# Patient Record
Sex: Female | Born: 1935 | Race: White | Hispanic: No | Smoking: Never smoker
Health system: Southern US, Community
[De-identification: ages and names within clinical notes are randomized; demographics above are authoritative.]

## PROBLEM LIST (undated history)

## (undated) DIAGNOSIS — K2100 Gastro-esophageal reflux disease with esophagitis, without bleeding: Secondary | ICD-10-CM

## (undated) DIAGNOSIS — N183 Chronic kidney disease, stage 3 unspecified: Secondary | ICD-10-CM

## (undated) DIAGNOSIS — G2581 Restless legs syndrome: Secondary | ICD-10-CM

## (undated) DIAGNOSIS — D126 Benign neoplasm of colon, unspecified: Secondary | ICD-10-CM

## (undated) DIAGNOSIS — E785 Hyperlipidemia, unspecified: Secondary | ICD-10-CM

## (undated) DIAGNOSIS — N390 Urinary tract infection, site not specified: Secondary | ICD-10-CM

## (undated) DIAGNOSIS — L659 Nonscarring hair loss, unspecified: Secondary | ICD-10-CM

## (undated) DIAGNOSIS — I10 Essential (primary) hypertension: Secondary | ICD-10-CM

## (undated) DIAGNOSIS — I428 Other cardiomyopathies: Secondary | ICD-10-CM

## (undated) DIAGNOSIS — D509 Iron deficiency anemia, unspecified: Secondary | ICD-10-CM

## (undated) DIAGNOSIS — K317 Polyp of stomach and duodenum: Secondary | ICD-10-CM

## (undated) DIAGNOSIS — K222 Esophageal obstruction: Secondary | ICD-10-CM

## (undated) DIAGNOSIS — I4891 Unspecified atrial fibrillation: Secondary | ICD-10-CM

## (undated) DIAGNOSIS — I509 Heart failure, unspecified: Secondary | ICD-10-CM

## (undated) DIAGNOSIS — K5730 Diverticulosis of large intestine without perforation or abscess without bleeding: Secondary | ICD-10-CM

## (undated) DIAGNOSIS — J45909 Unspecified asthma, uncomplicated: Secondary | ICD-10-CM

## (undated) DIAGNOSIS — R0602 Shortness of breath: Secondary | ICD-10-CM

## (undated) DIAGNOSIS — K219 Gastro-esophageal reflux disease without esophagitis: Secondary | ICD-10-CM

## (undated) DIAGNOSIS — M1990 Unspecified osteoarthritis, unspecified site: Secondary | ICD-10-CM

## (undated) DIAGNOSIS — Z950 Presence of cardiac pacemaker: Secondary | ICD-10-CM

## (undated) DIAGNOSIS — Z7901 Long term (current) use of anticoagulants: Secondary | ICD-10-CM

## (undated) HISTORY — DX: Diverticulosis of large intestine without perforation or abscess without bleeding: K57.30

## (undated) HISTORY — PX: CARDIAC DEFIBRILLATOR PLACEMENT: SHX171

## (undated) HISTORY — DX: Gastro-esophageal reflux disease with esophagitis: K21.0

## (undated) HISTORY — DX: Iron deficiency anemia, unspecified: D50.9

## (undated) HISTORY — PX: INSERT / REPLACE / REMOVE PACEMAKER: SUR710

## (undated) HISTORY — DX: Nonscarring hair loss, unspecified: L65.9

## (undated) HISTORY — DX: Polyp of stomach and duodenum: K31.7

## (undated) HISTORY — DX: Other cardiomyopathies: I42.8

## (undated) HISTORY — DX: Benign neoplasm of colon, unspecified: D12.6

## (undated) HISTORY — DX: Gastro-esophageal reflux disease with esophagitis, without bleeding: K21.00

## (undated) HISTORY — DX: Unspecified asthma, uncomplicated: J45.909

## (undated) HISTORY — DX: Unspecified osteoarthritis, unspecified site: M19.90

## (undated) HISTORY — DX: Heart failure, unspecified: I50.9

## (undated) HISTORY — DX: Restless legs syndrome: G25.81

## (undated) HISTORY — DX: Esophageal obstruction: K22.2

## (undated) HISTORY — DX: Unspecified atrial fibrillation: I48.91

## (undated) HISTORY — DX: Gastro-esophageal reflux disease without esophagitis: K21.9

## (undated) HISTORY — PX: JOINT REPLACEMENT: SHX530

---

## 1998-07-15 ENCOUNTER — Ambulatory Visit (HOSPITAL_COMMUNITY): Admission: RE | Admit: 1998-07-15 | Discharge: 1998-07-15 | Payer: Self-pay | Admitting: Cardiology

## 1998-12-29 ENCOUNTER — Other Ambulatory Visit: Admission: RE | Admit: 1998-12-29 | Discharge: 1998-12-29 | Payer: Self-pay | Admitting: Obstetrics and Gynecology

## 1999-01-19 ENCOUNTER — Ambulatory Visit (HOSPITAL_COMMUNITY): Admission: RE | Admit: 1999-01-19 | Discharge: 1999-01-19 | Payer: Self-pay | Admitting: Obstetrics and Gynecology

## 1999-01-19 ENCOUNTER — Encounter: Payer: Self-pay | Admitting: Obstetrics and Gynecology

## 1999-11-12 ENCOUNTER — Ambulatory Visit (HOSPITAL_COMMUNITY): Admission: RE | Admit: 1999-11-12 | Discharge: 1999-11-12 | Payer: Self-pay | Admitting: Obstetrics and Gynecology

## 1999-11-12 ENCOUNTER — Encounter (INDEPENDENT_AMBULATORY_CARE_PROVIDER_SITE_OTHER): Payer: Self-pay | Admitting: Specialist

## 1999-12-21 ENCOUNTER — Ambulatory Visit (HOSPITAL_COMMUNITY): Admission: RE | Admit: 1999-12-21 | Discharge: 1999-12-21 | Payer: Self-pay | Admitting: Obstetrics and Gynecology

## 1999-12-21 ENCOUNTER — Encounter: Payer: Self-pay | Admitting: Obstetrics and Gynecology

## 2000-03-13 HISTORY — PX: EYE SURGERY: SHX253

## 2002-02-21 ENCOUNTER — Encounter: Admission: RE | Admit: 2002-02-21 | Discharge: 2002-05-22 | Payer: Self-pay

## 2002-03-13 HISTORY — PX: TOTAL HIP ARTHROPLASTY: SHX124

## 2002-05-01 ENCOUNTER — Other Ambulatory Visit: Admission: RE | Admit: 2002-05-01 | Discharge: 2002-05-01 | Payer: Self-pay | Admitting: Obstetrics and Gynecology

## 2002-05-07 ENCOUNTER — Encounter: Payer: Self-pay | Admitting: Obstetrics and Gynecology

## 2002-05-07 ENCOUNTER — Ambulatory Visit (HOSPITAL_COMMUNITY): Admission: RE | Admit: 2002-05-07 | Discharge: 2002-05-07 | Payer: Self-pay | Admitting: Obstetrics and Gynecology

## 2002-07-16 ENCOUNTER — Encounter
Admission: RE | Admit: 2002-07-16 | Discharge: 2002-10-14 | Payer: Self-pay | Admitting: Physical Medicine & Rehabilitation

## 2003-01-10 ENCOUNTER — Inpatient Hospital Stay (HOSPITAL_COMMUNITY): Admission: EM | Admit: 2003-01-10 | Discharge: 2003-01-16 | Payer: Self-pay | Admitting: *Deleted

## 2003-01-13 ENCOUNTER — Encounter: Payer: Self-pay | Admitting: Orthopedic Surgery

## 2004-04-18 ENCOUNTER — Ambulatory Visit: Payer: Self-pay | Admitting: Internal Medicine

## 2004-04-29 ENCOUNTER — Ambulatory Visit (HOSPITAL_COMMUNITY): Admission: RE | Admit: 2004-04-29 | Discharge: 2004-04-29 | Payer: Self-pay | Admitting: Internal Medicine

## 2004-04-29 ENCOUNTER — Encounter (INDEPENDENT_AMBULATORY_CARE_PROVIDER_SITE_OTHER): Payer: Self-pay | Admitting: Specialist

## 2004-04-29 ENCOUNTER — Ambulatory Visit: Payer: Self-pay | Admitting: Internal Medicine

## 2004-04-29 DIAGNOSIS — K210 Gastro-esophageal reflux disease with esophagitis: Secondary | ICD-10-CM

## 2004-04-29 DIAGNOSIS — K5730 Diverticulosis of large intestine without perforation or abscess without bleeding: Secondary | ICD-10-CM | POA: Insufficient documentation

## 2004-04-29 DIAGNOSIS — D126 Benign neoplasm of colon, unspecified: Secondary | ICD-10-CM

## 2005-11-27 ENCOUNTER — Encounter: Admission: RE | Admit: 2005-11-27 | Discharge: 2005-11-27 | Payer: Self-pay | Admitting: Orthopedic Surgery

## 2005-11-28 ENCOUNTER — Encounter: Admission: RE | Admit: 2005-11-28 | Discharge: 2005-11-28 | Payer: Self-pay | Admitting: Orthopedic Surgery

## 2006-09-07 ENCOUNTER — Ambulatory Visit: Payer: Self-pay | Admitting: Internal Medicine

## 2006-12-11 ENCOUNTER — Ambulatory Visit: Payer: Self-pay | Admitting: Internal Medicine

## 2007-01-24 ENCOUNTER — Ambulatory Visit: Payer: Self-pay | Admitting: Internal Medicine

## 2007-02-12 ENCOUNTER — Ambulatory Visit (HOSPITAL_COMMUNITY): Admission: RE | Admit: 2007-02-12 | Discharge: 2007-02-12 | Payer: Self-pay | Admitting: Internal Medicine

## 2007-02-12 ENCOUNTER — Encounter: Payer: Self-pay | Admitting: Internal Medicine

## 2007-02-12 DIAGNOSIS — Z8719 Personal history of other diseases of the digestive system: Secondary | ICD-10-CM

## 2007-02-15 ENCOUNTER — Ambulatory Visit: Payer: Self-pay | Admitting: Internal Medicine

## 2007-04-30 DIAGNOSIS — J45909 Unspecified asthma, uncomplicated: Secondary | ICD-10-CM | POA: Insufficient documentation

## 2007-04-30 DIAGNOSIS — I5022 Chronic systolic (congestive) heart failure: Secondary | ICD-10-CM

## 2007-04-30 DIAGNOSIS — K219 Gastro-esophageal reflux disease without esophagitis: Secondary | ICD-10-CM | POA: Insufficient documentation

## 2007-04-30 DIAGNOSIS — G2581 Restless legs syndrome: Secondary | ICD-10-CM | POA: Insufficient documentation

## 2007-04-30 DIAGNOSIS — E785 Hyperlipidemia, unspecified: Secondary | ICD-10-CM

## 2007-04-30 DIAGNOSIS — I10 Essential (primary) hypertension: Secondary | ICD-10-CM | POA: Insufficient documentation

## 2007-04-30 DIAGNOSIS — K222 Esophageal obstruction: Secondary | ICD-10-CM

## 2007-04-30 DIAGNOSIS — E7800 Pure hypercholesterolemia, unspecified: Secondary | ICD-10-CM

## 2007-04-30 DIAGNOSIS — D509 Iron deficiency anemia, unspecified: Secondary | ICD-10-CM

## 2008-01-20 ENCOUNTER — Ambulatory Visit: Payer: Self-pay | Admitting: Internal Medicine

## 2008-03-20 ENCOUNTER — Emergency Department (HOSPITAL_COMMUNITY): Admission: EM | Admit: 2008-03-20 | Discharge: 2008-03-21 | Payer: Self-pay | Admitting: Emergency Medicine

## 2008-04-03 ENCOUNTER — Encounter: Admission: RE | Admit: 2008-04-03 | Discharge: 2008-07-02 | Payer: Self-pay | Admitting: Internal Medicine

## 2008-04-14 ENCOUNTER — Encounter: Payer: Self-pay | Admitting: Internal Medicine

## 2008-04-30 ENCOUNTER — Encounter: Admission: RE | Admit: 2008-04-30 | Discharge: 2008-04-30 | Payer: Self-pay | Admitting: Internal Medicine

## 2008-05-26 ENCOUNTER — Ambulatory Visit: Payer: Self-pay | Admitting: Internal Medicine

## 2008-05-26 ENCOUNTER — Encounter: Payer: Self-pay | Admitting: Internal Medicine

## 2008-05-26 DIAGNOSIS — I428 Other cardiomyopathies: Secondary | ICD-10-CM

## 2008-05-26 DIAGNOSIS — I472 Ventricular tachycardia: Secondary | ICD-10-CM

## 2008-05-26 DIAGNOSIS — I4892 Unspecified atrial flutter: Secondary | ICD-10-CM

## 2008-08-27 ENCOUNTER — Ambulatory Visit: Payer: Self-pay | Admitting: Internal Medicine

## 2008-09-14 ENCOUNTER — Encounter: Payer: Self-pay | Admitting: Internal Medicine

## 2008-11-27 ENCOUNTER — Ambulatory Visit: Payer: Self-pay | Admitting: Internal Medicine

## 2008-11-27 DIAGNOSIS — Z950 Presence of cardiac pacemaker: Secondary | ICD-10-CM

## 2009-04-02 ENCOUNTER — Ambulatory Visit: Payer: Self-pay | Admitting: Internal Medicine

## 2009-07-01 ENCOUNTER — Ambulatory Visit: Payer: Self-pay | Admitting: Internal Medicine

## 2009-08-05 ENCOUNTER — Ambulatory Visit: Payer: Self-pay | Admitting: Internal Medicine

## 2009-09-08 ENCOUNTER — Telehealth (INDEPENDENT_AMBULATORY_CARE_PROVIDER_SITE_OTHER): Payer: Self-pay | Admitting: *Deleted

## 2009-11-04 ENCOUNTER — Ambulatory Visit: Payer: Self-pay | Admitting: Internal Medicine

## 2009-11-05 ENCOUNTER — Ambulatory Visit: Payer: Self-pay | Admitting: Cardiology

## 2009-11-08 ENCOUNTER — Ambulatory Visit: Payer: Self-pay | Admitting: Cardiology

## 2009-11-08 ENCOUNTER — Ambulatory Visit (HOSPITAL_COMMUNITY): Admission: RE | Admit: 2009-11-08 | Discharge: 2009-11-08 | Payer: Self-pay | Admitting: Cardiology

## 2009-11-19 ENCOUNTER — Encounter: Payer: Self-pay | Admitting: Internal Medicine

## 2010-02-14 ENCOUNTER — Ambulatory Visit: Payer: Self-pay | Admitting: Cardiology

## 2010-04-12 NOTE — Assessment & Plan Note (Signed)
Summary: pc2/DMP  Medications Added WARFARIN SODIUM 4 MG TABS (WARFARIN SODIUM) Use as directed by Anticoagulation Clinic WARFARIN SODIUM 4 MG TABS (WARFARIN SODIUM) Use as directed by Anticoagulation Clinic        Visit Type:  Follow-up Primary Provider:  Dr.Patterson   History of Present Illness: [name] returns today for followup.  She has a longstanding DCM with class 2-3 CHF.  She also has a h/o VT and has been well controlled on Sotalol.  The patient has not had any ICD therapies since we last saw her but does note chronic dyspnea.  No C/P or Peripheral edema. She denies any recent hospitalizations.  Current Medications (verified): 1)  Toprol Xl 25 Mg Xr24h-Tab (Metoprolol Succinate) .... Take 1/3 Tablet Once Daily 2)  Furosemide 20 Mg Tabs (Furosemide) .... Take Two Tablets By Mouth in The Am and One in The Pm 3)  Sotalol Hcl 80 Mg Tabs (Sotalol Hcl) .... Take One Tablet By Mouth Twice A Day 4)  Clarinex 5 Mg Tabs (Desloratadine) .... Take 2 Tablet Once Daily 5)  Prilosec 20 Mg Cpdr (Omeprazole) .Marland Kitchen.. 1 Capsule Once Daily 6)  Warfarin Sodium 4 Mg Tabs (Warfarin Sodium) .... Use As Directed By Anticoagulation Clinic 7)  Levothroid 75 Mcg Tabs (Levothyroxine Sodium) .Marland Kitchen.. 1 Tablet Once Daily 8)  Aspirin 81 Mg Tbec (Aspirin) .... Take One Tablet By Mouth Daily 9)  Ferrous Sulfate 325 (65 Fe) Mg Tbec (Ferrous Sulfate) .Marland Kitchen.. 1 Tablet By Mouth Once Daily 10)  Fish Oil  Oil (Fish Oil) .... Once Daily 11)  Multivitamins  Tabs (Multiple Vitamin) .... By Mouth Once Daily 12)  Avapro 75 Mg Tabs (Irbesartan) .... Take One Tablet By Mouth Daily 13)  Warfarin Sodium 4 Mg Tabs (Warfarin Sodium) .... Use As Directed By Anticoagulation Clinic  Allergies: 1)  ! * Ivp Dye 2)  ! * No Shellfish Allergy 3)  ! * No Latex Allergy  Past History:  Past Medical History: Last updated: 05/26/2008 POLYP, COLON (ICD-211.3) DIVERTICULOSIS, COLON (ICD-562.10) ESOPHAGITIS, REFLUX (ICD-530.11) GASTRIC  POLYP, HX OF (ICD-V12.79) GERD (ICD-530.81) ESOPHAGEAL STRICTURE (ICD-530.3) REACTIVE AIRWAY DISEASE (ICD-493.90) DYSLIPIDEMIA (ICD-272.4) ANEMIA, IRON DEFICIENCY (ICD-280.9) CHF (ICD-428.0) RESTLESS LEG SYNDROME (ICD-333.94) HYPERCHOLESTEROLEMIA (ICD-272.0) HYPERTENSION (ICD-401.9) Nonischemic cardiomyopathy, ejection fraction 20%.  History of sudden cardiac death status post implantable       cardioverter-defibrillator.  Atrial Fibrillation Diabetes Osteoarthritis.  Past Surgical History: Last updated: 04/30/2007 Defibrillator Placement (1999, 2001) Cataract Surgery (2002) Right Hip Replacement (2004)  Review of Systems       The patient complains of dyspnea on exertion.  The patient denies chest pain, syncope, and peripheral edema.    Vital Signs:  Patient profile:   77 year old female Height:      64 inches Pulse rate:   90 / minute BP sitting:   110 / 70  (left arm)  Vitals Entered By: [name] CMA (April 02, 2009 4:40 PM)  Physical Exam  General:  Well developed, well nourished, in no acute distress. Head:  normocephalic and atraumatic Eyes:  PERRLA/EOM intact; conjunctiva and lids normal. Mouth:  Teeth, gums and palate normal. Oral mucosa normal. Neck:  Neck supple, no JVD. No masses, thyromegaly or abnormal cervical nodes. Chest Wall:  Well healed ICD incision. Lungs:  Clear bilaterally to auscultation except for basilar rales.  No wheezes or rhonchi or increased work of breathing. Heart:  RRR with normal S1 and S2.  PMI is enlarged and laterally displaced.  No murmurs. Abdomen:  Obese, NT, ND.  No HSM, rebound or guarding. Msk:  Back normal, normal gait. Muscle strength and tone normal. Pulses:  pulses normal in all 4 extremities Extremities:  No clubbing or cyanosis. Neurologic:  Alert and oriented x 3.    ICD Specifications Following MD:  Lewayne Bunting, MD     ICD Vendor:  [facility] [employer]     ICD Model Number:  H215     ICD Serial Number:   [id number] ICD DOI:  10/17/2004     ICD Implanting MD:  NOT IMPLANTED HERE  Lead 1:    Location: RA     DOI: 10/17/2004     Model #: [id number]     Serial #: [id number]     Status: active Lead 2:    Location: RV     DOI: 10/17/2004     Model #: [id number]     Serial #: [id number]     Status: active Lead 3:    Location: LV     DOI: 10/17/2004     Model #: [id number]     Serial #: [id number]     Status: active  Indications::  CHF, LV DYSFUNCTION   ICD Follow Up Remote Check?  No Battery Voltage:  2.57 V     Charge Time:  7.7 seconds     ICD Dependent:  Yes       ICD Device Measurements Atrium:  Amplitude: 3.3 mV, Impedance: 401 ohms, Threshold: 0.6 V at 0.5 msec Right Ventricle:  Amplitude: 24.1 mV, Impedance: 536 ohms, Threshold: 0.5 V at 0.4 msec Left Ventricle:  Amplitude: 13.6 mV, Impedance: 659 ohms, Threshold: 1.0 V at 0.5 msec Shock Impedance: 54 ohms   Episodes MS Episodes:  2.0K     Percent Mode Switch:  9%     Coumadin:  Yes Shock:  0     ATP:  0     Nonsustained:  0     Atrial Pacing:  63%     Ventricular Pacing:  78%  Brady Parameters Mode DDDR     Lower Rate Limit:  70     Upper Rate Limit 110 PAV 140     Sensed AV Delay:  100  Tachy Zones VF:  200     VT:  170     Next Remote Date:  07/01/2009     Next Cardiology Appt Due:  03/13/2010 Tech Comments:  No parameter changes.  Device function normal.  Latitude every 3 months ROV 1 year Dr. Ladona Ridgel. Altha Harm, LPN  April 02, 2009 4:53 PM  MD Comments:  Agree with above. Normal device function.  Impression & Recommendations:  Problem # 1:  AUTOMATIC IMPLANTABLE CARDIAC DEFIBRILLATOR SITU (ICD-V45.02) Her device is working normally.  Will recheck in several months.  Problem # 2:  PAROXYSMAL VENTRICULAR TACHYCARDIA (ICD-427.1) No additional VT since last followup.  Will continue her current meds. Her updated medication list for this problem includes:    Toprol Xl 25 Mg Xr24h-tab (Metoprolol succinate) .Marland Kitchen... Take 1/3 tablet once daily    Sotalol Hcl  80 Mg Tabs (Sotalol hcl) .Marland Kitchen... Take one tablet by mouth twice a day    Warfarin Sodium 4 Mg Tabs (Warfarin sodium) ..... Use as directed by anticoagulation clinic    Aspirin 81 Mg Tbec (Aspirin) .Marland Kitchen... Take one tablet by mouth daily  Problem # 3:  ATRIAL FIBRILLATION (ICD-427.31) She has been out of rhythm approximately 9% of the time without symptoms. Her updated medication list for this problem  includes:    Toprol Xl 25 Mg Xr24h-tab (Metoprolol succinate) .Marland Kitchen... Take 1/3 tablet once daily    Sotalol Hcl 80 Mg Tabs (Sotalol hcl) .Marland Kitchen... Take one tablet by mouth twice a day    Warfarin Sodium 4 Mg Tabs (Warfarin sodium) ..... Use as directed by anticoagulation clinic    Aspirin 81 Mg Tbec (Aspirin) .Marland Kitchen... Take one tablet by mouth daily  Problem # 4:  CHF (ICD-428.0) Her chronic systolic heart failure is well controlled.  A low sodium diet is recommended. Her updated medication list for this problem includes:    Toprol Xl 25 Mg Xr24h-tab (Metoprolol succinate) .Marland Kitchen... Take 1/3 tablet once daily    Furosemide 20 Mg Tabs (Furosemide) .Marland Kitchen... Take two tablets by mouth in the am and one in the pm    Sotalol Hcl 80 Mg Tabs (Sotalol hcl) .Marland Kitchen... Take one tablet by mouth twice a day    Warfarin Sodium 4 Mg Tabs (Warfarin sodium) ..... Use as directed by anticoagulation clinic    Aspirin 81 Mg Tbec (Aspirin) .Marland Kitchen... Take one tablet by mouth daily    Avapro 75 Mg Tabs (Irbesartan) .Marland Kitchen... Take one tablet by mouth daily  Patient Instructions: 1)  Your physician recommends that you schedule a follow-up appointment in: 12 months with Dr Ladona Ridgel

## 2010-04-12 NOTE — Letter (Signed)
Summary: Remote Device Check  [facility], Main Office  [address]   [place], [place] [address]   Phone: [phone]  Fax: [phone]     November 19, 2009 MRN: [id number]   [name] [address] [place], [place]  [address]   Dear Ms. MACARI,   Your remote transmission was recieved and reviewed by your physician.  All diagnostics were within normal limits for you.   __X____Your next office visit is scheduled for:  January 2012 with Dr Ladona Ridgel. Please call our office to schedule an appointment.    Sincerely,  Vella Kohler

## 2010-04-12 NOTE — Cardiovascular Report (Signed)
Summary: Office Visit   Office Visit   Imported By: Roderic Ovens 04/08/2009 14:02:34  _____________________________________________________________________  External Attachment:    Type:   Image     Comment:   External Document

## 2010-04-12 NOTE — Cardiovascular Report (Signed)
Summary: Office Visit Remote   Office Visit Remote   Imported By: Roderic Ovens 08/26/2009 16:12:24  _____________________________________________________________________  External Attachment:    Type:   Image     Comment:   External Document

## 2010-04-12 NOTE — Cardiovascular Report (Signed)
Summary: Office Visit Remote   Office Visit Remote   Imported By: Roderic Ovens 07/26/2009 15:32:19  _____________________________________________________________________  External Attachment:    Type:   Image     Comment:   External Document

## 2010-04-12 NOTE — Cardiovascular Report (Signed)
Summary: Office Visit Remote   Office Visit Remote   Imported By: Roderic Ovens 11/23/2009 10:01:13  _____________________________________________________________________  External Attachment:    Type:   Image     Comment:   External Document

## 2010-04-12 NOTE — Progress Notes (Signed)
   Release of Information Sent to PT via Mail [facility]  September 08, 2009 11:30 AM

## 2010-04-19 ENCOUNTER — Encounter (INDEPENDENT_AMBULATORY_CARE_PROVIDER_SITE_OTHER): Payer: Self-pay | Admitting: *Deleted

## 2010-04-28 NOTE — Letter (Signed)
Summary: Appointment - Reminder 2  [facility], Main Office  [address]   [place], [place] [address]   Phone: [phone]  Fax: [phone]     April 19, 2010 MRN: [id number]   [name] [address] [place], [place]  [address]   Dear Ms. SPILLMAN,  Our records indicate that it is past time to schedule a follow-up appointment. Dr.Bubeck recommended that you follow up with [place] in January. It is very important that we reach you to schedule this appointment. We look forward to participating in your health care needs. Please contact us at the number listed above at your earliest convenience to schedule your appointment.  If you are unable to make an appointment at this time, give [place] a call so we can update our records.     Sincerely,   Glass blower/designer

## 2010-05-05 ENCOUNTER — Encounter (INDEPENDENT_AMBULATORY_CARE_PROVIDER_SITE_OTHER): Payer: Medicare Other

## 2010-05-05 DIAGNOSIS — I428 Other cardiomyopathies: Secondary | ICD-10-CM

## 2010-05-12 ENCOUNTER — Other Ambulatory Visit: Payer: Self-pay | Admitting: Internal Medicine

## 2010-05-26 ENCOUNTER — Encounter (INDEPENDENT_AMBULATORY_CARE_PROVIDER_SITE_OTHER): Payer: Medicare Other | Admitting: Internal Medicine

## 2010-05-26 ENCOUNTER — Encounter: Payer: Self-pay | Admitting: Internal Medicine

## 2010-05-26 DIAGNOSIS — I5022 Chronic systolic (congestive) heart failure: Secondary | ICD-10-CM

## 2010-05-26 DIAGNOSIS — I472 Ventricular tachycardia: Secondary | ICD-10-CM

## 2010-05-27 ENCOUNTER — Telehealth: Payer: Self-pay | Admitting: Internal Medicine

## 2010-05-31 ENCOUNTER — Telehealth: Payer: Self-pay | Admitting: Internal Medicine

## 2010-05-31 NOTE — Progress Notes (Signed)
Summary: question re pt dr visit yesterday  Phone Note Call from Patient Call back at [phone]    Caller: Daughter/jackie [phone] Reason for Call: Talk to Nurse Summary of Call: pt daughter would like to talk to a nurse re pt office visit yesterday re device. Initial call taken by: Roe Coombs,  May 27, 2010 10:57 AM  Follow-up for Phone Call        Spoke with daughter who is asking about planned procedure and reason for change.  I explained to daughter that device had reached ERI and that pt would like to proceed with pacemaker replacement but not ICD replacement.  Also, went over instructions with daughter.  She will call back if additional questions. Follow-up by: Dossie Arbour, RN, BSN,  May 27, 2010 11:36 AM

## 2010-05-31 NOTE — Letter (Signed)
Summary: Implantable Device Instructions  [facility], Main Office  [address]   [place], [place] [address]   Phone: [phone]  Fax: [phone]      Implantable Device Instructions  You are scheduled for:  ___X__ Permanent Transvenous Pacemaker _____ Implantable Cardioverter Defibrillator _____ Implantable Loop Recorder _____ Generator Change  on __3/30/12___ with Dr. __TAYLOR___.  1.  Please arrive at the Short Stay Center at [facility] at __9:30 AM___ on the day of your procedure.  2.  Do not eat or drink the night before your procedure.  3.  Complete lab work on __3/26/12 2:30 PM___.  The lab at [facility] is open from 8:30 AM to 1:30 PM and from 2:30 PM to 5:00 PM.  The lab at [facility] is open from 7:30 AM to 5:30 PM.  You do not have to be fasting.  4.  Do NOT take these medications for  HOLD FUROSEMIDE 20 MG AM OF PROCEDURE  5.  Plan for an overnight stay.  Bring your insurance cards and a list of your medications.  6.  Wash your chest and neck with antibacterial soap (any brand) the evening before and the morning of your procedure.  Rinse well.    *If you have ANY questions after you get home, please call the office [phone].  *Every attempt is made to prevent procedures from being rescheduled.  Due to the nauture of Electrophysiology, rescheduling can happen.  The physician is always aware and directs the staff when this occurs.

## 2010-05-31 NOTE — Assessment & Plan Note (Signed)
Summary: phys pacer ck/battery reached eri/mt  Medications Added FUROSEMIDE 20 MG TABS (FUROSEMIDE) Take 1 tablet by mouth once a day . 2nd. as needed CLARINEX 5 MG TABS (DESLORATADINE) Take 1 tablet by mouth once a day PRILOSEC 20 MG CPDR (OMEPRAZOLE) as needed TYLENOL EXTRA STRENGTH 500 MG TABS (ACETAMINOPHEN) as needed CREON 12000 UNIT CPEP (PANCRELIPASE (LIP-PROT-AMYL)) Take 1 capsule by mouth three times a day        Visit Type:  Follow-up Primary Provider:  Dr.Patterson  CC:  Device check.  History of Present Illness: [name] returns today for followup.  She has a longstanding DCM with class 2-3 CHF.  She also has a h/o VT and has been well controlled on Sotalol.  The patient has not had any ICD therapies since we last saw her but does note chronic dyspnea.  No C/P or Peripheral edema. She denies any recent hospitalizations. She is not certain she would like to have an additional ICD at her advanced age.  Current Medications (verified): 1)  Toprol Xl 25 Mg Xr24h-Tab (Metoprolol Succinate) .... Take 1/3 Tablet Once Daily 2)  Furosemide 20 Mg Tabs (Furosemide) .... Take 1 Tablet By Mouth Once A Day . 2nd. As Needed 3)  Sotalol Hcl 80 Mg Tabs (Sotalol Hcl) .... Take One Tablet By Mouth Twice A Day 4)  Clarinex 5 Mg Tabs (Desloratadine) .... Take 1 Tablet By Mouth Once A Day 5)  Prilosec 20 Mg Cpdr (Omeprazole) .... As Needed 6)  Warfarin Sodium 4 Mg Tabs (Warfarin Sodium) .... Use As Directed By Anticoagulation Clinic 7)  Levothroid 75 Mcg Tabs (Levothyroxine Sodium) .Marland Kitchen.. 1 Tablet Once Daily 8)  Aspirin 81 Mg Tbec (Aspirin) .... Take One Tablet By Mouth Daily 9)  Ferrous Sulfate 325 (65 Fe) Mg Tbec (Ferrous Sulfate) .Marland Kitchen.. 1 Tablet By Mouth Once Daily 10)  Fish Oil  Oil (Fish Oil) .... Once Daily 11)  Multivitamins  Tabs (Multiple Vitamin) .... By Mouth Once Daily 12)  Avapro 75 Mg Tabs (Irbesartan) .... Take One Tablet By Mouth Daily 13)  Tylenol Extra Strength 500 Mg Tabs  (Acetaminophen) .... As Needed 14)  Creon 12000 Unit Cpep (Pancrelipase (Lip-Prot-Amyl)) .... Take 1 Capsule By Mouth Three Times A Day  Past History:  Past Medical History: Last updated: 05/26/2008 POLYP, COLON (ICD-211.3) DIVERTICULOSIS, COLON (ICD-562.10) ESOPHAGITIS, REFLUX (ICD-530.11) GASTRIC POLYP, HX OF (ICD-V12.79) GERD (ICD-530.81) ESOPHAGEAL STRICTURE (ICD-530.3) REACTIVE AIRWAY DISEASE (ICD-493.90) DYSLIPIDEMIA (ICD-272.4) ANEMIA, IRON DEFICIENCY (ICD-280.9) CHF (ICD-428.0) RESTLESS LEG SYNDROME (ICD-333.94) HYPERCHOLESTEROLEMIA (ICD-272.0) HYPERTENSION (ICD-401.9) Nonischemic cardiomyopathy, ejection fraction 20%.  History of sudden cardiac death status post implantable       cardioverter-defibrillator.  Atrial Fibrillation Diabetes Osteoarthritis.  Past Surgical History: Last updated: 04/30/2007 Defibrillator Placement (1999, 2001) Cataract Surgery (2002) Right Hip Replacement (2004)  Review of Systems       The patient complains of dyspnea on exertion.  The patient denies chest pain, syncope, and peripheral edema.    Vital Signs:  Patient profile:   77 year old female Height:      64 inches Weight:      176.50 pounds BMI:     30.41 Pulse rate:   64 / minute Pulse rhythm:   irregular Resp:     18 per minute BP sitting:   124 / 72  (right arm) Cuff size:   large  Vitals Entered By: Vikki Ports (May 26, 2010 12:18 PM)  Physical Exam  General:  Well developed, well nourished, in no acute distress. Head:  normocephalic and atraumatic Eyes:  PERRLA/EOM intact; conjunctiva and lids normal. Mouth:  Teeth, gums and palate normal. Oral mucosa normal. Neck:  Neck supple, no JVD. No masses, thyromegaly or abnormal cervical nodes. Chest Wall:  Well healed ICD incision. Lungs:  Clear bilaterally to auscultation except for basilar rales.  No wheezes or rhonchi or increased work of breathing. Heart:  RRR with normal S1 and S2.  PMI is enlarged and  laterally displaced.  No murmurs. Abdomen:  Obese, NT, ND.  No HSM, rebound or guarding. Msk:  Back normal, normal gait. Muscle strength and tone normal. Pulses:  pulses normal in all 4 extremities Extremities:  No clubbing or cyanosis. Neurologic:  Alert and oriented x 3.    ICD Specifications Following MD:  Lewayne Bunting, MD     ICD Vendor:  [facility] [employer]     ICD Model Number:  H215     ICD Serial Number:  [id number] ICD DOI:  10/17/2004     ICD Implanting MD:  NOT IMPLANTED HERE  Lead 1:    Location: RA     DOI: 10/17/2004     Model #: [id number]     Serial #: [id number]     Status: active Lead 2:    Location: RV     DOI: 10/17/2004     Model #: [id number]     Serial #: [id number]     Status: active Lead 3:    Location: LV     DOI: 10/17/2004     Model #: 4512     Serial #: [id number]     Status: active  Indications::  CHF, LV DYSFUNCTION   ICD Follow Up ICD Dependent:  Yes      Episodes Coumadin:  Yes  Brady Parameters Mode DDDR     Lower Rate Limit:  70     Upper Rate Limit 110 PAV 140     Sensed AV Delay:  100  Tachy Zones VF:  200     VT:  170     MD Comments:  Her device is at ERI.  Impression & Recommendations:  Problem # 1:  AUTOMATIC IMPLANTABLE CARDIAC DEFIBRILLATOR SITU (ICD-V45.02) Her device has reached ERI. I have discussed the treatment options with her. She does not want to have another ICD. I have recommended we replace with a BiV PPM. She is considering her options.  Problem # 2:  PAROXYSMAL VENTRICULAR TACHYCARDIA (ICD-427.1) Her symptoms have been well controlled with her meds as below. The following medications were removed from the medication list:    Warfarin Sodium 4 Mg Tabs (Warfarin sodium) ..... Use as directed by anticoagulation clinic Her updated medication list for this problem includes:    Toprol Xl 25 Mg Xr24h-tab (Metoprolol succinate) .Marland Kitchen... Take 1/3 tablet once daily    Sotalol Hcl 80 Mg Tabs (Sotalol hcl) .Marland Kitchen... Take one tablet by mouth twice a day    Warfarin  Sodium 4 Mg Tabs (Warfarin sodium) ..... Use as directed by anticoagulation clinic    Aspirin 81 Mg Tbec (Aspirin) .Marland Kitchen... Take one tablet by mouth daily  Problem # 3:  CHF (ICD-428.0) Her chronic systolic heart failure is currently well compensated The following medications were removed from the medication list:    Warfarin Sodium 4 Mg Tabs (Warfarin sodium) ..... Use as directed by anticoagulation clinic Her updated medication list for this problem includes:    Toprol Xl 25 Mg Xr24h-tab (Metoprolol succinate) .Marland Kitchen... Take 1/3 tablet once daily    Furosemide  20 Mg Tabs (Furosemide) .Marland Kitchen... Take 1 tablet by mouth once a day . 2nd. as needed    Sotalol Hcl 80 Mg Tabs (Sotalol hcl) .Marland Kitchen... Take one tablet by mouth twice a day    Warfarin Sodium 4 Mg Tabs (Warfarin sodium) ..... Use as directed by anticoagulation clinic    Aspirin 81 Mg Tbec (Aspirin) .Marland Kitchen... Take one tablet by mouth daily    Avapro 75 Mg Tabs (Irbesartan) .Marland Kitchen... Take one tablet by mouth daily  Patient Instructions: 1)  Your physician recommends that you schedule a follow-up appointment in: AFTER  PROCEDURE 2)  Your physician recommends that you continue on your current medications as directed. Please refer to the Current Medication list given to you today.

## 2010-05-31 NOTE — Telephone Encounter (Signed)
Returned call-Patient states she remembered she had a reaction to dye 20 years ago. This nurse listed that allergy in chart.  Also patient had question about holding Warfarin for procedure she is going to have next week.  Per Tresa Endo, Dr. Lubertha Basque nurse, she does not have to hold this medication.  Patient informed.

## 2010-05-31 NOTE — Telephone Encounter (Signed)
Pt states she is allergic to dye.  Pt wanted to be place in her chart. Pt also has question re coumadin.

## 2010-06-06 ENCOUNTER — Other Ambulatory Visit (INDEPENDENT_AMBULATORY_CARE_PROVIDER_SITE_OTHER): Payer: Medicare Other | Admitting: *Deleted

## 2010-06-06 DIAGNOSIS — I4891 Unspecified atrial fibrillation: Secondary | ICD-10-CM

## 2010-06-06 DIAGNOSIS — Z01810 Encounter for preprocedural cardiovascular examination: Secondary | ICD-10-CM

## 2010-06-06 DIAGNOSIS — I428 Other cardiomyopathies: Secondary | ICD-10-CM

## 2010-06-06 LAB — BASIC METABOLIC PANEL
CO2: 27 mEq/L (ref 19–32)
Chloride: 100 mEq/L (ref 96–112)
Sodium: 136 mEq/L (ref 135–145)

## 2010-06-06 LAB — CBC WITH DIFFERENTIAL/PLATELET
Basophils Relative: 0.3 % (ref 0.0–3.0)
Eosinophils Absolute: 0.1 10*3/uL (ref 0.0–0.7)
Hemoglobin: 12.9 g/dL (ref 12.0–15.0)
Lymphs Abs: 1.7 10*3/uL (ref 0.7–4.0)
MCHC: 34.2 g/dL (ref 30.0–36.0)
MCV: 102.8 fl — ABNORMAL HIGH (ref 78.0–100.0)
Monocytes Absolute: 0.8 10*3/uL (ref 0.1–1.0)
Neutro Abs: 4.8 10*3/uL (ref 1.4–7.7)
RBC: 3.68 Mil/uL — ABNORMAL LOW (ref 3.87–5.11)

## 2010-06-06 LAB — PROTIME-INR: INR: 2.6 ratio — ABNORMAL HIGH (ref 0.8–1.0)

## 2010-06-10 ENCOUNTER — Ambulatory Visit (HOSPITAL_COMMUNITY)
Admission: RE | Admit: 2010-06-10 | Discharge: 2010-06-10 | Disposition: A | Discharge status: Home / Self Care | Payer: Medicare Other | Source: Ambulatory Visit | Attending: Internal Medicine | Admitting: Internal Medicine

## 2010-06-10 ENCOUNTER — Ambulatory Visit (HOSPITAL_COMMUNITY): Payer: Medicare Other

## 2010-06-10 DIAGNOSIS — I428 Other cardiomyopathies: Secondary | ICD-10-CM

## 2010-06-10 DIAGNOSIS — Z4502 Encounter for adjustment and management of automatic implantable cardiac defibrillator: Secondary | ICD-10-CM | POA: Insufficient documentation

## 2010-06-10 DIAGNOSIS — I509 Heart failure, unspecified: Secondary | ICD-10-CM

## 2010-06-10 LAB — SURGICAL PCR SCREEN

## 2010-06-13 ENCOUNTER — Ambulatory Visit (INDEPENDENT_AMBULATORY_CARE_PROVIDER_SITE_OTHER): Payer: Medicare Other | Admitting: *Deleted

## 2010-06-13 ENCOUNTER — Telehealth: Payer: Self-pay | Admitting: Internal Medicine

## 2010-06-13 DIAGNOSIS — I4891 Unspecified atrial fibrillation: Secondary | ICD-10-CM

## 2010-06-13 NOTE — Telephone Encounter (Signed)
lmom to call me back and will try another number Called the [phone] number and left message at that number also

## 2010-06-13 NOTE — Progress Notes (Signed)
Pt seen in office for wound check.  Pt had gen change on 06-10-10 by Dr Ladona Ridgel.  Site is swollen and bruised.  Pt is on Coumadin.  Pt to have INR checked today per Dr Johney Frame.  Dr Johney Frame assessed the site. Pt to call if site gets worse.  Plan per Dr Johney Frame after INR is drawn.

## 2010-06-13 NOTE — Telephone Encounter (Addendum)
dtr Arna Medici states pt complaining of  "needle like sharpness" in the incision, as well as swelling and redness, is this normal? dtr Arna Medici called back can be reached now at [phone]

## 2010-06-14 NOTE — Telephone Encounter (Signed)
lmom again today

## 2010-06-14 NOTE — Assessment & Plan Note (Signed)
Summary: allergies

## 2010-06-16 NOTE — Telephone Encounter (Signed)
lmom again today for pt and let her know if she has any further problems

## 2010-06-17 NOTE — Op Note (Signed)
  NAME:  [name], [name]                 ACCOUNT NO.:  [id number]  MEDICAL RECORD NO.:  [id number]           PATIENT TYPE:  O  LOCATION:  [facility]                         FACILITY:  [facility]  PHYSICIAN:  Doylene Canning. Ladona Ridgel, MD    DATE OF BIRTH:  03/12/1927  DATE OF PROCEDURE:  06/10/2010 DATE OF DISCHARGE:  06/10/2010                              OPERATIVE REPORT   PROCEDURE PERFORMED:  Removal of previously implanted BiV ICD followed by insertion of BiV pacemaker with pacemaker pocket revision.  INTRODUCTION:  The patient is an 77 year old woman with a longstanding cardiomyopathy, congestive heart failure.  She has a remote history of VT.  She is status post BiV ICD insertion, which has reached elective replacement indication.  She is now referred for ICD generator change out with removal of her device to a new BiV pacemaker.  PROCEDURE IN DETAIL:  After informed consent was obtained, the patient was taken to the Diagnostic EP Lab in fasting state.  After usual preparation and draping, intravenous fentanyl and midazolam was given for sedation.  30 mL of lidocaine was infiltrated into the left infraclavicular region.  A 5-cm incision was carried out over this region.  Electrocautery was utilized to dissect down to the fascial plane.  The ICD pocket was entered with electrocautery and the generator was removed with gentle traction without difficulty.  The leads were freed up from their fibrous adhesions.  At this point, the old [employer] BiV ICD was removed and a new [employer]product [employer] TR BiV pacemaker serial number [id number] was connected to the old atrial RV and LV leads.  The leads were working satisfactorily. Following this, the pocket was revised as the can itself had shifted down inferiorly and caudally and laterally, and it was moved more medially and superiorly.  It secured with a silk suture.  The pocket was irrigated.  The incision was closed with 2-0 and 3-0 Vicryl.   Benzoin and Steri-Strips were painted on the skin.  A pressure dressing was applied, and the patient was returned to her room in satisfactory condition.  COMPLICATION:  There were no immediate procedure complications.  RESULTS:  This demonstrate successful removal of a previously implanted BiV ICD, insertion of new BiV pacemaker without immediate procedural complications.     Doylene Canning. Ladona Ridgel, MD     GWT/MEDQ  D:  06/10/2010  T:  06/11/2010  Job:  [id number]  Electronically Signed by Lewayne Bunting MD on 06/17/2010 06:48:18 AM

## 2010-06-20 ENCOUNTER — Ambulatory Visit (INDEPENDENT_AMBULATORY_CARE_PROVIDER_SITE_OTHER): Payer: Medicare Other | Admitting: *Deleted

## 2010-06-20 DIAGNOSIS — T148XXA Other injury of unspecified body region, initial encounter: Secondary | ICD-10-CM

## 2010-06-20 DIAGNOSIS — I4891 Unspecified atrial fibrillation: Secondary | ICD-10-CM

## 2010-06-20 DIAGNOSIS — I509 Heart failure, unspecified: Secondary | ICD-10-CM

## 2010-06-20 LAB — CBC WITH DIFFERENTIAL/PLATELET
Eosinophils Relative: 1.3 % (ref 0.0–5.0)
HCT: 36 % (ref 36.0–46.0)
Hemoglobin: 12.1 g/dL (ref 12.0–15.0)
Lymphs Abs: 1.8 10*3/uL (ref 0.7–4.0)
Monocytes Relative: 6.3 % (ref 3.0–12.0)
Platelets: 251 10*3/uL (ref 150.0–400.0)
WBC: 9.9 10*3/uL (ref 4.5–10.5)

## 2010-06-20 NOTE — Progress Notes (Signed)
Addended by: Gypsy Balsam on: 06/20/2010 03:06 PM   Modules accepted: Orders

## 2010-06-21 LAB — GLUCOSE, CAPILLARY: Glucose-Capillary: 92 mg/dL (ref 70–99)

## 2010-06-23 ENCOUNTER — Encounter: Payer: Self-pay | Admitting: Internal Medicine

## 2010-06-23 ENCOUNTER — Telehealth: Payer: Self-pay | Admitting: Internal Medicine

## 2010-06-23 NOTE — Telephone Encounter (Signed)
Pt daughter states pt has a knot and a lot of sweeling around site. Pt daughter would like to talk to a nurse. Pt has an appt tomorrow.

## 2010-06-23 NOTE — Telephone Encounter (Signed)
I spoke with daughter, Annice Pih, who is concerned about her mother's pacer site swelling.  She states it also looks "inflamed". Pt does not have a fever, no drainage from site. Also reports incisional area swollen "raised 2-3 inches" at site.  Her mother does have discomfort and will take tylenol. There is no swelling in the neck or shoulder area according to daughter. Pt has appt. With Dr. Graciela Husbands tomorrow.  I will forward for Dr. Graciela Husbands to review.

## 2010-06-24 ENCOUNTER — Ambulatory Visit (INDEPENDENT_AMBULATORY_CARE_PROVIDER_SITE_OTHER): Payer: Medicare Other | Admitting: Internal Medicine

## 2010-06-24 ENCOUNTER — Encounter: Payer: Self-pay | Admitting: Internal Medicine

## 2010-06-24 DIAGNOSIS — T148XXA Other injury of unspecified body region, initial encounter: Secondary | ICD-10-CM

## 2010-06-24 DIAGNOSIS — T82111A Breakdown (mechanical) of cardiac pulse generator (battery), initial encounter: Secondary | ICD-10-CM | POA: Insufficient documentation

## 2010-06-24 DIAGNOSIS — T82190A Other mechanical complication of cardiac electrode, initial encounter: Secondary | ICD-10-CM

## 2010-06-24 NOTE — Patient Instructions (Signed)
Your physician recommends that you schedule a follow-up appointment in: MON  WITH DR  Graciela Husbands Your physician recommends that you continue on your current medications as directed. Please refer to the Current Medication list given to you today.

## 2010-06-24 NOTE — Assessment & Plan Note (Signed)
The patient's hemoglobin from Monday as well as the discoloration suggests that the previous bleed has resolved. We have stopped her Coumadin. She has a history of only a remote stroke. We will plan to continue to withhold Coumadin and apply an Elastoplast pressure dressing. We will revisit the pocket on Monday. She and I are both disinclined to proceed with pocket evacuation at this time

## 2010-06-24 NOTE — Progress Notes (Signed)
HPI  [name] is a 77 y.o. female History of CRT-D to CRT P.Generator change on March 30. I saw her Monday because of a pocket hematoma. The pocket was relatively soft there is a broad discoloration that was mostly yellow and greenish suggesting old blood. She continues however to have significant pain. The fullness of the pocket has changed in character over the last 24 hours becoming firm again  We have drawn a CBC demonstrating a hemoglobin of about 12.1 down from 12.8; i.e. This is relatively stable   Past Medical History  Diagnosis Date  . Benign neoplasm of colon   . Diverticulosis of colon (without mention of hemorrhage)   . Reflux esophagitis   . Gastric polyp     history of  . GERD (gastroesophageal reflux disease)   . Stricture and stenosis of esophagus   . Reactive airway disease   . Anemia, iron deficiency   . CHF (congestive heart failure)   . Restless leg syndrome   . Hypercholesterolemia   . Nonischemic cardiomyopathy     EF 20%  . Personal history of sudden cardiac death successfully resuscitated     status post cardioverter- defibrillator  . Atrial fibrillation   . Diabetes mellitus   . Osteoarthritis     Past Surgical History  Procedure Date  . Cardiac defibrillator placement 1999, 2001, 3.30.2012    [facility], Removal of previously implanted BiV ICD followed by insertion of BiV pacemaker with pacemaker pocket revision. without immediate procedural complications.   . Eye surgery 2002    cataract   . Total hip arthroplasty 2004    right    Current Outpatient Prescriptions  Medication Sig Dispense Refill  . acetaminophen (TYLENOL) 325 MG tablet Take 650 mg by mouth every 6 (six) hours as needed.        Marland Kitchen aspirin 81 MG tablet Take 81 mg by mouth daily.        Marland Kitchen azelastine (OPTIVAR) 0.05 % ophthalmic solution 1 drop 2 (two) times daily.        Marland Kitchen desloratadine (CLARINEX) 5 MG tablet Take 5 mg by mouth daily.        . fish oil-omega-3 fatty  acids 1000 MG capsule Take 2 g by mouth daily.        . furosemide (LASIX) 40 MG tablet Take 40 mg by mouth 2 (two) times daily.        . irbesartan (AVAPRO) 75 MG tablet Take 75 mg by mouth at bedtime.        Marland Kitchen levothyroxine (SYNTHROID, LEVOTHROID) 100 MCG tablet Take 100 mcg by mouth daily.        . metoprolol (TOPROL-XL) 50 MG 24 hr tablet Take 50 mg by mouth daily.        . montelukast (SINGULAIR) 10 MG tablet Take 10 mg by mouth at bedtime.        . Multiple Vitamin (MULTIVITAMIN) tablet Take 1 tablet by mouth daily.        . Multiple Vitamins-Minerals (ICAPS PO) Take 1 capsule by mouth.        . Pancrelipase, Lip-Prot-Amyl, (CREON) 12000 UNITS CPEP Take 3 capsules by mouth daily.        Bertram Gala Glycol-Propyl Glycol (SYSTANE ULTRA OP) Apply 3 drops to eye 3 (three) times daily.        . sotalol (BETAPACE) 80 MG tablet Take 80 mg by mouth 2 (two) times daily.        Marland Kitchen  spironolactone (ALDACTONE) 25 MG tablet Take 25 mg by mouth daily.        Marland Kitchen warfarin (COUMADIN) 4 MG tablet Take 4 mg by mouth daily.          Allergies  Allergen Reactions  . Iodinated Diagnostic Agents     Review of Systems negative except from HPI and PMH  Physical Exam Well developed and well nourished in no acute distress HENT normal E scleral and icterus clear Neck Supple Moderate sized hematoma; it is firm to touch. It is tender. It is neither red nor warm. Regular rate and rhythm Soft with active bowel sounds No clubbing cyanosis and edema Alert and oriented, grossly normal motor and sensory function     Assessment and  Plan

## 2010-06-27 ENCOUNTER — Ambulatory Visit (INDEPENDENT_AMBULATORY_CARE_PROVIDER_SITE_OTHER): Payer: Medicare Other | Admitting: Internal Medicine

## 2010-06-27 ENCOUNTER — Encounter: Payer: Self-pay | Admitting: Internal Medicine

## 2010-06-27 VITALS — BP 132/80 | HR 70 | Ht 62.0 in | Wt 175.1 lb

## 2010-06-27 DIAGNOSIS — T82190A Other mechanical complication of cardiac electrode, initial encounter: Secondary | ICD-10-CM

## 2010-06-27 DIAGNOSIS — T82111A Breakdown (mechanical) of cardiac pulse generator (battery), initial encounter: Secondary | ICD-10-CM

## 2010-06-27 NOTE — Progress Notes (Signed)
[name] is seen in followup because of pacemaker hematoma.  This followed a CRT-D-CIDP device revision by Dr. Leonia Reeves.  Last week early in the week her wound looked like it was resolving. However, on Friday, there was significant fullness and taut tightness in the pocket itself. We applied Elastoplast dressing and she comes in today for review. It is much softer. There is no evidence of acute blood discoloration. SHe will see Dr. Ladona Ridgel next week.

## 2010-06-27 NOTE — Patient Instructions (Signed)
Your physician recommends that you schedule a follow-up appointment in: MON AFTERNOON WITH DR [facility]. Your physician recommends that you continue on your current medications as directed. Please refer to the Current Medication list given to you today.

## 2010-06-27 NOTE — Assessment & Plan Note (Signed)
Pocket hematoma is softer today.

## 2010-07-04 ENCOUNTER — Encounter (INDEPENDENT_AMBULATORY_CARE_PROVIDER_SITE_OTHER): Payer: Medicare Other | Admitting: Internal Medicine

## 2010-07-04 DIAGNOSIS — R0989 Other specified symptoms and signs involving the circulatory and respiratory systems: Secondary | ICD-10-CM

## 2010-07-14 ENCOUNTER — Encounter: Payer: Self-pay | Admitting: Internal Medicine

## 2010-07-14 ENCOUNTER — Ambulatory Visit (INDEPENDENT_AMBULATORY_CARE_PROVIDER_SITE_OTHER): Payer: Medicare Other | Admitting: Internal Medicine

## 2010-07-14 VITALS — BP 122/82 | HR 52 | Ht 62.0 in | Wt 176.0 lb

## 2010-07-14 DIAGNOSIS — Z95810 Presence of automatic (implantable) cardiac defibrillator: Secondary | ICD-10-CM

## 2010-07-14 NOTE — Assessment & Plan Note (Signed)
Her hematoma is improved and her incision appears to be healing. I have asked her to hold her coumadin for 3 more weeks as I would like to be sure she is not going to have an enlarging hematoma before starting coumadin back.

## 2010-07-14 NOTE — Patient Instructions (Signed)
Your physician recommends that you schedule a follow-up appointment in: 2-3 weeks in device clinic on a day Dr Ladona Ridgel is in the office  No Coumadin for now

## 2010-07-14 NOTE — Progress Notes (Signed)
HPI  [name] returns today for follow up.She is a pleasant elderly woman with a history of symptomatic bradycardia status post pacemaker insertion. Postop she developed a large hematoma. She returns today for followup. Her hematoma has improved. No c/p or sob. Allergies  Allergen Reactions  . Iodinated Diagnostic Agents      Current Outpatient Prescriptions  Medication Sig Dispense Refill  . acetaminophen (TYLENOL) 325 MG tablet Take 650 mg by mouth every 6 (six) hours as needed.        Marland Kitchen aspirin 81 MG tablet Take 81 mg by mouth daily.        Marland Kitchen azelastine (OPTIVAR) 0.05 % ophthalmic solution 1 drop 2 (two) times daily.        Marland Kitchen desloratadine (CLARINEX) 5 MG tablet Take 5 mg by mouth daily.        . fish oil-omega-3 fatty acids 1000 MG capsule Take 2 g by mouth daily.        . furosemide (LASIX) 40 MG tablet Take 40 mg by mouth 2 (two) times daily.       . irbesartan (AVAPRO) 75 MG tablet Take 75 mg by mouth at bedtime.        Marland Kitchen levothyroxine (SYNTHROID, LEVOTHROID) 100 MCG tablet Take 100 mcg by mouth daily.        . metoprolol (TOPROL-XL) 50 MG 24 hr tablet Take 50 mg by mouth daily.        . montelukast (SINGULAIR) 10 MG tablet Take 10 mg by mouth at bedtime.        . Multiple Vitamin (MULTIVITAMIN) tablet Take 1 tablet by mouth daily.        . Multiple Vitamins-Minerals (ICAPS PO) Take 1 capsule by mouth.        . Pancrelipase, Lip-Prot-Amyl, (CREON) 12000 UNITS CPEP Take 3 capsules by mouth daily.        Bertram Gala Glycol-Propyl Glycol (SYSTANE ULTRA OP) Apply 3 drops to eye 3 (three) times daily.        . sotalol (BETAPACE) 80 MG tablet Take 80 mg by mouth 2 (two) times daily.        Marland Kitchen spironolactone (ALDACTONE) 25 MG tablet Take 25 mg by mouth daily.        Marland Kitchen warfarin (COUMADIN) 4 MG tablet Take 4 mg by mouth daily.           Past Medical History  Diagnosis Date  . Benign neoplasm of colon   . Diverticulosis of colon (without mention of hemorrhage)   . Reflux esophagitis    . Gastric polyp     history of  . GERD (gastroesophageal reflux disease)   . Stricture and stenosis of esophagus   . Reactive airway disease   . Anemia, iron deficiency   . CHF (congestive heart failure)   . Restless leg syndrome   . Hypercholesterolemia   . Nonischemic cardiomyopathy     EF 20%  . Personal history of sudden cardiac death successfully resuscitated     status post cardioverter- defibrillator  . Atrial fibrillation   . Diabetes mellitus   . Osteoarthritis     ROS:   All systems reviewed and negative except as noted in the HPI.   Past Surgical History  Procedure Date  . Cardiac defibrillator placement 1999, 2001, 3.30.2012    [facility], Removal of previously implanted BiV ICD followed by insertion of BiV pacemaker with pacemaker pocket revision. without immediate procedural complications.   . Eye surgery 2002  cataract   . Total hip arthroplasty 2004    right     No family history on file.   History   Social History  . Marital Status: Widowed    Spouse Name: N/A    Number of Children: N/A  . Years of Education: N/A   Occupational History  . Not on file.   Social History Main Topics  . Smoking status: Never Smoker   . Smokeless tobacco: Not on file  . Alcohol Use: No  . Drug Use: No  . Sexually Active:    Other Topics Concern  . Not on file   Social History Narrative  . No narrative on file     BP 122/82  Pulse 52  Ht 5\' 2"  (1.575 m)  Wt 176 lb (79.833 kg)  BMI 32.19 kg/m2  Physical Exam:  Well appearing NAD HEENT: Unremarkable Neck:  No JVD, no thyromegally Lymphatics:  No adenopathy Back:  No CVA tenderness Lungs:  Clear. Hematoma is present but improved. HEART:  Regular rate rhythm, no murmurs, no rubs, no clicks Abd:  Flat, positive bowel sounds, no organomegally, no rebound, no guarding Ext:  2 plus pulses, no edema, no cyanosis, no clubbing Skin:  No rashes no nodules Neuro:  CN II through XII intact,  motor grossly intact  Assess/Plan:

## 2010-07-18 ENCOUNTER — Encounter: Payer: Medicare Other | Admitting: *Deleted

## 2010-07-26 NOTE — Assessment & Plan Note (Signed)
[facility]                         GASTROENTEROLOGY OFFICE NOTE   AMENAH, TUCCI                        MRN:          [id number]  DATE:01/24/2007                            DOB:          April 20, 1926    REASON FOR CONSULTATION:  Dysphagia.   HISTORY:  This is a 77 year old white female with multiple medical  problems, including hypertension, dyslipidemia, reactive airway disease,  history of malignant cardiac arrhythmia status post automatic  implantable cardio defibrillator placement with subsequent replacement,  chronic Coumadin therapy, and a history of congestive heart failure.  She was evaluated in the office September 07, 2006 for iron deficiency  anemia.  This was felt secondary to known erosive gastric polyps and/or  nonspecific mucosal oozing.  Prior GI workup occurred in February 2006.  At that time, she was noted to have distal esophageal stricture.  Because of dysphagia, Elease Hashimoto dilation was attempted.  However, passage  of the dilator was difficult due to pharyngeal resistance.  She did  derive benefit from that procedure.  She was off Coumadin for her  examinations.  She was last seen in this office December 11, 2006.  She  has had improving hemoglobins on iron replacement therapy.  Her last  hemoglobin January 21, 2007 was normal at 12.9.  She presents today  because of recurrent dysphagia to solid foods.  She has had several  significant episodes over the past few months.  No other issues or  complaints.   CURRENT MEDICATIONS:  Avapro, spironolactone, Lasix, Singulair,  Clarinex, Prilosec, mexiletine, Coumadin, Tylenol, Toprol XL, Synthroid,  sotalol, new iron, omega 3 fish oil, aspirin.   PHYSICAL EXAMINATION:  Well-appearing female in no acute distress.  Her blood pressure is 140/62.  Heart rate is 80.  Weight is 187 pounds.  HEENT:  Sclerae anicteric.  Conjunctivae are pink.  Oral mucosa is  intact.  No adenopathy.  LUNGS:   Clear.  HEART:  Regular.  ABDOMEN:  Soft without tenderness, mass, or hernia.  Good bowel sounds  heard.   IMPRESSION:  1. Recurrent dysphagia due to known peptic stricture.  2. Gastroesophageal reflux disease.  3. Iron deficiency anemia presumably due to chronic gastrointestinal      blood loss from erosive gastric polyps and exacerbated by Coumadin      and aspirin.  4. Anemia resolved on iron replacement therapy.  5. Multiple significant medical problems.   RECOMMENDATIONS:  1. Continue proton pump inhibitor therapy.  2. Continue iron replacement indefinitely.  3. Continue periodic monitoring of hemoglobin per Dr. Eloise Harman.  4. Schedule upper endoscopy with esophageal dilation.  The nature of      the procedure, as well as the risks, benefits, and alternatives      have been reviewed.  She understood, and agreed to proceed.  We      would like to hold her Coumadin 3 days prior to the examination to      minimize the risk of procedure-related bleeding.  I have discussed      this with the patient and her husband.  We will confirm with her  cardiologist, from [place], if this is acceptable.     Wilhemina Bonito. Marina Goodell, MD  Electronically Signed    JNP/MedQ  DD: 01/24/2007  DT: 01/25/2007  Job #: [id number]   cc:   Barry Dienes. Eloise Harman, M.D.

## 2010-07-26 NOTE — Letter (Signed)
January 20, 2008    Barry Dienes. Eloise Harman, M.D.  [address]  [place], [place] [address]   RE:  [name], [name]  MRN:  [id number]  /  DOB:  [birth year]   Dear Jesusita Oka,   Thank you for referring [name] over for ongoing Cardiology and  EP followup.  As you know, she is a very pleasant elderly woman with a  host of cardiac problems including nonischemic cardiomyopathy,  paroxysmal AFib and history and VT.  She is status post BiV ICD  insertion.  The patient had her cardiology care at the [facility] under the direction of Dr. Ambrose Mantle, but because  of convenience sake has requested to be transferred to have her cardiac  here in [place].  She is here today for additional followup.  Today,  we reviewed the patient's medical problems in detail and also I was  sadden here about her husband who had recently passed away after falling  while on Coumadin.  All-in-all, the patient is stable and her  defibrillator is working normally.  We will send to your office from our  visit today.  Thanks again for referring [name] for EP and  cardiology evaluation and followup.    Sincerely,      Doylene Canning. Ladona Ridgel, MD  Electronically Signed    GWT/MedQ  DD: 01/20/2008  DT: 01/21/2008  Job #: [id number]

## 2010-07-26 NOTE — Assessment & Plan Note (Signed)
[facility]                         GASTROENTEROLOGY OFFICE NOTE   MAGDELENA, KINSELLA                        MRN:          [id number]  DATE:12/11/2006                            DOB:          [birth year]    HISTORY:  [name] presents today for followup.  She is a 77 year old  with multiple medical problems who was evaluated September 07, 2006 regarding  iron-deficiency anemia.  See that dictation for details.  At that time,  she was felt to have iron-deficiency anemia due to chronic  gastrointestinal blood loss from either erosive gastric polyps, as  previously demonstrated on endoscopy, exacerbated by Coumadin and  aspirin and/or nonspecific chronic mucosal oozing from a combination of  aspirin and Coumadin.  The recommendation was to increase Nu-Iron 150 to  3 times daily and monitor hemoglobins every 4 weeks.  It seems that she  has been taking Nu-Iron 150 once daily.  In any event, follow-up  hemoglobin in August was 11.2.  This was improved from 9.6.  Hemoglobin  2 weeks ago was 12.3.  Repeat iron studies were now normal.  She  continues to complain of nonspecific fatigue which she has had for  years.  No other specific complaints.  Her GI review of systems is  negative.  There has been no evidence of clinical bleeding.   MEDICATIONS:  Avapro, Spironolactone, Lasix, Singulair, Clarinex,  Prilosec, mexiletine, Coumadin, Tylenol, Toprol-XL, Synthroid, sotalol,  Nu-Iron, omega-3 fish oil, aspirin, and Asmanex.   PHYSICAL EXAMINATION FINDINGS:  GENERAL:  Pleasant elderly female in no  acute distress.  She is alert and oriented.  VITAL SIGNS:  Blood pressure is 112/62, heart rate is 80 and regular,  weight is 186 pounds (increased 3.4 pounds).  HEENT:  Sclerae are anicteric.  Conjunctivae are pink.  Oral mucosa  intact.  LUNGS:  Clear.  HEART:  Regular.  ABDOMEN:  Soft, without tenderness, mass, or hernia.   IMPRESSION:  Recent problems with  iron-deficiency anemia, now resolved  on iron replacement therapy.  Probable etiologies as discussed.   RECOMMENDATIONS:  1. Continue iron supplementation once daily.  2. Continue to monitor hemoglobin levels at regular intervals.  If her      hemoglobin were to drop on once daily iron, then increase iron to 3      times daily, and monitor hemoglobin thereafter.  3. Resume general medical care with Dr. Eloise Harman.  GI followup p.r.n.    Wilhemina Bonito. Marina Goodell, MD  Electronically Signed   JNP/MedQ  DD: 12/11/2006  DT: 12/12/2006  Job #: [id number]   cc:   Barry Dienes. Eloise Harman, M.D.

## 2010-07-26 NOTE — Assessment & Plan Note (Signed)
[facility]                         GASTROENTEROLOGY OFFICE NOTE   [name], [name]                        MRN:          [id number]  DATE:09/07/2006                            DOB:          1927/01/19    REASON FOR CONSULTATION:  Iron-deficiency anemia.   HISTORY:  This is a pleasant 77 year old white female with multiple  medical problems, including hypertension, dyslipidemia, reactive airway  disease, history of malignant cardiac arrhythmia, status post automatic  implantable cardioverter defibrillator placement/replacement, chronic  Coumadin therapy, and a history of congestive heart failure.  Patient is  referred today regarding iron-deficiency anemia.  She was seen in this  office in February, 2006 for Hemoccult positive stool.  At that time,  her hemoglobin was normal at 14.  She did, however, undergo both  colonoscopy and upper endoscopy.  Colonoscopy revealed the presence of  multiple colon polyps, measuring seven in number with size varying  between 3 and 15 mm.  The polyps were found to be adenomatous.  Upper  endoscopy revealed peptic stricture of the esophagus as well as reflux  esophagitis.  In addition, multiple benign gastric polyps measuring  between 3 and 1 cm.  Many of the polyps were inflamed with surface  erosions.  Biopsies were taken and revealed chronic active gastritis  with Helicobacter pylori.  She was treated with a Prev pack x10 days.  Since that time, she has been clinically stable.  Her only complaint is  that of progressive fatigue.   Laboratories with Dr. Eloise Harman on June 12, 2006, revealed a hemoglobin  of 9.6.  Her MCV was low at 80.  Iron studies confirmed iron deficiency  with a ferritin level of 2.4 and an iron saturation of 12%.  For this,  she was started on NuIron 150 mg once daily.  This was begun  approximately one week ago.  She has tolerated this well.   Patient's current GI review of systems is  unremarkable.  In particular,  she denies heartburn, dysphagia, abdominal pain, change in bowel habits,  melena, hematochezia, or weight loss.  Actually, she has had some weight  gain.  Patient has had a persistent craving for ice.   PAST MEDICAL HISTORY:  As above.   PAST SURGICAL HISTORY:  1. Right hip replacement.  2. Cataract surgery.  3. Defibrillator placement x2.   ALLERGIES:  No known drug allergies.   CURRENT MEDICATIONS:  1. Avapro 75 mg daily.  2. Spironolactone 25 mg daily.  3. Lasix 20 mg daily.  4. Singulair 10 mg daily.  5. Clarinex 10 mg daily.  6. Prilosec 20 mg b.i.d.  7. Mexiletine 150 mg b.i.d.  8. Coumadin 3 mg daily.  9. Tylenol 500 mg q.i.d.  10.Toprol XL 50 mg daily.  11.Synthroid 50 mcg b.i.d.  12.Sotolol 80 mg daily.  13.NuIron 150 mg daily.  14.Omega 3 fish oil.  15.Baby aspirin 81 mg daily.   FAMILY HISTORY:  Unknown.  Patient is adopted.   SOCIAL HISTORY:  Patient is married with three children.  She is  accompanied by her husband.  She is  a retired Runner, broadcasting/film/video and Pharmacist, hospital.  She does not smoke.  She occasionally uses alcohol.   REVIEW OF SYSTEMS:  Unremarkable.   PHYSICAL EXAMINATION:  GENERAL:  An elderly female in no acute distress.  VITAL SIGNS:  Blood pressure is 108/62, heart rate 78.  Weight is 182.6  pounds (increased 11.6 pounds).  HEENT:  Sclerae are anicteric.  Conjunctivae are pale.  Oral mucosa  intact.  NECK:  There is no adenopathy.  LUNGS:  Clear.  HEART:  Regular.  ABDOMEN:  Soft without tenderness, mass, or hernia.  Good bowel sounds  heard.  EXTREMITIES:  Without edema.   IMPRESSION:  This is a 77 year old female with a prior history of  hemoccult positive stool and colonoscopy and upper endoscopy results as  discussed above.  She now presents with iron-deficiency anemia while on  aspirin and Coumadin.  The patient's iron-deficiency anemia is almost  certainly due to chronic gastrointestinal blood loss from  either erosive  gastric polyps, as noted on her previous endoscopy, in the face of  Coumadin and aspirin therapy and/or nonspecific chronic mucosal oozing  from a combination of aspirin and Coumadin therapy.  She is on a b.i.d.  proton pump inhibitor.  I discussed with her multiple options, including  repeat endoscopic evaluations or conservative medical therapy.  At this  point, feeling fairly confident as to the cause of her anemia, we have  elected to proceed with conservative medical therapy at this time.   PLAN:  1. Increase NuIron to 150 mg p.o. t.i.d.  2. Monitor hemoglobin every four weeks with planned office followup in      three months.  If the patient's hemoglobin does not respond or      anemia progresses, then repeat endoscopic evaluations to confirm no      other cause for blood loss should be made.  If such is the case,      then either she should be considered for discontinuation of      anticoagulation therapy (decision to be left to her prescribing      physician or physicians) or receive blood transfusions as needed to      ameliorate clinical symptoms.  She prefers to have her blood counts      drawn at Dr. Silvano Rusk office at the time of her prothrombin time      checks, and she will have those forwarded accordingly for my      review.     Wilhemina Bonito. Marina Goodell, MD  Electronically Signed    JNP/MedQ  DD: 09/07/2006  DT: 09/08/2006  Job #: [id number]   cc:   Barry Dienes. Eloise Harman, M.D.

## 2010-07-26 NOTE — Assessment & Plan Note (Signed)
[facility]                         ELECTROPHYSIOLOGY OFFICE NOTE   [name], [name]                        MRN:          [id number]  DATE:01/20/2008                            DOB:          Jan 16, 1927    HISTORY OF PRESENT ILLNESS:  [name] is referred today by Dr.  Ambrose Mantle from [facility].  The patient resides in [place]  and has gotten essentially all of her cardiac care at [facility],  but because of inconvenience for her family who has to transport her  over and back, she is asked to have her additional cardiac evaluation  and treatment here with our practice.  The patient very remotely was  seen by another cardiologist in town, but then transferred her care to  the [facility] and has been followed by Dr.  Ambrose Mantle for several years.  She is followed by Dr. Dossie Arbour  in our primary care division here with [facility].  The  patient has a history of congestive heart failure.  She has a history of  ventricular tachycardia.  She has a history of recurrent atrial  arrhythmias as well.  She has been stable over the last year.  She has  not been hospitalized.  She underwent BiV ICD insertion back in 2006.  Question whether this is related to the MADIT-CRT trial or not.  She is  here today for additional followup.  She has had no intercurrent ICD  therapies.  She denies any recent fevers or chills and has been  otherwise stable.   MEDICATIONS:  Her medicines include  1. Toprol-XL 12.5 a day.  2. Lasix 20 b.i.d.  3. Sotalol 80 twice a day.  4. Clarinex 10 a day.  5. Prilosec 20 a day.  6. Coumadin as directed.  7. Mexiletine 150 twice a day.  8. Levothyroxine 75 mcg daily.  9. Aspirin 81 a day.  10.Iron sulfate daily.  11.Fish oil.  12.Multivitamin.  13.She is also on Avapro.   PAST MEDICAL HISTORY:  Her additional past medical history is as noted  in the HPI.  She also  has osteoarthritis and type 2 diabetes.   FAMILY HISTORY:  Noncontributory at her advanced age.   SOCIAL HISTORY:  The patient is widowed.  She denies tobacco or ethanol  abuse.  She lives in [place].  She has a daughter who lives here as  well.   REVIEW OF SYSTEMS:  Her review of systems is notable for some arthritic  complaints, rare palpitations, some difficulty swallowing status post  recent esophageal dilatation.  Her current heart failure symptoms are  fairly well controlled and is essentially quiescent.  Otherwise, rest of  her review of systems was negative.   PHYSICAL EXAMINATION:  GENERAL:  She is a pleasant, well appearing  elderly woman, in no distress.  VITAL SIGNS:  Blood pressure was 102/66, the pulse 56 and regular,  respirations were 18, and the weight was 172 pounds.  HEENT:  Normocephalic and atraumatic.  Pupils are equal and round.  Oropharynx is moist.  Sclerae anicteric.  NECK:  Revealed no jugular venous distention.  There are no thyromegaly.  Trachea is midline.  The carotids are 2+ and symmetric.  LUNGS:  Clear  bilaterally to auscultation.  No wheezes, rales, or rhonchi are present.  There are no increased work of breathing.  CARDIOVASCULAR:  Revealed a regular rate and rhythm.  Normal S1 and S2.  There is a soft systolic murmur at the left lower sternal border great  1/6.  PMI was not enlarged, but was laterally displaced.  ABDOMEN:  Soft and nontender.  There is no organomegaly.  EXTREMITIES:  Demonstrated no cyanosis, clubbing, or edema.  Pulses are  2+ and symmetric.  NEUROLOGIC:  Alert and oriented x3.  Cranial nerves intact.  Strength is  5/5 and symmetric.   Interrogation of her defibrillator demonstrates a Guidant H-215, the P-  waves are 2, the R-waves 25.  The impedance 405 in the A, 591 in the RV,  659 in the LV, threshold 0.6 at 0.4 in the atrium, 0.6 at 0.4 in the  right ventricle and 1.4 at 0.5 in the left ventricle.  Battery voltage   was 2.65 volts.  She was 96% A paced, 94% V paced.   IMPRESSION:  1. Nonischemic cardiomyopathy, ejection fraction 20%.  2. Congestive heart failure.  3. History of sudden cardiac death status post implantable      cardioverter-defibrillator.  4. Paroxysmal atrial fibrillation.  5. Status post biventricular implantable cardioverter-defibrillator.  6. Diabetes.  7. Osteoarthritis.   DISCUSSION:  [name] is stable today.  Her heart failure is well  controlled as are her atrial arrhythmias.  She is managing with a loss  of her husband, 1 month ago.  She will see [place] back for a followup in  approximately 4 months.  I have encouraged her to stay active and walk.  I have encouraged here to maintain a low-salt diet and take her  medicines as directed.     Doylene Canning. Ladona Ridgel, MD  Electronically Signed    GWT/MedQ  DD: 01/20/2008  DT: 01/21/2008  Job #: [id number]   cc:   Barry Dienes. Eloise Harman, M.D.  Wells L. Vinnie Langton, MD

## 2010-07-28 ENCOUNTER — Other Ambulatory Visit: Payer: Self-pay | Admitting: Internal Medicine

## 2010-07-28 ENCOUNTER — Encounter: Payer: Medicare Other | Admitting: *Deleted

## 2010-07-29 NOTE — H&P (Signed)
[facility]  Patient:    [name], [name]                          MRN: [id number] Adm. Date:  11/12/99 Attending:  Janine Limbo, M.D. CC:         Dr. Ivery Quale                         History and Physical  HISTORY OF PRESENT ILLNESS:   The patient is a 77 year old female, para 3-0-1-3, who is admitted for hysteroscopy and D&C because of an endometrial polyp and because of fluid within the uterus.  The patient had an ultrasound performed on November 02, 1999, that confirmed the above findings.  PAST MEDICAL HISTORY:         The patient had a cardiac arrest in January of 1999.  She is known to have an arrhythmia and she currently wears a defibrillator.  The patient has a history of hypertension and there is a questionable history of a cerebrovascular accident in 50.  CURRENT MEDICATIONS:          Include Coumadin which she last took on Wednesday night.  She also takes digoxin, Avapro, Lasix, Claritin, Klor-Con, and Prilosec.  DRUG ALLERGIES:               None known.  SOCIAL HISTORY:               The patient is married and she is a retired Runner, broadcasting/film/video.  She denies cigarette use and recreational drug use.  She does drink alcohol socially.  REVIEW OF SYSTEMS:            The patient does have headache.  She complains of fatigue.  She wears glasses.  FAMILY HISTORY:               Noncontributory.  PHYSICAL EXAMINATION:  VITAL SIGNS:                  Weight is 181 pounds.  Height is 5 feet and 3 inches.  HEENT:                        Within normal limits.  NECK:                         Thyroid is not enlarged.  CHEST:                        Clear.  HEART:                        Regular rate and rhythm.  BREASTS:                      No masses.  BACK:                         Normal.  ABDOMEN:                      Nontender and no masses are appreciated.  There is no adenopathy present.  EXTREMITIES:                  Within normal  limits.  NEUROLOGIC EXAMINATION:  Grossly normal.  PELVIC EXAMINATION:           External genitalia is normal.  The vagina is atrophic.  The cervix is nontender and no lesions are present.  The uterus is normal size, shape, and consistency.  Adnexa with no masses and rectovaginal exam confirms.  ASSESSMENT:                   Endometrial polyp and fluid within the uterus.  PLAN:                         The patient will undergo a hysteroscopy with dilatation and curettage.  She had been cleared for surgery by her cardiologist at [facility].  The patient understands the indications for her procedure and she accepts the risks of, but not limited to, anesthetic complications, bleeding, infection, and possible damage to the surrounding organs.  She will restart on her Coumadin after her surgery. DD:  11/11/99 TD:  11/11/99 Job: [id number]

## 2010-07-29 NOTE — Op Note (Signed)
[facility]  Patient:    Kristen, Hoffman                        MRN: [id number] Proc. Date: 11/12/99 Adm. Date:  [id number] Disc. Date: [id number] Attending:  Leonard Schwartz                           Operative Report  PREOPERATIVE DIAGNOSES:       1. Hydrometra.                               2. Uterine polyps.  POSTOPERATIVE DIAGNOSES:      1. Hydrometra.                               2. Uterine polyps.                               3. Uterine fibroids.  PROCEDURE:                    1. Diagnostic hysteroscopy.                               2. Hysteroscopic uterine biopsies.                               3. Dilatation and curettage.  SURGEON:                      Janine Limbo, M.D.  ANESTHETIC:                   IV sedation and paracervical block.  DISPOSITION:                  The patient is a 77 year old who presents with the above-mentioned diagnoses.  She understands the indications for her procedure, and she accepts the risks of, but not limited to, anesthetic complications, bleeding, infection, and possible damage to the surrounding organs.  FINDINGS:                     The uterine sounded to 7 cm.  There was a 1 cm polyp present in the right fundus of the uterus.  There was a 0.5 cm polyp present on the left mid uterus.  There was a submucosal fibroid present on the posterior mid uterus, that measured less than 1 cm.  The endometrial lining appeared normal, and there were no signs of malignancies.  DESCRIPTION OF PROCEDURE:     The patient was taken to the operating room, where she was given medication through her IV line.  The perineum and vagina were prepped with multiple layers of Betadine, and then sterilely draped.  A Foley catheter was placed in the bladder.  Examination under anesthesia was performed.  The cervix was then injected with 10 cc of Xylocaine within the cervical stroma.  A paracervical block was placed  using 10 cc of Xylocaine 1%. An endocervical curettage was performed.  The uterus was sounded to 7 cm.  The cervix was gradually dilated.  The hysteroscope was inserted and the  cavity was evaluated.  Findings are mentioned above.  Biopsies were obtained from the polypoid-appearing lesions.  The cavity was then curetted.  Hemostasis was adequate.  All instruments were removed.  The Sorbitol difference was 50 cc or less because a significant amount of Sorbitol was present on the operating room floor.  The patient was awakened from her anesthetic and taken to the recovery room in stable condition.  ESTIMATED BLOOD LOSS:         10 cc.  Sponge, needle and instrument counts were correct.  The patient tolerated the procedure well.  FOLLOW-UP INSTRUCTIONS:  The patient will return to see Dr. Stefano Gaul in two to four weeks.  She was given Vicodin one tablet q.4h. p.r.n. pain.  We will observe the patient in the recovery room for longer than normal because of her medical problems.  We will check her electrolytes in the recovery room. DD:  11/12/99 TD:  11/14/99 Job: [id number]

## 2010-07-29 NOTE — Discharge Summary (Signed)
NAME:  [name], [name]                           ACCOUNT NO.:  [id number]   MEDICAL RECORD NO.:  [id number]                   PATIENT TYPE:  INP   LOCATION:  3728                                 FACILITY:  [facility]   PHYSICIAN:  Darlin Priestly, M.D.             DATE OF BIRTH:  1926-11-23   DATE OF ADMISSION:  01/10/2003  DATE OF DISCHARGE:  01/16/2003                                 DISCHARGE SUMMARY   DISCHARGE DIAGNOSES:  1. Severe osteoarthritis status post right total hip arthroplasty on     January 13, 2003, by Dr. Renae Fickle.  2. Severe cardiomyopathy with ejection fraction 20%, status post     biventricular pacer implantation at [facility].  3. History of cardiac arrest and status post automatic implantable     cardioverter/defibrillator (AICD) implantation.  4. Anticoagulation therapy with Coumadin chronically.  5. Paroxysmal atrial fibrillation.   [name] is a 77 year old Caucasian lady who was admitted to the hospital  prior to her right total hip arthroplasty for Coumadin-heparin crossover.  Patient was seen prior to her surgery in our office on January 02, 2003, and  at that time was switched from Toprol to Coreg, and the plan was to  gradually increase the dose from the initial 3.125 mg b.i.d. to the maximum  dose of 25 mg a day due over the period of the next six months to a year.   She was given physical clearance for her upcoming surgery and because of her  Coumadin anticoagulation therapy we admitted her to the hospital a couple of  days prior to the surgery for Coumadin-heparin crossover.   Surgery was performed by Dr. Renae Fickle on January 13, 2003.  Prior to that  procedure, the patient's pacer was interrogated and ICD was turned off, and  post-surgery ICD was turned on.  Sensing amplitude of the atrium was 3.24  and biventricular sensing amplitude was 9.68, impedance atrial 365 and  impedance biventricular 316.  Threshold atrial 0.6  ohms and 0.5 msec, left  ventricular threshold was 0.5 msec and right ventricular threshold also 0.5  msec.   Patient tolerated the procedure well and was started her load with Coumadin  gradually postop but she had problems with dizziness and extreme sleepiness,  and problems with tolerating her pain medications.   Her INR on the day of discharge was 1.3, hemoglobin 9.1, hematocrit 26.3.  Sodium 132, potassium 4.2, BUN 21, creatinine 0.9, glucose 117.   Vital signs were stable and she was __________ urinary output.   Orthopedic team saw patient on the day of her discharge and she was found to  be stable for transfer to the skilled nursing facility for rehabilitation  post right total hip arthroplasty.  Her PCA was discontinued and she was  started on Percocet p.o. as needed every four to six hours for severe pain.  She was allowed to have Dilaudid IV and also for cramping and  musculoskeletal discomfort Skelaxin.   Patient is going to be transferred to [facility]  for rehabilitation postop.   DISCHARGE MEDICATIONS:  1. Avapro 75 mg every day.  2. Aldactone 25 mg every day.  3. Lasix 20 mg every day.  4. Singulair 10 mg every day.  5. Claritin 10 mg every day.  6. Protonix 40 mg every day.  7. Lanoxin 0.125 mg every day.  8. Coreg 6.25 mg every day.  9. Coumadin 3 mg every day.  10.      Ambien 5 mg q.h.s. p.r.n.  11.      Percocet 325/5 mg one to two pills q.4-6 hours p.r.n. pain.  12.      Skelaxin 800 mg p.o. q.6-8 hours for musculoskeletal pain and     cramping.  13.      Dilaudid 1 to 3 mg IV q.3-4 hours for severe pain.   DISCHARGE ACTIVITIES:  Gradual progression of the physical activity as  recommended by Physical Therapy team at nursing home.   DISCHARGE DIET:  Low-salt, low-fat diet.   WOUND CARE:  Per Ortho.   SPECIAL INSTRUCTIONS:  Patient need her weekly ProTime and INR results faxed  to our office, [facility],  attention Dr. Jenne Campus, and  the fax number is [phone].   DISCHARGE FOLLOW UP:  Dr. Renae Fickle will see patient on January 28, 2003.      Raymon Mutton, P.A.                    Darlin Priestly, M.D.    MK/MEDQ  D:  01/16/2003  T:  01/16/2003  Job:  [id number]

## 2010-07-29 NOTE — Op Note (Signed)
NAME:  [name], [name]                           ACCOUNT NO.:  [id number]   MEDICAL RECORD NO.:  [id number]                   PATIENT TYPE:  INP   LOCATION:  3728                                 FACILITY:  [facility]   PHYSICIAN:  Deidre Ala, M.D.                 DATE OF BIRTH:  14-Nov-1926   DATE OF PROCEDURE:  01/13/2003  DATE OF DISCHARGE:                                 OPERATIVE REPORT   PREOPERATIVE DIAGNOSIS:  End-stage degenerative joint disease right hip.   POSTOPERATIVE DIAGNOSIS:  End-stage degenerative joint disease right hip.   PROCEDURE:  Right total hip arthroplasty using [employer] components  on both sides.   SURGEON:  Bradley Ferris, M.D.   ASSISTANT:  Madilyn Fireman, P.A.-C.   ANESTHESIA:  General endotracheal.   CULTURES:  None.   DRAINS:  Two medium Hemovacs to Autovac.   ESTIMATED BLOOD LOSS:  150 mL.  Replaced; without.   INDICATIONS FOR PROCEDURE:  The patient is a 77 year old female who has a  cardiac defibrillator placed at [facility] who presented with  right hip pain on the referral of [facility], chiropractor.  Dr. Ambrose Mantle is her cardiologist at [facility] and Barry Dienes. Eloise Harman, M.D., her  internist at [facility].  She also was admitted to  cardiology service here for perioperative treatment.  She was clear for  surgery.   PATHOLOGY:  At surgery today, we found classic hip osteoarthritis.  We were  able to fit her with a 32 mm +5 C-tapered metal head and a #8/12 distal  diameter secure fit plus HA stem and a 32 10 degree Trident size Z poly  liner dialed at about 9 o'clock and a 52 mm Trident PCL cluster cup. No  screws were used.  We had excellent stability with no push pull instability.  We could fully extend with full knee flexion.  We had stability with  extension and external rotation and then flexion with internal rotation to  about 80 degrees.  We had excellent canal fill, which she has very thick  cortices for a 77 year old, so we elected to go uncemented especially with  her cardiac history. We got a very tight acetabular fit.   DESCRIPTION OF PROCEDURE:  With adequate anesthesia obtained using  endotracheal technique, 1 gram of Ancef given IV prophylaxis and another one  on implant, the patient was placed in the left lateral decubitus position  with the right side up with the hip positioner.  After standard prepping and  draping, we made an Austin-Moore type small hip incision, slightly  curvilinear.  Incision was deepened sharply with a knife and hemostasis  obtained using Bovie electrocoagulator.  Dissection was carried down to the  iliotibial band and gluteus fascia which was incised and Charnley retractor  placed.  We then placed additional retractors around the hip, tagged and cut  the  piriformis as well as the capsule with a flat base medial.  We then  dislocated the hip.  Made our neck cut at the appropriate angle with the  template.  Then exposed the acetabulum.  Labrectomy was carried out.  Irrigation was carried out.  We then sequentially reamed up to a 52.  We  then trialed the 52, it bottomed out, then the 54 hung up on the rim, so we  felt secure with a 52 mm Trident PCL cup.  We used the screw holes just in  case we needed fixation.  We impacted it and it was extremely tight.  A  classic anteversion and abduction was done with appropriate closedown.  We  then placed a trial liner, dialed with the build-up at 9 o'clock.  We then  worked on the proximal femur and successfully reamed up with reamers to a #8  with the cylindrical reamers and broached to an 8 with the gold broach.  After doing the distal diameter reaming to a 12.5.  It was a good tight  canal fit as well as a proximal fit.  Appropriate anteversion was carried  out.  We then trialed with the gold broach with the +5 and it was secure.  We then removed the femoral component trial, we then removed the  acetabular  liner trial and placed the actual 32 10 degree Trident size Z poly liner and  impacted it.  We then impacted down the 8 to 12 distal diameter secure fit  plus HA stem and it was slightly where we had originally fit, but we went  ahead with a +5 because I thought that would bring even more stability.  We  then put the +5 C-taper 32 mm head on, reduced the hip and put it through a  range of motion as above.  The leg lengths were equal to preoperatively on  the table.  Thorough irrigation was then carried out. The capsule was then  closed back to the posterior trochanter through drill holes with a #1  Ethibond double suture, horizontal mattress and we also secured the  piriformis back. Further irrigation was carried out and the drains were  placed deep to the fascia layer and brought out the distal portal. The  fascia was closed with #1 PDS running locking and some interrupted #1  Vicryl. We then closed the subcutaneous fat with 0, 2-0, and 3-0 Vicryl and  skin staples. A bulky, sterile compressive dressing was applied with the  knee immobilizer.  The patient having tolerated the procedure well, was  awakened, taken to the recovery room in satisfactory condition for monitor  bed unit on the 3700 floor.                                               Deidre Ala, M.D.    VEP/MEDQ  D:  01/13/2003  T:  01/13/2003  Job:  [id number]   cc:   Darlin Priestly, M.D.  [address]  [place]  [place] [address]  Fax: [phone]   Richard A. Alanda Amass, M.D.  [phone] [address]  [place]  [place] [address]  Fax: [phone]   Barry Dienes. Eloise Harman, M.D.  [address]  [place]  [place] [address]  Fax: [phone]   Renelda Mom  [address]  [place]  [place] [address]  Fax: [phone]   Ambrose Mantle, M.D.  [facility]  Cardiology Office

## 2010-07-29 NOTE — Consult Note (Signed)
NAME:  [name], [name]                           ACCOUNT NO.:  [id number]   MEDICAL RECORD NO.:  [id number]                   PATIENT TYPE:  REC   LOCATION:  [facility]                                  FACILITY:  [facility]   PHYSICIAN:  Zachary George, DO                      DATE OF BIRTH:  1926/10/29   DATE OF CONSULTATION:  02/24/2002  DATE OF DISCHARGE:                                   CONSULTATION   Dear Doctor Jarold Motto,  Thank you very much for kindly referring this patient to [facility] for evaluation. The patient was seen in our  clinic today.  Please refer to the following for details regarding the  history and physical examination and treatment recommendations.  Once again,  thank you for allowing [place] to participate in the care of this patient.   CHIEF COMPLAINT:  Back pain.   HISTORY OF PRESENT ILLNESS:  The patient is a pleasant 77 year old right  hand dominant female who states that she slid in a shower approximately  three years ago with onset of low back pain pointing to her right buttock  and low back.  She states that she initially was treated by chiropractor for  nine months with relief of her symptoms for a while, then her symptoms  returned.  She was referred to Dr. Shelle Iron at [facility] who  obtained x-rays of her lumbar spine.  She was noted to have an L4-5  spondylolisthesis.  In addition, she was having shoulder pain at that time  and diagnosed with rotator cuff impingement, and she was referred for  rehabilitation which according to the patient primarily focused on her  shoulder pain and minimally on her low back pain.  Despite this, she states  that the therapy made her lower back worse.  She has significant cardiac  history and has had a defibrillator placed and is on chronic anticoagulation  with Coumadin, thus precluding any interventional procedures according to  Dr. Shelle Iron.  She was given prescriptions for Vicodin which  according to her  made her sick.  She has also been given a prescription for Vioxx which her  cardiologist does not want her taking.  She states that last week the pain  in her right buttock improved significantly, and currently she complains of  pain mainly across her shoulders and her left knee as well as pain  occasionally radiating into her bilateral calf muscles and pain still in her  right proximal buttock.  Her pain is 9/10 in a subjective scale. She  describes this as constant, achy, sharp with associated feeling of weakness.  She also complains of fatigue likely due to her cardiac history. Her  symptoms are worse with walking, working in therapy and improve with rest,  heat and ice.  She is not currently performing any home exercise program.  She has been involved in water aerobics previously but has never had any  formal aquatic physical therapy.  Her function and quality of life indices  have declined. She is unable to walk any long distances secondary to fatigue  as well as discomfort.  Her sleep is fair to good.   REVIEW OF SYSTEMS:  I reviewed health and history form of 14 point review of  systems.   PAST MEDICAL HISTORY:  1. Heart disease including cardiomyopathy status post cardiac arrest in 1999     with 12 to 20% ejection fraction.  2. Asthma.   PAST SURGICAL HISTORY:  Defibrillator implantation times two 1999 and 2001,  angiogram 2001.   FAMILY HISTORY:  Unknown, as she was adopted.   SOCIAL HISTORY:  The patient denies smoking and admits to occasional alcohol  use.  She is married and is not currently working.   ALLERGIES:  Mold, dust, pond, and pets.   MEDICATIONS:  1. Prilosec  2. Singulair  3. Mexiletine  4. Furosemide  5. Aldactone  6. Avapro  7. Toprol XL  8. Clarinex  9. Coumadin  10.      Tylenol   PHYSICAL EXAMINATION:  GENERAL:  Reveals a healthy appearing female in no  acute distress.  PELVIS:  Level without gross scoliosis. Palpatory  examination reveals  tenderness to palpation over the right sacroiliac joint with some asymmetry  compared to the left at the sacral sulcus.  There is also mild tenderness to  palpation in the lumbar paraspinous region but to a lesser degree.  Range of  motion of the lumbar spine is full on flexion with decreased extension.  UPPER AND LOWER EXTREMITIES:  Examination of the upper and lower extremities  reveals full range of motion  at the elbow, shoulders, hips and knees  without discomfort.  Manual muscle testing is 5/5 bilateral upper and lower  extremities.  Sensory examination is intact to light touch bilateral upper  and lower extremities.  Muscle stretch reflexes are 2+/4 bilateral patellar,  medial hamstrings, Achilles', biceps, triceps, brachialis, pronator teres.  Dorsalis pulses are present and equal.  No heat, erythema or edema in the  upper and lower extremities.  No abnormal tone in the upper and lower  extremities.   LABORATORY DATA:  X-rays were reviewed revealing a grade 1, L4-5  spondylolisthesis without evidence of instability on flexion and extension  views.   IMPRESSION:  1. Right sacroiliac joint pain  2. Lumbar spondylolisthesis with chronic low back pain  3. Myofascial pain component.  4. Chronic anticoagulation.   PLAN:  1. Discuss treatment options with the patient and her husband.  This was an     extensive consultation of 45 minutes duration.  We discussed medications,     interventional procedures and therapy options.  Initially, I would like     to start her on some aquatic therapy for range of motion strengthening     stretching and low to nonimpact aerobics following cardiac precautions.     This could lead to an independent program.  She was given a hand out of     aquatic therapy programs in the [place] area.   1. In terms of medications, we will continue with Tylenol just as needed at    this time.  I would consider pharmacologic management if  symptoms are not     improving with therapy.   1. Could give consideration to a sacroiliac joint injection on the right  diagnostically and therapeutically although the patient would need to be     cleared to come off her Coumadin for at least five days.  This would be     done through her cardiologist, Dr. Anner Crete office at [facility] in     [place].   1. The patient is to return to the clinic in two months for reevaluation as     needed.   1. The patient is to followup with Dr. Jarold Motto.   1. The patient was educated about findings and recommendations and     understands.  There were no barriers to communication. I have gone ahead     and given her a prescription for aquatic therapy to get her started.   Once again, thank you for allowing [place] to participate in the care of this  patient.                                                Zachary George, DO    JW/MEDQ  D:  02/24/2002  T:  02/25/2002  Job:  [id number]

## 2010-07-29 NOTE — Consult Note (Signed)
NAME:  [name], [name]                           ACCOUNT NO.:  [id number]   MEDICAL RECORD NO.:  [id number]                   PATIENT TYPE:  REC   LOCATION:  [facility]                                  FACILITY:  [facility]   PHYSICIAN:  Zachary George, DO                      DATE OF BIRTH:  08/05/1926   DATE OF CONSULTATION:  04/25/2002  DATE OF DISCHARGE:                                   CONSULTATION   The patient returns to clinic today for reevaluation.  She was initially  seen on 02/24/2002 and has been going to physical therapy for low back pain  and right sacroiliac joint pain.  She states that she has improved  considerably since I initially saw her.  The pain today is a 3/10 on  subjective scale.  She has continued to make progress in physical therapy  and requests continuation of physical therapy.  Her function and quality of  life indices have improved significantly.  Her sleep is great.  She  continues to take Tylenol as needed for pain.  I reviewed the health and  history form and 14-point Review of Systems.  The patient denies any new  neurologic complaints.   PHYSICAL EXAMINATION:  GENERAL:  Healthy-appearing female in no acute  distress.  VITAL SIGNS:  Blood pressure 124/71, pulse 59, respirations 20, O2  saturation 97% on room air.  BACK:  Minimal tenderness to palpation of the lumbar paraspinal muscles.  There is no tenderness over the right sacroiliac joint today.  NEUROLOGIC:  Manual muscle testing is 5/5 bilateral lower extremities.  Sensory examination is intact to light touch bilateral lower extremities.  Muscle stretch reflexes are symmetric bilateral lower extremities.  Straight  leg raise is negative bilaterally.  Fabere is negative bilaterally.  The  patient does have tight hamstrings and tight hip flexors, right greater than  left.   IMPRESSION:  1. Chronic low back pain.  2. Right sacroiliac joint pain significantly improved.  3. Lumbar spondylolisthesis.   PLAN:  1. Continue physical therapy.  New prescription written for range of motion,     stretching, strengthening, lumbar stabilization exercises, and     consideration for aquatic program leading to a home exercise program and     independent aquatic program two to three times a week for four weeks.  2.     Continue Tylenol as needed for pain control.  3. The patient is to return to clinic on an as-needed basis.   The patient was educated about findings and recommendations and understands.  No barriers to communication.                                               Zachary George, DO  JW/MEDQ  D:  04/25/2002  T:  04/25/2002  Job:  [id number]   cc:   Barry Dienes. Eloise Harman, M.D.  [address]  [place]  [place] [address]  Fax: [phone]

## 2010-09-22 ENCOUNTER — Encounter (INDEPENDENT_AMBULATORY_CARE_PROVIDER_SITE_OTHER): Payer: Medicare Other | Admitting: *Deleted

## 2010-09-22 ENCOUNTER — Encounter: Payer: Medicare Other | Admitting: Internal Medicine

## 2010-09-22 ENCOUNTER — Other Ambulatory Visit: Payer: Self-pay | Admitting: Internal Medicine

## 2010-09-22 DIAGNOSIS — I509 Heart failure, unspecified: Secondary | ICD-10-CM

## 2010-09-26 LAB — PACEMAKER DEVICE OBSERVATION
ATRIAL PACING PM: 97
DEVICE MODEL PM: 108561
LV LEAD THRESHOLD: 0.6 V
LV LEAD THRESHOLD: 1 V
LV LEAD THRESHOLD: 1 V
RV LEAD THRESHOLD: 1.8 V

## 2010-10-19 LAB — REMOTE ICD DEVICE
AL IMPEDENCE ICD: 390 Ohm
CHARGE TIME: 12 s
MODE SWITCH EPISODES: 0
PACEART VT: 0
TOT-0006: 20120223000000
TZAT-0002FASTVT: NEGATIVE
TZAT-0013FASTVT: 3
TZAT-0018FASTVT: NEGATIVE
TZAT-0018FASTVT: NEGATIVE
TZST-0001FASTVT: 5
TZST-0001FASTVT: 6
TZST-0003FASTVT: 11 J
VENTRICULAR PACING ICD: 88 pct
VF: 0

## 2011-01-05 ENCOUNTER — Encounter: Payer: Self-pay | Admitting: Internal Medicine

## 2011-01-05 ENCOUNTER — Ambulatory Visit (INDEPENDENT_AMBULATORY_CARE_PROVIDER_SITE_OTHER): Payer: Medicare Other | Admitting: Internal Medicine

## 2011-01-05 DIAGNOSIS — I428 Other cardiomyopathies: Secondary | ICD-10-CM

## 2011-01-05 DIAGNOSIS — I4891 Unspecified atrial fibrillation: Secondary | ICD-10-CM

## 2011-01-05 DIAGNOSIS — Z95810 Presence of automatic (implantable) cardiac defibrillator: Secondary | ICD-10-CM

## 2011-01-05 DIAGNOSIS — R0602 Shortness of breath: Secondary | ICD-10-CM

## 2011-01-05 DIAGNOSIS — I509 Heart failure, unspecified: Secondary | ICD-10-CM

## 2011-01-05 DIAGNOSIS — I5022 Chronic systolic (congestive) heart failure: Secondary | ICD-10-CM

## 2011-01-05 LAB — BASIC METABOLIC PANEL
CO2: 22 mEq/L (ref 19–32)
Calcium: 9 mg/dL (ref 8.4–10.5)
Creatinine, Ser: 1.7 mg/dL — ABNORMAL HIGH (ref 0.4–1.2)
Glucose, Bld: 110 mg/dL — ABNORMAL HIGH (ref 70–99)
Sodium: 137 mEq/L (ref 135–145)

## 2011-01-05 LAB — PACEMAKER DEVICE OBSERVATION
ATRIAL PACING PM: 95
LV LEAD IMPEDENCE PM: 653 Ohm
LV LEAD THRESHOLD: 0.6 V
LV LEAD THRESHOLD: 0.8 V
LV LEAD THRESHOLD: 1.2 V
RV LEAD THRESHOLD: 0.6 V
VENTRICULAR PACING PM: 95

## 2011-01-05 NOTE — Patient Instructions (Signed)
Your physician has recommended you make the following change in your medication:  1) Stop Toprol. 2) Stop Aspirin.  Your physician recommends that you have lab work today: bmp/bnp/tsh (427.31;428.0).  Your physician has requested that you have an AV optimization echocardiogram. Echocardiography is a painless test that uses sound waves to create images of your heart. It provides your doctor with information about the size and shape of your heart and how well your heart's chambers and valves are working. This procedure takes approximately one hour. There are no restrictions for this procedure.  Your physician wants you to follow-up in: 6 months with Kristin/Paula for a device check & 1 year with Dr. Graciela Husbands. You will receive a reminder letter in the mail two months in advance. If you don't receive a letter, please call our office to schedule the follow-up appointment.

## 2011-01-05 NOTE — Assessment & Plan Note (Signed)
She is primarily limited by fatigue. There is not excessive volume. We will look at left ventricular function and attempt AV optimization

## 2011-01-05 NOTE — Assessment & Plan Note (Signed)
The patient's device was interrogated.  The information was reviewed.  She was ventricularly sensed 5% of the time. This is likely an underestimate given the role effusion. We changed her dynamic AV delay from 140-100 ro 140-90

## 2011-01-05 NOTE — Assessment & Plan Note (Signed)
We have no data on left ventricular function done recently. We have a number of 20% from her evaluation in [facility] we will repeat that.A.: We will check a BNP today

## 2011-01-05 NOTE — Progress Notes (Signed)
HPI  [name] is a 77 y.o. female Patient of Dr. Lubertha Basque who underwent a device generator replacement in the spring of this yearwith a CRT D. Device replaced with a CRT P. Device. The procedure was complicated by a hematoma that was successfully managed noninvasively with Elastoplast dressing  She says that she has had progressive loss of energy and dyspnea on exertion. She has not had peripheral edema. Past Medical History  Diagnosis Date  . Benign neoplasm of colon   . Diverticulosis of colon (without mention of hemorrhage)   . Reflux esophagitis   . Gastric polyp     history of  . GERD (gastroesophageal reflux disease)   . Stricture and stenosis of esophagus   . Reactive airway disease   . Anemia, iron deficiency   . CHF (congestive heart failure)   . Restless leg syndrome   . Hypercholesterolemia   . Nonischemic cardiomyopathy     EF 20%  . Personal history of sudden cardiac death successfully resuscitated     status post cardioverter- defibrillator  . Atrial fibrillation   . Diabetes mellitus   . Osteoarthritis     Past Surgical History  Procedure Date  . Cardiac defibrillator placement 1999, 2001, 3.30.2012    [facility], Removal of previously implanted BiV ICD followed by insertion of BiV pacemaker with pacemaker pocket revision. without immediate procedural complications.   . Eye surgery 2002    cataract   . Total hip arthroplasty 2004    right    Current Outpatient Prescriptions  Medication Sig Dispense Refill  . acetaminophen (TYLENOL) 325 MG tablet Take 650 mg by mouth every 6 (six) hours as needed.        Marland Kitchen aspirin 81 MG tablet Take 81 mg by mouth daily.        Marland Kitchen azelastine (OPTIVAR) 0.05 % ophthalmic solution Place 1 drop into both eyes 2 (two) times daily.        Marland Kitchen desloratadine (CLARINEX) 5 MG tablet Take 5 mg by mouth daily.        . fish oil-omega-3 fatty acids 1000 MG capsule Take 2 g by mouth daily.        . furosemide (LASIX) 40 MG  tablet Take 40 mg by mouth 2 (two) times daily.       . irbesartan (AVAPRO) 75 MG tablet Take 75 mg by mouth at bedtime.        Marland Kitchen levothyroxine (SYNTHROID, LEVOTHROID) 100 MCG tablet Take 100 mcg by mouth daily.        . metoprolol (TOPROL-XL) 50 MG 24 hr tablet Take 50 mg by mouth daily.        . montelukast (SINGULAIR) 10 MG tablet Take 10 mg by mouth at bedtime.        . Pancrelipase, Lip-Prot-Amyl, (CREON) 12000 UNITS CPEP Take 3 capsules by mouth daily.        Bertram Gala Glycol-Propyl Glycol (SYSTANE ULTRA OP) Apply 3 drops to eye 3 (three) times daily.        . sotalol (BETAPACE) 80 MG tablet Take 80 mg by mouth 2 (two) times daily.        Marland Kitchen SOTALOL AF 80 MG TABS Take 80 mg by mouth 2 (two) times daily.        Marland Kitchen spironolactone (ALDACTONE) 25 MG tablet Take 25 mg by mouth daily.        Marland Kitchen warfarin (COUMADIN) 4 MG tablet Take 4 mg by mouth daily.  Allergies  Allergen Reactions  . Iodinated Diagnostic Agents     Review of Systems negative except from HPI and PMH  Physical Exam Well developed and well nourished in no acute distress HENT normal E scleral and icterus clear Neck Supple JVP flat; carotids brisk and full Clear to ausculation Regular rate and rhythm,2/ 6 murmur along the right upper sternal birth Soft with active bowel sounds No clubbing cyanosis and edema Alert and oriented, grossly normal motor and sensory function Skin Warm and Dry  ECG sinus rhythm with P. Synchronous biventricular pacing  Assessment and  Plan

## 2011-01-05 NOTE — Assessment & Plan Note (Signed)
Evidence of atrial fibrillation. We will discontinue her aspirin. We will measure her left ventricular function and try to discern whether sotalol is appropriate. We also need to check renal function to assess appropriate clearances.

## 2011-01-20 ENCOUNTER — Other Ambulatory Visit: Payer: Self-pay

## 2011-01-20 DIAGNOSIS — I4891 Unspecified atrial fibrillation: Secondary | ICD-10-CM

## 2011-01-20 DIAGNOSIS — R799 Abnormal finding of blood chemistry, unspecified: Secondary | ICD-10-CM

## 2011-01-30 ENCOUNTER — Other Ambulatory Visit (INDEPENDENT_AMBULATORY_CARE_PROVIDER_SITE_OTHER): Payer: Medicare Other | Admitting: *Deleted

## 2011-01-30 ENCOUNTER — Ambulatory Visit (INDEPENDENT_AMBULATORY_CARE_PROVIDER_SITE_OTHER): Payer: Medicare Other | Admitting: *Deleted

## 2011-01-30 ENCOUNTER — Ambulatory Visit (HOSPITAL_COMMUNITY): Payer: Medicare Other | Attending: Cardiology | Admitting: Radiology

## 2011-01-30 DIAGNOSIS — R7989 Other specified abnormal findings of blood chemistry: Secondary | ICD-10-CM

## 2011-01-30 DIAGNOSIS — I4891 Unspecified atrial fibrillation: Secondary | ICD-10-CM

## 2011-01-30 DIAGNOSIS — I059 Rheumatic mitral valve disease, unspecified: Secondary | ICD-10-CM | POA: Insufficient documentation

## 2011-01-30 DIAGNOSIS — I428 Other cardiomyopathies: Secondary | ICD-10-CM

## 2011-01-30 DIAGNOSIS — I379 Nonrheumatic pulmonary valve disorder, unspecified: Secondary | ICD-10-CM | POA: Insufficient documentation

## 2011-01-30 DIAGNOSIS — E119 Type 2 diabetes mellitus without complications: Secondary | ICD-10-CM | POA: Insufficient documentation

## 2011-01-30 DIAGNOSIS — R799 Abnormal finding of blood chemistry, unspecified: Secondary | ICD-10-CM

## 2011-01-30 DIAGNOSIS — Z950 Presence of cardiac pacemaker: Secondary | ICD-10-CM

## 2011-01-30 DIAGNOSIS — Z95810 Presence of automatic (implantable) cardiac defibrillator: Secondary | ICD-10-CM

## 2011-01-30 DIAGNOSIS — I079 Rheumatic tricuspid valve disease, unspecified: Secondary | ICD-10-CM | POA: Insufficient documentation

## 2011-01-30 DIAGNOSIS — I5022 Chronic systolic (congestive) heart failure: Secondary | ICD-10-CM

## 2011-01-30 DIAGNOSIS — I509 Heart failure, unspecified: Secondary | ICD-10-CM | POA: Insufficient documentation

## 2011-01-30 LAB — BASIC METABOLIC PANEL
CO2: 25 mEq/L (ref 19–32)
Chloride: 102 mEq/L (ref 96–112)
Glucose, Bld: 98 mg/dL (ref 70–99)
Potassium: 3.9 mEq/L (ref 3.5–5.1)
Sodium: 139 mEq/L (ref 135–145)

## 2011-01-30 LAB — PACEMAKER DEVICE OBSERVATION
BAMS-0003: 70 {beats}/min
DEVICE MODEL PM: 108561

## 2011-01-30 NOTE — Progress Notes (Signed)
AV opt echo done by [facility]

## 2011-02-09 NOTE — Progress Notes (Signed)
Addended by: Debbe Bales on: 02/09/2011 03:32 PM   Modules accepted: Orders

## 2011-02-14 ENCOUNTER — Telehealth: Payer: Self-pay | Admitting: Internal Medicine

## 2011-02-14 DIAGNOSIS — I4891 Unspecified atrial fibrillation: Secondary | ICD-10-CM

## 2011-02-14 MED ORDER — AMIODARONE HCL 200 MG PO TABS: ORAL_TABLET | ORAL | Status: DC

## 2011-02-14 NOTE — Telephone Encounter (Signed)
I have spoken with the patient. She is aware of Dr. Odessa Fleming recommendations to d/c sotalol and start amiodarone 200mg  BID. She wants this through [facility]. I explained I will order this for her. She is to call me back when she receives her amiodarone dose so I will know when to have her coumadin rechecked. We will decrease her coumadin to 4mg  1/2 tablet daily as she is taking 2mg  every day except for 4mg  two days a week.

## 2011-02-14 NOTE — Telephone Encounter (Signed)
Routing to SK. 

## 2011-02-14 NOTE — Telephone Encounter (Signed)
With mrkd depression of LF systolic function would stop sotalol and begin amdioarone   We will need to adjust coumadinand at first blush, would decrease 4mg  daily to 4mg   Alternating with 2 mg  She would need coumadin check 5 days afterwards

## 2011-02-14 NOTE — Telephone Encounter (Signed)
The patient had an AV opt echo. Will review with Dr. Graciela Husbands prior to calling the patient.

## 2011-02-14 NOTE — Telephone Encounter (Signed)
Ok to give results   Not unexpected Thanks steve

## 2011-02-14 NOTE — Telephone Encounter (Signed)
Fu call Pt was calling about echocardiogram results please call her back

## 2011-02-24 ENCOUNTER — Telehealth: Payer: Self-pay | Admitting: Internal Medicine

## 2011-02-24 NOTE — Telephone Encounter (Signed)
I spoke with the patient. She has received her amiodarone. She will stop sotalol and start amiodarone. She has been instructed to decrease her coumadin dose and follow up with Dr. Norval Gable office by Wednesday 12/19 for an INR check. She will follow up with Dr. Graciela Husbands on 03/17/11.

## 2011-02-24 NOTE — Telephone Encounter (Signed)
Pt calling re meds are different than what she thought he put her on, pls call

## 2011-03-17 ENCOUNTER — Encounter: Payer: Medicare Other | Admitting: Internal Medicine

## 2011-03-21 ENCOUNTER — Encounter: Payer: Self-pay | Admitting: Internal Medicine

## 2011-09-04 ENCOUNTER — Other Ambulatory Visit: Payer: Self-pay

## 2011-09-04 ENCOUNTER — Encounter: Payer: Self-pay | Admitting: Internal Medicine

## 2011-09-04 ENCOUNTER — Ambulatory Visit (INDEPENDENT_AMBULATORY_CARE_PROVIDER_SITE_OTHER): Payer: Medicare Other | Admitting: Cardiology

## 2011-09-04 DIAGNOSIS — I5022 Chronic systolic (congestive) heart failure: Secondary | ICD-10-CM

## 2011-09-04 DIAGNOSIS — Z950 Presence of cardiac pacemaker: Secondary | ICD-10-CM

## 2011-09-04 NOTE — Progress Notes (Signed)
Device check only. See PaceArt report. 

## 2011-09-04 NOTE — Patient Instructions (Addendum)
Your physician wants you to follow-up in: 6 months with Dr. Klein. You will receive a reminder letter in the mail two months in advance. If you don't receive a letter, please call our office to schedule the follow-up appointment.  

## 2011-09-05 ENCOUNTER — Ambulatory Visit: Payer: Medicare Other | Admitting: Cardiology

## 2011-09-05 LAB — PACEMAKER DEVICE OBSERVATION
AL IMPEDENCE PM: 410 Ohm
BAMS-0001: 150 {beats}/min
BAMS-0003: 70 {beats}/min
DEVICE MODEL PM: 108561
LV LEAD THRESHOLD: 1.2 V
RV LEAD AMPLITUDE: 9.5 mv
RV LEAD THRESHOLD: 0.4 V
VENTRICULAR PACING PM: 95

## 2011-10-14 ENCOUNTER — Encounter (HOSPITAL_COMMUNITY): Payer: Self-pay | Admitting: *Deleted

## 2011-10-14 ENCOUNTER — Inpatient Hospital Stay (HOSPITAL_COMMUNITY)
Admission: EM | Admit: 2011-10-14 | Discharge: 2011-10-17 | DRG: 690 | Disposition: A | Discharge status: Skilled Nursing Facility | Payer: Medicare Other | Attending: Internal Medicine | Admitting: Internal Medicine

## 2011-10-14 ENCOUNTER — Emergency Department (HOSPITAL_COMMUNITY): Payer: Medicare Other

## 2011-10-14 DIAGNOSIS — R791 Abnormal coagulation profile: Secondary | ICD-10-CM | POA: Diagnosis present

## 2011-10-14 DIAGNOSIS — R5383 Other fatigue: Secondary | ICD-10-CM | POA: Diagnosis present

## 2011-10-14 DIAGNOSIS — I4892 Unspecified atrial flutter: Secondary | ICD-10-CM | POA: Diagnosis present

## 2011-10-14 DIAGNOSIS — N179 Acute kidney failure, unspecified: Secondary | ICD-10-CM | POA: Diagnosis present

## 2011-10-14 DIAGNOSIS — I10 Essential (primary) hypertension: Secondary | ICD-10-CM | POA: Diagnosis present

## 2011-10-14 DIAGNOSIS — I5022 Chronic systolic (congestive) heart failure: Secondary | ICD-10-CM | POA: Diagnosis present

## 2011-10-14 DIAGNOSIS — Z95810 Presence of automatic (implantable) cardiac defibrillator: Secondary | ICD-10-CM

## 2011-10-14 DIAGNOSIS — Y92009 Unspecified place in unspecified non-institutional (private) residence as the place of occurrence of the external cause: Secondary | ICD-10-CM

## 2011-10-14 DIAGNOSIS — K219 Gastro-esophageal reflux disease without esophagitis: Secondary | ICD-10-CM | POA: Diagnosis present

## 2011-10-14 DIAGNOSIS — R5381 Other malaise: Secondary | ICD-10-CM | POA: Diagnosis present

## 2011-10-14 DIAGNOSIS — R531 Weakness: Secondary | ICD-10-CM

## 2011-10-14 DIAGNOSIS — I509 Heart failure, unspecified: Secondary | ICD-10-CM | POA: Diagnosis present

## 2011-10-14 DIAGNOSIS — Z7901 Long term (current) use of anticoagulants: Secondary | ICD-10-CM

## 2011-10-14 DIAGNOSIS — E860 Dehydration: Secondary | ICD-10-CM

## 2011-10-14 DIAGNOSIS — Z96649 Presence of unspecified artificial hip joint: Secondary | ICD-10-CM

## 2011-10-14 DIAGNOSIS — I4891 Unspecified atrial fibrillation: Secondary | ICD-10-CM | POA: Diagnosis present

## 2011-10-14 DIAGNOSIS — R296 Repeated falls: Secondary | ICD-10-CM | POA: Diagnosis present

## 2011-10-14 DIAGNOSIS — I428 Other cardiomyopathies: Secondary | ICD-10-CM | POA: Diagnosis present

## 2011-10-14 DIAGNOSIS — E119 Type 2 diabetes mellitus without complications: Secondary | ICD-10-CM | POA: Diagnosis present

## 2011-10-14 DIAGNOSIS — N390 Urinary tract infection, site not specified: Principal | ICD-10-CM | POA: Diagnosis present

## 2011-10-14 DIAGNOSIS — E876 Hypokalemia: Secondary | ICD-10-CM | POA: Diagnosis present

## 2011-10-14 LAB — URINALYSIS, ROUTINE W REFLEX MICROSCOPIC
Ketones, ur: 15 mg/dL — AB
Specific Gravity, Urine: 1.016 (ref 1.005–1.030)
Urobilinogen, UA: 0.2 mg/dL (ref 0.0–1.0)

## 2011-10-14 LAB — CBC WITH DIFFERENTIAL/PLATELET
Basophils Relative: 0 % (ref 0–1)
Eosinophils Absolute: 0 10*3/uL (ref 0.0–0.7)
HCT: 36.8 % (ref 36.0–46.0)
Hemoglobin: 12.5 g/dL (ref 12.0–15.0)
MCH: 34.1 pg — ABNORMAL HIGH (ref 26.0–34.0)
MCHC: 34 g/dL (ref 30.0–36.0)
MCV: 100.3 fL — ABNORMAL HIGH (ref 78.0–100.0)
Monocytes Absolute: 1.2 10*3/uL — ABNORMAL HIGH (ref 0.1–1.0)
Monocytes Relative: 7 % (ref 3–12)

## 2011-10-14 LAB — COMPREHENSIVE METABOLIC PANEL
ALT: 52 U/L — ABNORMAL HIGH (ref 0–35)
AST: 30 U/L (ref 0–37)
Alkaline Phosphatase: 106 U/L (ref 39–117)
CO2: 22 mEq/L (ref 19–32)
Calcium: 9.2 mg/dL (ref 8.4–10.5)
Chloride: 98 mEq/L (ref 96–112)
GFR calc non Af Amer: 27 mL/min — ABNORMAL LOW (ref >90)

## 2011-10-14 LAB — APTT: aPTT: 47 seconds — ABNORMAL HIGH (ref 24–37)

## 2011-10-14 LAB — POCT I-STAT TROPONIN I

## 2011-10-14 LAB — URINE MICROSCOPIC-ADD ON

## 2011-10-14 LAB — GLUCOSE, CAPILLARY

## 2011-10-14 LAB — PROTIME-INR: INR: 1.62 — ABNORMAL HIGH (ref 0.00–1.49)

## 2011-10-14 MED ORDER — TIOTROPIUM BROMIDE MONOHYDRATE 18 MCG IN CAPS
18.0000 ug | ORAL_CAPSULE | Freq: Every day | RESPIRATORY_TRACT | Status: DC
  Administered 2011-10-15 – 2011-10-16 (×2): 18 ug via RESPIRATORY_TRACT
  Filled 2011-10-14: qty 5

## 2011-10-14 MED ORDER — AMIODARONE HCL 200 MG PO TABS
200.0000 mg | ORAL_TABLET | Freq: Two times a day (BID) | ORAL | Status: DC
  Administered 2011-10-14 – 2011-10-17 (×6): 200 mg via ORAL
  Filled 2011-10-14 (×7): qty 1

## 2011-10-14 MED ORDER — OMEGA-3-ACID ETHYL ESTERS 1 G PO CAPS
2.0000 g | ORAL_CAPSULE | Freq: Every day | ORAL | Status: DC
  Administered 2011-10-15 – 2011-10-17 (×3): 2 g via ORAL
  Filled 2011-10-14 (×3): qty 2

## 2011-10-14 MED ORDER — ACETAMINOPHEN 325 MG PO TABS
650.0000 mg | ORAL_TABLET | Freq: Four times a day (QID) | ORAL | Status: DC | PRN
Start: 2011-10-14 — End: 2011-10-14

## 2011-10-14 MED ORDER — ACETAMINOPHEN 650 MG RE SUPP: 650.0000 mg | Freq: Four times a day (QID) | RECTAL | Status: DC | PRN

## 2011-10-14 MED ORDER — WARFARIN SODIUM 4 MG PO TABS
4.0000 mg | ORAL_TABLET | ORAL | Status: AC
  Administered 2011-10-14: 4 mg via ORAL
  Filled 2011-10-14: qty 1

## 2011-10-14 MED ORDER — DEXTROSE 5 % IV SOLN
1.0000 g | INTRAVENOUS | Status: DC
  Administered 2011-10-15: 1 g via INTRAVENOUS
  Filled 2011-10-14 (×2): qty 10

## 2011-10-14 MED ORDER — PANCRELIPASE (LIP-PROT-AMYL) 12000-38000 UNITS PO CPEP
1.0000 | ORAL_CAPSULE | Freq: Three times a day (TID) | ORAL | Status: DC
  Administered 2011-10-15 – 2011-10-17 (×8): 1 via ORAL
  Filled 2011-10-14 (×10): qty 1

## 2011-10-14 MED ORDER — FUROSEMIDE 80 MG PO TABS
80.0000 mg | ORAL_TABLET | Freq: Every day | ORAL | Status: DC
  Administered 2011-10-15 – 2011-10-17 (×3): 80 mg via ORAL
  Filled 2011-10-14 (×5): qty 1

## 2011-10-14 MED ORDER — MONTELUKAST SODIUM 10 MG PO TABS
10.0000 mg | ORAL_TABLET | Freq: Every day | ORAL | Status: DC
  Administered 2011-10-14 – 2011-10-16 (×3): 10 mg via ORAL
  Filled 2011-10-14 (×4): qty 1

## 2011-10-14 MED ORDER — WARFARIN - PHARMACIST DOSING INPATIENT: Freq: Every day | Status: DC

## 2011-10-14 MED ORDER — DEXTROSE 5 % IV SOLN
1.0000 g | Freq: Once | INTRAVENOUS | Status: AC
  Administered 2011-10-14: 1 g via INTRAVENOUS
  Filled 2011-10-14: qty 10

## 2011-10-14 MED ORDER — LEVOTHYROXINE SODIUM 100 MCG PO TABS
100.0000 ug | ORAL_TABLET | Freq: Every day | ORAL | Status: DC
  Administered 2011-10-15 – 2011-10-17 (×3): 100 ug via ORAL
  Filled 2011-10-14 (×4): qty 1

## 2011-10-14 MED ORDER — SPIRONOLACTONE 25 MG PO TABS
25.0000 mg | ORAL_TABLET | Freq: Every day | ORAL | Status: DC
  Administered 2011-10-15 – 2011-10-17 (×3): 25 mg via ORAL
  Filled 2011-10-14 (×3): qty 1

## 2011-10-14 MED ORDER — IRBESARTAN 75 MG PO TABS
75.0000 mg | ORAL_TABLET | Freq: Every day | ORAL | Status: DC
  Administered 2011-10-15 – 2011-10-16 (×2): 75 mg via ORAL
  Filled 2011-10-14 (×4): qty 1

## 2011-10-14 MED ORDER — POLYVINYL ALCOHOL 1.4 % OP SOLN
3.0000 [drp] | Freq: Three times a day (TID) | OPHTHALMIC | Status: DC
  Administered 2011-10-14 – 2011-10-17 (×8): 3 [drp] via OPHTHALMIC
  Filled 2011-10-14: qty 15

## 2011-10-14 MED ORDER — OLOPATADINE HCL 0.1 % OP SOLN
1.0000 [drp] | Freq: Two times a day (BID) | OPHTHALMIC | Status: DC
  Administered 2011-10-15 – 2011-10-17 (×5): 1 [drp] via OPHTHALMIC
  Filled 2011-10-14: qty 5

## 2011-10-14 MED ORDER — LORATADINE 10 MG PO TABS
10.0000 mg | ORAL_TABLET | Freq: Every day | ORAL | Status: DC
  Administered 2011-10-15 – 2011-10-17 (×3): 10 mg via ORAL
  Filled 2011-10-14 (×3): qty 1

## 2011-10-14 MED ORDER — FUROSEMIDE 40 MG PO TABS: 40.0000 mg | ORAL_TABLET | Freq: Two times a day (BID) | ORAL | Status: DC

## 2011-10-14 MED ORDER — SODIUM CHLORIDE 0.9 % IV BOLUS (SEPSIS)
500.0000 mL | Freq: Once | INTRAVENOUS | Status: AC
  Administered 2011-10-14: 500 mL via INTRAVENOUS

## 2011-10-14 MED ORDER — ACETAMINOPHEN 325 MG PO TABS
650.0000 mg | ORAL_TABLET | Freq: Four times a day (QID) | ORAL | Status: DC | PRN
  Administered 2011-10-14 – 2011-10-17 (×6): 650 mg via ORAL
  Filled 2011-10-14 (×6): qty 2

## 2011-10-14 MED ORDER — SODIUM CHLORIDE 0.9 % IJ SOLN
3.0000 mL | Freq: Two times a day (BID) | INTRAMUSCULAR | Status: DC
  Administered 2011-10-15 – 2011-10-17 (×4): 3 mL via INTRAVENOUS

## 2011-10-14 MED ORDER — FUROSEMIDE 40 MG PO TABS
40.0000 mg | ORAL_TABLET | Freq: Every day | ORAL | Status: DC
  Administered 2011-10-15: 40 mg via ORAL
  Filled 2011-10-14 (×3): qty 1

## 2011-10-14 MED ORDER — POTASSIUM CHLORIDE IN NACL 20-0.45 MEQ/L-% IV SOLN
INTRAVENOUS | Status: DC
  Administered 2011-10-15: via INTRAVENOUS
  Filled 2011-10-14 (×4): qty 1000

## 2011-10-14 MED ORDER — PANTOPRAZOLE SODIUM 40 MG PO TBEC
40.0000 mg | DELAYED_RELEASE_TABLET | Freq: Every day | ORAL | Status: DC
  Administered 2011-10-15 – 2011-10-17 (×3): 40 mg via ORAL
  Filled 2011-10-14 (×3): qty 1

## 2011-10-14 NOTE — ED Notes (Signed)
The pt arrived by gems from home where she lives alone.  She has had some weakness for 2 days and she lost her balance just pta and fell in the br.  She arrived on a lsb with c-collar.  C/o lain in her entire spine.  Alert oriented on arrival to the ed

## 2011-10-14 NOTE — ED Notes (Signed)
I placed a 14 french foley in the patient per Dr. Ranae Palms.

## 2011-10-14 NOTE — ED Notes (Signed)
TRANSPORTED TO FLOOR IN STABLE CONDITION , IV AND FOLEY CATHETER IS INTACT . NO PAIN . RESPIRATIONS UNLABORED.

## 2011-10-14 NOTE — ED Notes (Signed)
DR. Felipa Eth AT BEDSIDE SPEAKING WITH PT./ FAMILY AT BEDSIDE .

## 2011-10-14 NOTE — Progress Notes (Signed)
ANTICOAGULATION CONSULT NOTE - Initial Consult  Pharmacy Consult for Coumadin Indication: atrial fibrillation  Allergies  Allergen Reactions  . Iodinated Diagnostic Agents     Unknown    Patient Measurements: Height: 5\' 4"  (162.6 cm) Weight: 181 lb 10.5 oz (82.4 kg) (bed weight) IBW/kg (Calculated) : 54.7   Vital Signs: Temp: 99.9 F (37.7 C) (08/03 2228) Temp src: Oral (08/03 2228) BP: 139/72 mmHg (08/03 2228) Pulse Rate: 69  (08/03 2228)  Labs:  Basename 10/14/11 1625  HGB 12.5  HCT 36.8  PLT 159  APTT 47*  LABPROT 19.5*  INR 1.62*  HEPARINUNFRC --  CREATININE 1.67*  CKTOTAL --  CKMB --  TROPONINI --    Estimated Creatinine Clearance: 26.5 ml/min (by C-G formula based on Cr of 1.67).   Medical History: Past Medical History  Diagnosis Date  . Benign neoplasm of colon   . Diverticulosis of colon (without mention of hemorrhage)   . Reflux esophagitis   . Gastric polyp     history of  . GERD (gastroesophageal reflux disease)   . Stricture and stenosis of esophagus   . Reactive airway disease   . Anemia, iron deficiency   . CHF (congestive heart failure)   . Restless leg syndrome   . Hypercholesterolemia   . Nonischemic cardiomyopathy     EF 20%  . Personal history of sudden cardiac death successfully resuscitated     status post cardioverter- defibrillator  . Atrial fibrillation   . Diabetes mellitus   . Osteoarthritis   . ICD (implantable cardiac defibrillator) in place     Medications:  Prescriptions prior to admission  Medication Sig Dispense Refill  . acetaminophen (TYLENOL) 325 MG tablet Take 650 mg by mouth every 6 (six) hours as needed. For pain      . amiodarone (PACERONE) 200 MG tablet Take one tablet by mouth twice daily.  180 tablet  3  . azelastine (OPTIVAR) 0.05 % ophthalmic solution Place 1 drop into both eyes 2 (two) times daily.        Marland Kitchen desloratadine (CLARINEX) 5 MG tablet Take 5 mg by mouth daily.        . fish oil-omega-3  fatty acids 1000 MG capsule Take 2 g by mouth daily.        . furosemide (LASIX) 40 MG tablet Take 40-80 mg by mouth 2 (two) times daily. Take 80 mg in the morning and 40 mg in the evening      . irbesartan (AVAPRO) 75 MG tablet Take 75 mg by mouth at bedtime.        Marland Kitchen levothyroxine (SYNTHROID, LEVOTHROID) 100 MCG tablet Take 100 mcg by mouth daily.        . montelukast (SINGULAIR) 10 MG tablet Take 10 mg by mouth at bedtime.        . Pancrelipase, Lip-Prot-Amyl, (CREON) 12000 UNITS CPEP Take 1 capsule by mouth 3 (three) times daily before meals.       Bertram Gala Glycol-Propyl Glycol (SYSTANE ULTRA OP) Apply 3 drops to eye 3 (three) times daily.        Marland Kitchen spironolactone (ALDACTONE) 25 MG tablet Take 25 mg by mouth daily.        Marland Kitchen warfarin (COUMADIN) 4 MG tablet Take 2-4 mg by mouth daily. Take 2 mg except on Thursday take 4 mg       Scheduled:    . amiodarone  200 mg Oral BID  . azelastine  1 drop Both Eyes BID  .  cefTRIAXone (ROCEPHIN)  IV  1 g Intravenous Once  . cefTRIAXone (ROCEPHIN)  IV  1 g Intravenous Q24H  . fish oil-omega-3 fatty acids  2 g Oral Daily  . furosemide  40-80 mg Oral BID  . irbesartan  75 mg Oral QHS  . levothyroxine  100 mcg Oral Daily  . lipase/protease/amylase  1 capsule Oral TID AC  . loratadine  10 mg Oral Daily  . montelukast  10 mg Oral QHS  . pantoprazole  40 mg Oral Q0600  . Polyethyl Glycol-Propyl Glycol  3 drop Both Eyes TID  . sodium chloride  500 mL Intravenous Once  . sodium chloride  3 mL Intravenous Q12H  . spironolactone  25 mg Oral Daily  . tiotropium  18 mcg Inhalation Daily  . warfarin  4 mg Oral NOW  . Warfarin - Pharmacist Dosing Inpatient   Does not apply q1800    Assessment: 78yo female admitted s/p fall due to generalized weakness, found with UTI and hypotension, to continue Coumadin for Afib; admitted with subtherapeutic INR; CT negative for bleed.  Goal of Therapy:  INR 2-3   Plan:  Will give boosted Coumadin dose of 4mg  x1  tonight and monitor INR for dose adjustments.  Colleen Can PharmD BCPS 10/14/2011,10:52 PM

## 2011-10-14 NOTE — H&P (Signed)
PCP:   Garlan Fillers, MD   Chief Complaint:  Generalized weakness with fall striking the back of her head  HPI: Patient presents from home with a two-day history of generalized weakness, with one fall fall using her walker, striking the back of her head without loss of consciousness earlier today, transported to the emergency room by EMS. In retrospect patient states that her appetite has been suppressed for approximately 2 days associated with fevers and chills and general weakness. She denies any chest pain, palpitations, shortness of breath, cough, overt vomiting, change in bowel habits, bleeding complications from her Coumadin specifically no issues with blood in the urine or stool. She does live at home independently with the support of her daughter who lives locally, does have a housekeeper who comes in on a regular basis and for the most part is able to perform most of her ADLs with support of her housekeeper. Does not drive secondary to macular degeneration. Has no issues with taking her medications and has been compliant with the same however has been progressively weak her office visit notes over the last several weeks if not months, and apparently family has brought up the role for question of transfer to assisted living facility for closer management. In the emergency room, patient hemodynamically stable, head CT unremarkable for internal bleeding while on Coumadin given fall, neurologically intact, labs is significant for possibly mild prerenal azotemia as well as urinary tract infection severe. Patient week, given IV fluids, despite the administration of antibiotics and IV patient continued to feel weak and was unable to transfer home in a safe manner. She is now being admitted for further evaluation and management of multiple medical problems including generalized weakness, physical deconditioning, significant urinary tract infection in the setting of cardiomyopathy and  anticoagulation.  Review of Systems:  Positive for fevers, chills, negative for any weight changes, positive for anorexia but no overt vomiting, change in bowel habits, blood in stool or urine, negative for any focal neurological deficits, specifically negative for any palpitations, chest pain, or worsening shortness of breath but chronic issues with dyspnea on exertion. Lower extremity edema stable. Patient has been compliant with her medications. Denies any headaches, or visual changes above her baseline macular degeneration and prevents her from driving. Past Medical History: Past Medical History  Diagnosis Date  . Benign neoplasm of colon   . Diverticulosis of colon (without mention of hemorrhage)   . Reflux esophagitis   . Gastric polyp     history of  . GERD (gastroesophageal reflux disease)   . Stricture and stenosis of esophagus   . Reactive airway disease   . Anemia, iron deficiency   . CHF (congestive heart failure)   . Restless leg syndrome   . Hypercholesterolemia   . Nonischemic cardiomyopathy     EF 20%  . Personal history of sudden cardiac death successfully resuscitated     status post cardioverter- defibrillator  . Atrial fibrillation   . Diabetes mellitus   . Osteoarthritis    Past Surgical History  Procedure Date  . Cardiac defibrillator placement 1999, 2001, 3.30.2012    [facility], Removal of previously implanted BiV ICD followed by insertion of BiV pacemaker with pacemaker pocket revision. without immediate procedural complications.   . Eye surgery 2002    cataract   . Total hip arthroplasty 2004    right    Medications: Prior to Admission medications   Medication Sig Start Date End Date Taking? Authorizing Provider  acetaminophen (TYLENOL)  325 MG tablet Take 650 mg by mouth every 6 (six) hours as needed. For pain   Yes Historical Provider, MD  amiodarone (PACERONE) 200 MG tablet Take one tablet by mouth twice daily. 02/14/11  Yes Duke Salvia,  MD  azelastine (OPTIVAR) 0.05 % ophthalmic solution Place 1 drop into both eyes 2 (two) times daily.     Yes Historical Provider, MD  desloratadine (CLARINEX) 5 MG tablet Take 5 mg by mouth daily.     Yes Historical Provider, MD  fish oil-omega-3 fatty acids 1000 MG capsule Take 2 g by mouth daily.     Yes Historical Provider, MD  furosemide (LASIX) 40 MG tablet Take 40-80 mg by mouth 2 (two) times daily. Take 80 mg in the morning and 40 mg in the evening   Yes Historical Provider, MD  irbesartan (AVAPRO) 75 MG tablet Take 75 mg by mouth at bedtime.     Yes Historical Provider, MD  levothyroxine (SYNTHROID, LEVOTHROID) 100 MCG tablet Take 100 mcg by mouth daily.     Yes Historical Provider, MD  montelukast (SINGULAIR) 10 MG tablet Take 10 mg by mouth at bedtime.     Yes Historical Provider, MD  Pancrelipase, Lip-Prot-Amyl, (CREON) 12000 UNITS CPEP Take 1 capsule by mouth 3 (three) times daily before meals.    Yes Historical Provider, MD  Polyethyl Glycol-Propyl Glycol (SYSTANE ULTRA OP) Apply 3 drops to eye 3 (three) times daily.     Yes Historical Provider, MD  spironolactone (ALDACTONE) 25 MG tablet Take 25 mg by mouth daily.     Yes Historical Provider, MD  warfarin (COUMADIN) 4 MG tablet Take 2-4 mg by mouth daily. Take 2 mg except on Thursday take 4 mg   Yes Historical Provider, MD   Question of whether the patient is taking metoprolol succinate 50 mg XR 24 1 by mouth daily in addition to Symbicort 160/4.51 inhalation daily along with her Spiriva which has been ordered. Allergies:   Allergies  Allergen Reactions  . Iodinated Diagnostic Agents     Unknown    Social History:  reports that she has never smoked. She does not have any smokeless tobacco history on file. She reports that she does not  use illicit drugs. Patient admits to drinking 2 drinks per servings of alcohol 4 days each week. She also lives alone with help of a housekeeper, does have a daughter who lives locally and supports  her along with cooking. Uses a walker at baseline.  Family History: No family history on file.  Physical Exam: Filed Vitals:   10/14/11 1610 10/14/11 1812  BP: 149/68 113/61  Pulse: 78 69  Temp: 100 F (37.8 C) 98.4 F (36.9 C)  TempSrc: Oral Oral  Resp: 18 18  SpO2: 94% 96%   Elderly Caucasian female in no apparent distress, answering all questions appropriately, questionable short-term memory deficits Sclera anicteric extraconal movements are intact, face is symmetric No oropharyngeal lesions Neck is supple, no cervical lymphadenopathy, no carotid bruits appreciated Lungs reveal clear auscultation, with no respiratory distress or accessory muscle usage, no rhonchi or wheezing appreciated No axillary lymphadenopathy Cardiovascular reveals regular rate and rhythm with some ectopy, soft heart sounds. Abdomen, soft, nontender, nondistended, no suprapubic tenderness, no hepatomegaly No significant edema felt, pulses are intact, extremities are warm, make patient can move all 4 extremities against gravity, no tremors or significant crepitance or synovitis that is active is present. Neurologic: Alert and oriented X 3, normal strength and tone. Normal symmetric reflexes.  Labs on Admission:   [facility] 10/14/11 1625  NA 137  K 3.3*  CL 98  CO2 22  GLUCOSE 147*  BUN 27*  CREATININE 1.67*  CALCIUM 9.2  MG --  PHOS --    Basename 10/14/11 1625  AST 30  ALT 52*  ALKPHOS 106  BILITOT 1.2  PROT 7.2  ALBUMIN 3.5   No results found for this basename: LIPASE:2,AMYLASE:2 in the last 72 hours  Basename 10/14/11 1625  WBC 17.0*  NEUTROABS 15.4*  HGB 12.5  HCT 36.8  MCV 100.3*  PLT 159   No results found for this basename: CKTOTAL:3,CKMB:3,CKMBINDEX:3,TROPONINI:3 in the last 72 hours No results found for this basename: TSH,T4TOTAL,FREET3,T3FREE,THYROIDAB in the last 72 hours No results found for this basename: VITAMINB12:2,FOLATE:2,FERRITIN:2,TIBC:2,IRON:2,RETICCTPCT:2  in the last 72 hours  Radiological Exams on Admission: Ct Head Wo Contrast  10/14/2011  *RADIOLOGY REPORT*  Clinical Data:  Fall, striking her head on a wall.  The patient is on Coumadin.  Pain in the back of her head.  Neck pain.  No loss of consciousness.  CT HEAD WITHOUT CONTRAST CT CERVICAL SPINE WITHOUT CONTRAST  Technique:  Multidetector CT imaging of the head and cervical spine was performed following the standard protocol without intravenous contrast.  Multiplanar CT image reconstructions of the cervical spine were also generated.  Comparison:  CT head without contrast 03/20/2008.  CT HEAD  Findings: Mild soft tissue swelling and contusion is seen posteriorly near the vertex.  There is no underlying fracture.  The paranasal sinuses and mastoid air cells are clear.  Atherosclerotic calcifications are present within the cavernous carotid arteries bilaterally.  Scattered periventricular subcortical white matter hypoattenuation is stable.  A remote lacunar infarct of the right basal ganglia is again noted.  No acute cortical infarct, hemorrhage, or mass lesion is present. The ventricles are proportionate to the degree of atrophy.  No significant extra-axial fluid collection is present.  IMPRESSION:  1.  Stable atrophy and extensive white matter disease. 2.  No acute intracranial abnormality. 3.  Soft tissue contusion posteriorly near the vertex without underlying fracture. 4.  Atherosclerosis  CT CERVICAL SPINE  Findings: The cervical spine is imaged from the skull base through T1-2.  Mild degenerative anterolisthesis is present at C3-4, C4-5 and C5-6.  No acute fracture or traumatic subluxation is evident. Multilevel facet degenerative changes are evident.  The soft tissues demonstrate atherosclerotic calcifications at the right carotid bifurcation without some definite stenosis.  A left-sided pacemaker is in place.  IMPRESSION:  1.  No acute fracture or traumatic subluxation. 2.  Moderate spondylosis of the  cervical spine.  Original Report Authenticated By: Jamesetta Orleans. MATTERN, M.D.   Ct Cervical Spine Wo Contrast  10/14/2011  *RADIOLOGY REPORT*  Clinical Data:  Fall, striking her head on a wall.  The patient is on Coumadin.  Pain in the back of her head.  Neck pain.  No loss of consciousness.  CT HEAD WITHOUT CONTRAST CT CERVICAL SPINE WITHOUT CONTRAST  Technique:  Multidetector CT imaging of the head and cervical spine was performed following the standard protocol without intravenous contrast.  Multiplanar CT image reconstructions of the cervical spine were also generated.  Comparison:  CT head without contrast 03/20/2008.  CT HEAD  Findings: Mild soft tissue swelling and contusion is seen posteriorly near the vertex.  There is no underlying fracture.  The paranasal sinuses and mastoid air cells are clear.  Atherosclerotic calcifications are present within the cavernous carotid arteries bilaterally.  Scattered periventricular  subcortical white matter hypoattenuation is stable.  A remote lacunar infarct of the right basal ganglia is again noted.  No acute cortical infarct, hemorrhage, or mass lesion is present. The ventricles are proportionate to the degree of atrophy.  No significant extra-axial fluid collection is present.  IMPRESSION:  1.  Stable atrophy and extensive white matter disease. 2.  No acute intracranial abnormality. 3.  Soft tissue contusion posteriorly near the vertex without underlying fracture. 4.  Atherosclerosis  CT CERVICAL SPINE  Findings: The cervical spine is imaged from the skull base through T1-2.  Mild degenerative anterolisthesis is present at C3-4, C4-5 and C5-6.  No acute fracture or traumatic subluxation is evident. Multilevel facet degenerative changes are evident.  The soft tissues demonstrate atherosclerotic calcifications at the right carotid bifurcation without some definite stenosis.  A left-sided pacemaker is in place.  IMPRESSION:  1.  No acute fracture or traumatic  subluxation. 2.  Moderate spondylosis of the cervical spine.  Original Report Authenticated By: Jamesetta Orleans. MATTERN, M.D.   Orders placed during the hospital encounter of 10/14/11  . ED EKG  . ED EKG  . EKG 12-LEAD  . EKG 12-LEAD   EKG reviewed, reveals paced rhythm with some ectopy and PVCs  Assessment/Plan Active Problems: Urinary tract infection-treated empirically with Rocephin, culture ordered in the emergency room, associated with fevers, chills, low-grade fevers, general malaise, anorexia. We'll treat supportively with IV fluids, watching for any evidence of volume overload in the setting of cardiomyopathy. We'll treat and watch for symptom improvement Hypertension fairly well-controlled on current medications continue the same Cardiomyopathy, unknown ejection fraction however per patient report, ejection fraction presumably less than 20%, multiple medications followed by the power cardiology, will need for close monitoring with respect to volume overload Atrial fibrillation, hemodynamically stable, no evidence of volume overload at this time, with good rate control on current medications, will continue the same Acute renal failure? Creatinine 1.6 however creatinine in our office with primary care physician was 1.6 approximately 2 months ago, fairly stable, will continue to monitor on a daily basis, question chronic kidney disease is more the etiology given multiple comorbidities GERD, recently started on proton pump inhibitor, will continue the same Generalized weakness-as a result of multiple comorbidities including UTI, cardiomyopathy, will have physical and occupational therapy work with patient, assess orthostatics, and also consult with social work and I given question of whether the patient can return home in a safe manner per daughter's report and question whether the patient needs short-term skilled nursing facility and a for later assisted living.     Wyeth Hoffer  R 10/14/2011, 8:23 PM

## 2011-10-14 NOTE — ED Notes (Signed)
Rocephin infused.  Daughter remains at the bedside

## 2011-10-14 NOTE — ED Notes (Signed)
I gave the patient a cup of ice water. 

## 2011-10-14 NOTE — ED Notes (Signed)
The pt returned from xray 

## 2011-10-14 NOTE — ED Provider Notes (Signed)
History     CSN: [id number]  Arrival date & time 10/14/11  1605   First MD Initiated Contact with Patient 10/14/11 1610      Chief Complaint  Patient presents with  . Fall    (Consider location/radiation/quality/duration/timing/severity/associated sxs/prior treatment) HPI Pt states she has been feeling generally weak for 2 days with decreased PO intake. She has had urinary frequency and incontinence. Subjective fever and chills today. She lost her balance while walking out of the bathroom and struck her head on a wall. No LOC. She was unable to get up and called EMS. C-Collar placed. Pt c/o mild occipital pain. No neck pain. No focal weakness. No abd pain, N/V/D, SOB, cough, CP.  Past Medical History  Diagnosis Date  . Benign neoplasm of colon   . Diverticulosis of colon (without mention of hemorrhage)   . Reflux esophagitis   . Gastric polyp     history of  . GERD (gastroesophageal reflux disease)   . Stricture and stenosis of esophagus   . Reactive airway disease   . Anemia, iron deficiency   . CHF (congestive heart failure)   . Restless leg syndrome   . Hypercholesterolemia   . Nonischemic cardiomyopathy     EF 20%  . Personal history of sudden cardiac death successfully resuscitated     status post cardioverter- defibrillator  . Atrial fibrillation   . Diabetes mellitus   . Osteoarthritis     Past Surgical History  Procedure Date  . Cardiac defibrillator placement 1999, 2001, 3.30.2012    [facility], Removal of previously implanted BiV ICD followed by insertion of BiV pacemaker with pacemaker pocket revision. without immediate procedural complications.   . Eye surgery 2002    cataract   . Total hip arthroplasty 2004    right    No family history on file.  History  Substance Use Topics  . Smoking status: Never Smoker   . Smokeless tobacco: Not on file  . Alcohol Use: No    OB History    Grav Para Term Preterm Abortions TAB SAB Ect Mult Living               Review of Systems  Constitutional: Positive for fever, chills and fatigue.  HENT: Negative for neck pain and neck stiffness.   Eyes: Negative for visual disturbance.  Respiratory: Negative for cough and shortness of breath.   Cardiovascular: Negative for chest pain and palpitations.  Gastrointestinal: Negative for nausea, vomiting, abdominal pain and diarrhea.  Genitourinary: Positive for urgency and frequency.  Musculoskeletal: Negative for myalgias, back pain and arthralgias.  Skin: Negative for rash and wound.  Neurological: Positive for headaches. Negative for dizziness, syncope, light-headedness and numbness.    Allergies  Iodinated diagnostic agents  Home Medications   Current Outpatient Rx  Name Route Sig Dispense Refill  . ACETAMINOPHEN 325 MG PO TABS Oral Take 650 mg by mouth every 6 (six) hours as needed. For pain    . AMIODARONE HCL 200 MG PO TABS  Take one tablet by mouth twice daily. 180 tablet 3  . AZELASTINE HCL 0.05 % OP SOLN Both Eyes Place 1 drop into both eyes 2 (two) times daily.      . DESLORATADINE 5 MG PO TABS Oral Take 5 mg by mouth daily.      . OMEGA-3 FATTY ACIDS 1000 MG PO CAPS Oral Take 2 g by mouth daily.      . FUROSEMIDE 40 MG PO TABS Oral Take  40-80 mg by mouth 2 (two) times daily. Take 80 mg in the morning and 40 mg in the evening    . IRBESARTAN 75 MG PO TABS Oral Take 75 mg by mouth at bedtime.      Marland Kitchen LEVOTHYROXINE SODIUM 100 MCG PO TABS Oral Take 100 mcg by mouth daily.      Marland Kitchen MONTELUKAST SODIUM 10 MG PO TABS Oral Take 10 mg by mouth at bedtime.      Marland Kitchen PANCRELIPASE (LIP-PROT-AMYL) 12000 UNITS PO CPEP Oral Take 1 capsule by mouth 3 (three) times daily before meals.     Frazier Butt ULTRA OP Ophthalmic Apply 3 drops to eye 3 (three) times daily.      Marland Kitchen SPIRONOLACTONE 25 MG PO TABS Oral Take 25 mg by mouth daily.      . WARFARIN SODIUM 4 MG PO TABS Oral Take 2-4 mg by mouth daily. Take 2 mg except on Thursday take 4 mg      BP 113/61   Pulse 69  Temp 98.4 F (36.9 C) (Oral)  Resp 18  SpO2 96%  Physical Exam  Nursing note and vitals reviewed. Constitutional: She is oriented to person, place, and time. She appears well-developed and well-nourished. No distress.  HENT:  Head: Normocephalic.  Mouth/Throat: Oropharynx is clear and moist.       Small hematoma to occiput    Eyes: EOM are normal. Pupils are equal, round, and reactive to light.  Neck:       c-collar in place  Cardiovascular: Normal rate and regular rhythm.   Pulmonary/Chest: Effort normal and breath sounds normal. No respiratory distress. She has no wheezes. She has no rales.  Abdominal: Soft. Bowel sounds are normal. There is no tenderness. There is no rebound and no guarding.  Musculoskeletal: Normal range of motion. She exhibits no edema and no tenderness.  Neurological: She is alert and oriented to person, place, and time.       5/5 motor, sensation intact  Skin: Skin is warm and dry. No rash noted. No erythema.  Psychiatric: She has a normal mood and affect. Her behavior is normal.    ED Course  Procedures (including critical care time)  Labs Reviewed  CBC WITH DIFFERENTIAL - Abnormal; Notable for the following:    WBC 17.0 (*)     RBC 3.67 (*)     MCV 100.3 (*)     MCH 34.1 (*)     Neutrophils Relative 91 (*)     Neutro Abs 15.4 (*)     Lymphocytes Relative 2 (*)     Lymphs Abs 0.4 (*)     Monocytes Absolute 1.2 (*)     All other components within normal limits  COMPREHENSIVE METABOLIC PANEL - Abnormal; Notable for the following:    Potassium 3.3 (*)     Glucose, Bld 147 (*)     BUN 27 (*)     Creatinine, Ser 1.67 (*)     ALT 52 (*)     GFR calc non Af Amer 27 (*)     GFR calc Af Amer 32 (*)     All other components within normal limits  URINALYSIS, ROUTINE W REFLEX MICROSCOPIC - Abnormal; Notable for the following:    Color, Urine AMBER (*)  BIOCHEMICALS MAY BE AFFECTED BY COLOR   APPearance CLOUDY (*)     Hgb urine dipstick LARGE  (*)     Bilirubin Urine SMALL (*)     Ketones, ur 15 (*)  Protein, ur 100 (*)     Leukocytes, UA MODERATE (*)     All other components within normal limits  PROTIME-INR - Abnormal; Notable for the following:    Prothrombin Time 19.5 (*)     INR 1.62 (*)     All other components within normal limits  APTT - Abnormal; Notable for the following:    aPTT 47 (*)     All other components within normal limits  URINE MICROSCOPIC-ADD ON - Abnormal; Notable for the following:    Bacteria, UA MANY (*)     All other components within normal limits  POCT I-STAT TROPONIN I  URINE CULTURE   Ct Head Wo Contrast  10/14/2011  *RADIOLOGY REPORT*  Clinical Data:  Fall, striking her head on a wall.  The patient is on Coumadin.  Pain in the back of her head.  Neck pain.  No loss of consciousness.  CT HEAD WITHOUT CONTRAST CT CERVICAL SPINE WITHOUT CONTRAST  Technique:  Multidetector CT imaging of the head and cervical spine was performed following the standard protocol without intravenous contrast.  Multiplanar CT image reconstructions of the cervical spine were also generated.  Comparison:  CT head without contrast 03/20/2008.  CT HEAD  Findings: Mild soft tissue swelling and contusion is seen posteriorly near the vertex.  There is no underlying fracture.  The paranasal sinuses and mastoid air cells are clear.  Atherosclerotic calcifications are present within the cavernous carotid arteries bilaterally.  Scattered periventricular subcortical white matter hypoattenuation is stable.  A remote lacunar infarct of the right basal ganglia is again noted.  No acute cortical infarct, hemorrhage, or mass lesion is present. The ventricles are proportionate to the degree of atrophy.  No significant extra-axial fluid collection is present.  IMPRESSION:  1.  Stable atrophy and extensive white matter disease. 2.  No acute intracranial abnormality. 3.  Soft tissue contusion posteriorly near the vertex without underlying fracture.  4.  Atherosclerosis  CT CERVICAL SPINE  Findings: The cervical spine is imaged from the skull base through T1-2.  Mild degenerative anterolisthesis is present at C3-4, C4-5 and C5-6.  No acute fracture or traumatic subluxation is evident. Multilevel facet degenerative changes are evident.  The soft tissues demonstrate atherosclerotic calcifications at the right carotid bifurcation without some definite stenosis.  A left-sided pacemaker is in place.  IMPRESSION:  1.  No acute fracture or traumatic subluxation. 2.  Moderate spondylosis of the cervical spine.  Original Report Authenticated By: Jamesetta Orleans. MATTERN, M.D.   Ct Cervical Spine Wo Contrast  10/14/2011  *RADIOLOGY REPORT*  Clinical Data:  Fall, striking her head on a wall.  The patient is on Coumadin.  Pain in the back of her head.  Neck pain.  No loss of consciousness.  CT HEAD WITHOUT CONTRAST CT CERVICAL SPINE WITHOUT CONTRAST  Technique:  Multidetector CT imaging of the head and cervical spine was performed following the standard protocol without intravenous contrast.  Multiplanar CT image reconstructions of the cervical spine were also generated.  Comparison:  CT head without contrast 03/20/2008.  CT HEAD  Findings: Mild soft tissue swelling and contusion is seen posteriorly near the vertex.  There is no underlying fracture.  The paranasal sinuses and mastoid air cells are clear.  Atherosclerotic calcifications are present within the cavernous carotid arteries bilaterally.  Scattered periventricular subcortical white matter hypoattenuation is stable.  A remote lacunar infarct of the right basal ganglia is again noted.  No acute cortical infarct, hemorrhage, or mass  lesion is present. The ventricles are proportionate to the degree of atrophy.  No significant extra-axial fluid collection is present.  IMPRESSION:  1.  Stable atrophy and extensive white matter disease. 2.  No acute intracranial abnormality. 3.  Soft tissue contusion posteriorly near the  vertex without underlying fracture. 4.  Atherosclerosis  CT CERVICAL SPINE  Findings: The cervical spine is imaged from the skull base through T1-2.  Mild degenerative anterolisthesis is present at C3-4, C4-5 and C5-6.  No acute fracture or traumatic subluxation is evident. Multilevel facet degenerative changes are evident.  The soft tissues demonstrate atherosclerotic calcifications at the right carotid bifurcation without some definite stenosis.  A left-sided pacemaker is in place.  IMPRESSION:  1.  No acute fracture or traumatic subluxation. 2.  Moderate spondylosis of the cervical spine.  Original Report Authenticated By: Jamesetta Orleans. MATTERN, M.D.     1. UTI (lower urinary tract infection)   2. Generalized weakness   3. Dehydration      Date: 10/14/2011  Rate: 76  Rhythm: normal sinus rhythm  QRS Axis: indeterminate  Intervals: QRS prolonged  ST/T Wave abnormalities: indeterminate  Conduction Disutrbances:nonspecific intraventricular conduction delay  Narrative Interpretation:   Old EKG Reviewed: unchanged Pt appears to have AV paced rhythm with intermittent PVC's similar to previous EKG   MDM   Pt states she still feels very weak and doesn't think she can go home. Will discuss admission with [facility].   Dr Katherine Basset will see in ED and admit     Loren Racer, MD 10/14/11 [phone]

## 2011-10-14 NOTE — ED Notes (Signed)
The pt has been feeling weak with urinary incontinence for 2-3 days.  She is also c/o bi-lateral shoulder  Back of her head and more back pain below the waist.  Spine board removed c-collar remains in place

## 2011-10-14 NOTE — ED Notes (Signed)
 nss bolus infused.  Iv continued at 162ml/hr

## 2011-10-14 NOTE — ED Notes (Signed)
REPORT GIVEN TO ANGIE RN AT 4700 FLOOR .

## 2011-10-15 LAB — GLUCOSE, CAPILLARY
Glucose-Capillary: 121 mg/dL — ABNORMAL HIGH (ref 70–99)
Glucose-Capillary: 169 mg/dL — ABNORMAL HIGH (ref 70–99)
Glucose-Capillary: 100 mg/dL — ABNORMAL HIGH (ref 70–99)

## 2011-10-15 LAB — CBC
MCH: 34.5 pg — ABNORMAL HIGH (ref 26.0–34.0)
MCHC: 33.9 g/dL (ref 30.0–36.0)
Platelets: 148 10*3/uL — ABNORMAL LOW (ref 150–400)
RBC: 3.62 MIL/uL — ABNORMAL LOW (ref 3.87–5.11)

## 2011-10-15 LAB — COMPREHENSIVE METABOLIC PANEL
ALT: 50 U/L — ABNORMAL HIGH (ref 0–35)
AST: 30 U/L (ref 0–37)
CO2: 25 mEq/L (ref 19–32)
Chloride: 99 mEq/L (ref 96–112)
Creatinine, Ser: 1.64 mg/dL — ABNORMAL HIGH (ref 0.50–1.10)
GFR calc non Af Amer: 28 mL/min — ABNORMAL LOW (ref >90)
Glucose, Bld: 102 mg/dL — ABNORMAL HIGH (ref 70–99)
Total Bilirubin: 0.9 mg/dL (ref 0.3–1.2)

## 2011-10-15 LAB — PROTIME-INR: Prothrombin Time: 17.2 seconds — ABNORMAL HIGH (ref 11.6–15.2)

## 2011-10-15 MED ORDER — POTASSIUM CHLORIDE CRYS ER 20 MEQ PO TBCR
20.0000 meq | EXTENDED_RELEASE_TABLET | Freq: Two times a day (BID) | ORAL | Status: AC
  Administered 2011-10-15 (×2): 20 meq via ORAL

## 2011-10-15 MED ORDER — WARFARIN SODIUM 5 MG PO TABS
5.0000 mg | ORAL_TABLET | Freq: Once | ORAL | Status: AC
  Administered 2011-10-15: 5 mg via ORAL
  Filled 2011-10-15: qty 1

## 2011-10-15 MED ORDER — ENOXAPARIN SODIUM 30 MG/0.3ML ~~LOC~~ SOLN
30.0000 mg | SUBCUTANEOUS | Status: DC
  Administered 2011-10-15 – 2011-10-16 (×2): 30 mg via SUBCUTANEOUS
  Filled 2011-10-15 (×3): qty 0.3

## 2011-10-15 NOTE — Progress Notes (Signed)
Physical Therapy Evaluation Patient Details Name: [name] MRN: [id number] DOB: Nov 13, 1926 Today's Date: 10/15/2011 Time: 5361-4431 PT Time Calculation (min): 19 min  PT Assessment / Plan / Recommendation Clinical Impression  78 yo female admitted with weakness, falls found to have UTI presents with decr functional mobility;   Will benefit from acute PT to maximize independence and safety with mobility to eventually meet pt's stated goal of going home;   At this point, recommend SNF for rehab to maximize functional mobility as pt must be completely modified independent to be able to dc home;   If quick progress, will condifer home with HHPT    PT Assessment  Patient needs continued PT services    Follow Up Recommendations  Skilled nursing facility    Barriers to Discharge Decreased caregiver support Must be modified independent to dc home alone    Equipment Recommendations  Defer to next venue    Recommendations for Other Services OT consult   Frequency Min 3X/week    Precautions / Restrictions Precautions Precautions: Fall Restrictions Weight Bearing Restrictions: No   Pertinent Vitals/Pain Pt with some RLE pain with mobility; Did not rate; possible pain due to fall (pt states she fell on right side); Repositioned, RN made aware      Mobility  Bed Mobility Bed Mobility: Rolling Left;Left Sidelying to Sit;Sitting - Scoot to Edge of Bed Rolling Left: 4: Min assist;With rail Left Sidelying to Sit: 4: Min assist;With rails Sitting - Scoot to Edge of Bed: 4: Min guard;With rail Details for Bed Mobility Assistance: cues for technique and safety Transfers Transfers: Sit to Stand;Stand to Sit Sit to Stand: 4: Min assist;With upper extremity assist;From bed Stand to Sit: 4: Min assist;With upper extremity assist;To chair/3-in-1 Details for Transfer Assistance: Cues for technique, safety, and self-monitor for activity tol Ambulation/Gait Ambulation/Gait Assistance: 4:  Min assist Ambulation Distance (Feet): 3 Feet (bed to chair) Assistive device: 1 person hand held assist Ambulation/Gait Assistance Details: Took 2 pivot steps to recliner; Pt politely declined further ambulation this session; states is fatigued    Exercises General Exercises - Lower Extremity Ankle Circles/Pumps: AROM;Both;10 reps;Supine   PT Diagnosis: Difficulty walking;Generalized weakness  PT Problem List: Decreased strength;Decreased activity tolerance;Decreased balance;Decreased mobility;Decreased knowledge of use of DME;Pain PT Treatment Interventions: DME instruction;Gait training;Stair training;Functional mobility training;Therapeutic activities;Therapeutic exercise;Balance training;Patient/family education   PT Goals Acute Rehab PT Goals PT Goal Formulation: With patient Time For Goal Achievement: 10/29/11 Potential to Achieve Goals: Good Pt will go Supine/Side to Sit: with modified independence PT Goal: Supine/Side to Sit - Progress: Goal set today Pt will go Sit to Supine/Side: with modified independence PT Goal: Sit to Supine/Side - Progress: Goal set today Pt will go Sit to Stand: with modified independence PT Goal: Sit to Stand - Progress: Goal set today Pt will go Stand to Sit: with modified independence PT Goal: Stand to Sit - Progress: Goal set today Pt will Ambulate: >150 feet;with modified independence;with rolling walker PT Goal: Ambulate - Progress: Goal set today Pt will Go Up / Down Stairs: 1-2 stairs;with modified independence;with rolling walker PT Goal: Up/Down Stairs - Progress: Goal set today  Visit Information  Last PT Received On: 10/15/11 Assistance Needed: +1    Subjective Data  Subjective: Nervous about falling, but agreeable to OOB Patient Stated Goal: Get home   Prior Functioning  Home Living Lives With: Alone Available Help at Discharge: Family;Friend(s);Other (Comment) (Housekeeper 3 days/week, helps with home management, drives pt to  store and MD  appts) Type of Home: Other (Comment) ([employer]) Home Access: Stairs to enter [employer] of Steps: 1 Entrance Stairs-Rails: None Home Layout: One level Bathroom Shower/Tub:  (to be determined) Home Adaptive Equipment: Walker - rolling Prior Function Level of Independence: Independent with assistive device(s) Able to Take Stairs?: Yes (single step to enter home) Driving: No Communication Communication: No difficulties Dominant Hand: Right    Cognition  Overall Cognitive Status: Appears within functional limits for tasks assessed/performed Arousal/Alertness: Awake/alert Orientation Level: Appears intact for tasks assessed Behavior During Session: [facility] for tasks performed    Extremity/Trunk Assessment Right Upper Extremity Assessment RUE ROM/Strength/Tone: [facility] for tasks assessed Left Upper Extremity Assessment LUE ROM/Strength/Tone: WFL for tasks assessed Right Lower Extremity Assessment RLE ROM/Strength/Tone: Deficits RLE ROM/Strength/Tone Deficits: Generalized weakness, with dependence on UE support/push for successful sit to stand Left Lower Extremity Assessment LLE ROM/Strength/Tone: Deficits LLE ROM/Strength/Tone Deficits: Generalized weakness, with dependence on UE support/push for successful sit to stand   Balance    End of Session PT - End of Session Equipment Utilized During Treatment: Gait belt Activity Tolerance: Patient tolerated treatment well;Patient limited by fatigue Patient left: in chair;with call bell/phone within reach Nurse Communication: Mobility status  GP     Van Clines Hamff 10/15/2011, 9:15 AM

## 2011-10-15 NOTE — Progress Notes (Signed)
Subjective: Patient still feels very weak this morning, did work with physical therapy, and they did recommend short-term skilled nursing facility placement versus home health PT if patient remains a remarkable recovery. Patient denies any chest pain, palpitations, shortness of breath above baseline. Denies any nausea, vomiting, daily breakfast. Denies any overt dysuria, fevers or chills at this point unlike her presenting symptoms.  Objective: Vital signs in last 24 hours: Temp:  [98 F (36.7 C)-100 F (37.8 C)] 98 F (36.7 C) (08/04 0933) Pulse Rate:  [51-78] 51  (08/04 0933) Resp:  [18-21] 20  (08/04 0933) BP: (95-149)/(52-109) 131/109 mmHg (08/04 0933) SpO2:  [94 %-100 %] 95 % (08/04 0933) Weight:  [82.2 kg (181 lb 3.5 oz)-82.4 kg (181 lb 10.5 oz)] 82.2 kg (181 lb 3.5 oz) (08/04 0531) Weight change:  Last BM Date: 10/12/11  CBG (last 3)   Basename 10/15/11 0615 10/14/11 2101  GLUCAP 103* 108*    Intake/Output from previous day: 08/03 0701 - 08/04 0700 In: -  Out: 200 [Urine:200] Intake/Output this shift: Total I/O In: 240 [P.O.:240] Out: -   Elderly but appropriate, answering all questions appropriate, alert and oriented x3 No oropharyngeal lesions Neck supple, no cervical lymphadenopathy Lungs clear to auscultation bilaterally Cardiovascular exam reveals a regular rate with history of A. fib Abdominal exam reveals a soft nontender nondistended abdomen bowel sounds present, no suprapubic tenderness No edema, pedal pulses are intact but diminished, no cyanosis or active synovitis  Hematoma on the posterior left scalp resolving, no fluctuance Alert and oriented x3, Nourse normal strength and tone  Lab Results:  Basename 10/15/11 0500 10/14/11 1625  NA 137 137  K 3.3* 3.3*  CL 99 98  CO2 25 22  GLUCOSE 102* 147*  BUN 29* 27*  CREATININE 1.64* 1.67*  CALCIUM 9.2 9.2  MG -- --  PHOS -- --    Basename 10/15/11 0500 10/14/11 1625  AST 30 30  ALT 50* 52*    ALKPHOS 108 106  BILITOT 0.9 1.2  PROT 7.0 7.2  ALBUMIN 3.3* 3.5    Basename 10/15/11 0500 10/14/11 1625  WBC 13.7* 17.0*  NEUTROABS -- 15.4*  HGB 12.5 12.5  HCT 36.9 36.8  MCV 101.9* 100.3*  PLT 148* 159   No results found for this basename: CKTOTAL:3,CKMB:3,CKMBINDEX:3,TROPONINI:3 in the last 72 hours No results found for this basename: TSH,T4TOTAL,FREET3,T3FREE,THYROIDAB in the last 72 hours No results found for this basename: VITAMINB12:2,FOLATE:2,FERRITIN:2,TIBC:2,IRON:2,RETICCTPCT:2 in the last 72 hours  Studies/Results: Ct Head Wo Contrast  10/14/2011  *RADIOLOGY REPORT*  Clinical Data:  Fall, striking her head on a wall.  The patient is on Coumadin.  Pain in the back of her head.  Neck pain.  No loss of consciousness.  CT HEAD WITHOUT CONTRAST CT CERVICAL SPINE WITHOUT CONTRAST  Technique:  Multidetector CT imaging of the head and cervical spine was performed following the standard protocol without intravenous contrast.  Multiplanar CT image reconstructions of the cervical spine were also generated.  Comparison:  CT head without contrast 03/20/2008.  CT HEAD  Findings: Mild soft tissue swelling and contusion is seen posteriorly near the vertex.  There is no underlying fracture.  The paranasal sinuses and mastoid air cells are clear.  Atherosclerotic calcifications are present within the cavernous carotid arteries bilaterally.  Scattered periventricular subcortical white matter hypoattenuation is stable.  A remote lacunar infarct of the right basal ganglia is again noted.  No acute cortical infarct, hemorrhage, or mass lesion is present. The ventricles are proportionate to  the degree of atrophy.  No significant extra-axial fluid collection is present.  IMPRESSION:  1.  Stable atrophy and extensive white matter disease. 2.  No acute intracranial abnormality. 3.  Soft tissue contusion posteriorly near the vertex without underlying fracture. 4.  Atherosclerosis  CT CERVICAL SPINE  Findings:  The cervical spine is imaged from the skull base through T1-2.  Mild degenerative anterolisthesis is present at C3-4, C4-5 and C5-6.  No acute fracture or traumatic subluxation is evident. Multilevel facet degenerative changes are evident.  The soft tissues demonstrate atherosclerotic calcifications at the right carotid bifurcation without some definite stenosis.  A left-sided pacemaker is in place.  IMPRESSION:  1.  No acute fracture or traumatic subluxation. 2.  Moderate spondylosis of the cervical spine.  Original Report Authenticated By: Jamesetta Orleans. MATTERN, M.D.   Ct Cervical Spine Wo Contrast  10/14/2011  *RADIOLOGY REPORT*  Clinical Data:  Fall, striking her head on a wall.  The patient is on Coumadin.  Pain in the back of her head.  Neck pain.  No loss of consciousness.  CT HEAD WITHOUT CONTRAST CT CERVICAL SPINE WITHOUT CONTRAST  Technique:  Multidetector CT imaging of the head and cervical spine was performed following the standard protocol without intravenous contrast.  Multiplanar CT image reconstructions of the cervical spine were also generated.  Comparison:  CT head without contrast 03/20/2008.  CT HEAD  Findings: Mild soft tissue swelling and contusion is seen posteriorly near the vertex.  There is no underlying fracture.  The paranasal sinuses and mastoid air cells are clear.  Atherosclerotic calcifications are present within the cavernous carotid arteries bilaterally.  Scattered periventricular subcortical white matter hypoattenuation is stable.  A remote lacunar infarct of the right basal ganglia is again noted.  No acute cortical infarct, hemorrhage, or mass lesion is present. The ventricles are proportionate to the degree of atrophy.  No significant extra-axial fluid collection is present.  IMPRESSION:  1.  Stable atrophy and extensive white matter disease. 2.  No acute intracranial abnormality. 3.  Soft tissue contusion posteriorly near the vertex without underlying fracture. 4.   Atherosclerosis  CT CERVICAL SPINE  Findings: The cervical spine is imaged from the skull base through T1-2.  Mild degenerative anterolisthesis is present at C3-4, C4-5 and C5-6.  No acute fracture or traumatic subluxation is evident. Multilevel facet degenerative changes are evident.  The soft tissues demonstrate atherosclerotic calcifications at the right carotid bifurcation without some definite stenosis.  A left-sided pacemaker is in place.  IMPRESSION:  1.  No acute fracture or traumatic subluxation. 2.  Moderate spondylosis of the cervical spine.  Original Report Authenticated By: Jamesetta Orleans. MATTERN, M.D.     Medications: Scheduled:   . amiodarone  200 mg Oral BID  . cefTRIAXone (ROCEPHIN)  IV  1 g Intravenous Once  . cefTRIAXone (ROCEPHIN)  IV  1 g Intravenous Q24H  . furosemide  40 mg Oral q1800  . furosemide  80 mg Oral Q breakfast  . irbesartan  75 mg Oral QHS  . levothyroxine  100 mcg Oral Q0600  . lipase/protease/amylase  1 capsule Oral TID AC  . loratadine  10 mg Oral Daily  . montelukast  10 mg Oral QHS  . olopatadine  1 drop Both Eyes BID  . omega-3 acid ethyl esters  2 g Oral Daily  . pantoprazole  40 mg Oral Q0600  . polyvinyl alcohol  3 drop Both Eyes TID  . sodium chloride  500 mL Intravenous Once  .  sodium chloride  3 mL Intravenous Q12H  . spironolactone  25 mg Oral Daily  . tiotropium  18 mcg Inhalation Daily  . warfarin  4 mg Oral NOW  . Warfarin - Pharmacist Dosing Inpatient   Does not apply q1800  . DISCONTD: furosemide  40-80 mg Oral BID   Continuous:   . 0.45 % NaCl with KCl 20 mEq / L 50 mL/hr at 10/15/11 0005    Assessment/Plan: Active Problems: Urinary tract infection, to placed on empiric Rocephin, culture and sensitivity pending, low-grade fevers and chills have resolved. Continues to be weak. Anorexia possibly improving Cardiomyopathy, ejection fraction thought to be around 20%, multiple medications continued, no evidence of volume overload  despite the use of small amount of IV fluids, hemodynamically stable Hypertension stable on current medications with good rate control Atrial fibrillation hemodynamically stable no evidence of volume overload, good rate control and current medications Anticoagulation, subtherapeutic, being adjusted by pharmacy, PT/INR 1.38, will give subcutaneous Lovenox prophylactically until therapeutic. GERD will place on proton pump inhibitor Generalized weakness, multifactorial secondary to cardiomyopathy, urinary tract infection-physical therapy and occupational therapy underway, question placement to skilled nursing facility on a temporary basis given that patient lives independently. Question the need for long-term independent or assisted living situation Hypokalemia we'll replete   LOS: 1 day   Kevron Patella R 10/15/2011, 10:58 AM

## 2011-10-15 NOTE — Progress Notes (Addendum)
ANTICOAGULATION CONSULT NOTE - Follow up Consult  Pharmacy Consult for Coumadin Indication: atrial fibrillation  Allergies  Allergen Reactions  . Iodinated Diagnostic Agents     Unknown    Patient Measurements: Height: 5\' 4"  (162.6 cm) Weight: 181 lb 3.5 oz (82.2 kg) (bed) IBW/kg (Calculated) : 54.7   Vital Signs: Temp: 98 F (36.7 C) (08/04 0933) Temp src: Oral (08/04 0933) BP: 131/109 mmHg (08/04 0933) Pulse Rate: 51  (08/04 0933)  Labs:  Basename 10/15/11 0500 10/14/11 1625  HGB 12.5 12.5  HCT 36.9 36.8  PLT 148* 159  APTT -- 47*  LABPROT 17.2* 19.5*  INR 1.38 1.62*  HEPARINUNFRC -- --  CREATININE 1.64* 1.67*  CKTOTAL -- --  CKMB -- --  TROPONINI -- --    Estimated Creatinine Clearance: 27 ml/min (by C-G formula based on Cr of 1.64).   Medical History: Past Medical History  Diagnosis Date  . Benign neoplasm of colon   . Diverticulosis of colon (without mention of hemorrhage)   . Reflux esophagitis   . Gastric polyp     history of  . GERD (gastroesophageal reflux disease)   . Stricture and stenosis of esophagus   . Reactive airway disease   . Anemia, iron deficiency   . CHF (congestive heart failure)   . Restless leg syndrome   . Hypercholesterolemia   . Nonischemic cardiomyopathy     EF 20%  . Personal history of sudden cardiac death successfully resuscitated     status post cardioverter- defibrillator  . Atrial fibrillation   . Diabetes mellitus   . Osteoarthritis   . ICD (implantable cardiac defibrillator) in place     Medications:  Prescriptions prior to admission  Medication Sig Dispense Refill  . acetaminophen (TYLENOL) 325 MG tablet Take 650 mg by mouth every 6 (six) hours as needed. For pain      . amiodarone (PACERONE) 200 MG tablet Take one tablet by mouth twice daily.  180 tablet  3  . azelastine (OPTIVAR) 0.05 % ophthalmic solution Place 1 drop into both eyes 2 (two) times daily.        Marland Kitchen desloratadine (CLARINEX) 5 MG tablet Take  5 mg by mouth daily.        . fish oil-omega-3 fatty acids 1000 MG capsule Take 2 g by mouth daily.        . furosemide (LASIX) 40 MG tablet Take 40-80 mg by mouth 2 (two) times daily. Take 80 mg in the morning and 40 mg in the evening      . irbesartan (AVAPRO) 75 MG tablet Take 75 mg by mouth at bedtime.        Marland Kitchen levothyroxine (SYNTHROID, LEVOTHROID) 100 MCG tablet Take 100 mcg by mouth daily.        . montelukast (SINGULAIR) 10 MG tablet Take 10 mg by mouth at bedtime.        . Pancrelipase, Lip-Prot-Amyl, (CREON) 12000 UNITS CPEP Take 1 capsule by mouth 3 (three) times daily before meals.       Bertram Gala Glycol-Propyl Glycol (SYSTANE ULTRA OP) Apply 3 drops to eye 3 (three) times daily.        Marland Kitchen spironolactone (ALDACTONE) 25 MG tablet Take 25 mg by mouth daily.        Marland Kitchen warfarin (COUMADIN) 4 MG tablet Take 2-4 mg by mouth daily. Take 2 mg except on Thursday take 4 mg       Scheduled:     . amiodarone  200 mg Oral BID  . cefTRIAXone (ROCEPHIN)  IV  1 g Intravenous Once  . cefTRIAXone (ROCEPHIN)  IV  1 g Intravenous Q24H  . furosemide  40 mg Oral q1800  . furosemide  80 mg Oral Q breakfast  . irbesartan  75 mg Oral QHS  . levothyroxine  100 mcg Oral Q0600  . lipase/protease/amylase  1 capsule Oral TID AC  . loratadine  10 mg Oral Daily  . montelukast  10 mg Oral QHS  . olopatadine  1 drop Both Eyes BID  . omega-3 acid ethyl esters  2 g Oral Daily  . pantoprazole  40 mg Oral Q0600  . polyvinyl alcohol  3 drop Both Eyes TID  . potassium chloride  20 mEq Oral BID  . sodium chloride  500 mL Intravenous Once  . sodium chloride  3 mL Intravenous Q12H  . spironolactone  25 mg Oral Daily  . tiotropium  18 mcg Inhalation Daily  . warfarin  4 mg Oral NOW  . Warfarin - Pharmacist Dosing Inpatient   Does not apply q1800  . DISCONTD: furosemide  40-80 mg Oral BID    Assessment: 78yo female admitted s/p fall due to generalized weakness, found with UTI and hypotension, to continue  Coumadin for Afib; admitted with subtherapeutic INR; CT negative for bleed.  Goal of Therapy:  INR 2-3   Plan:  Will give boosted Coumadin dose of 5mg  x1 today and monitor INR for dose adjustments.  Woodfin Ganja PharmD 10/15/2011,12:13 PM  Addum:  Add lovenox while INR <2.0.  Will give 30 mg sq daily as CrCl<30 ml/min.  F/u CBC.

## 2011-10-16 LAB — COMPREHENSIVE METABOLIC PANEL
AST: 51 U/L — ABNORMAL HIGH (ref 0–37)
Albumin: 3 g/dL — ABNORMAL LOW (ref 3.5–5.2)
Alkaline Phosphatase: 99 U/L (ref 39–117)
BUN: 35 mg/dL — ABNORMAL HIGH (ref 6–23)
Creatinine, Ser: 1.68 mg/dL — ABNORMAL HIGH (ref 0.50–1.10)
Potassium: 3.5 mEq/L (ref 3.5–5.1)
Total Protein: 6.6 g/dL (ref 6.0–8.3)

## 2011-10-16 LAB — GLUCOSE, CAPILLARY: Glucose-Capillary: 144 mg/dL — ABNORMAL HIGH (ref 70–99)

## 2011-10-16 LAB — CBC
HCT: 35.7 % — ABNORMAL LOW (ref 36.0–46.0)
MCH: 34.3 pg — ABNORMAL HIGH (ref 26.0–34.0)
MCV: 100.3 fL — ABNORMAL HIGH (ref 78.0–100.0)
RBC: 3.56 MIL/uL — ABNORMAL LOW (ref 3.87–5.11)
WBC: 9.7 10*3/uL (ref 4.0–10.5)

## 2011-10-16 MED ORDER — WARFARIN SODIUM 4 MG PO TABS
4.0000 mg | ORAL_TABLET | Freq: Once | ORAL | Status: AC
  Administered 2011-10-16: 4 mg via ORAL
  Filled 2011-10-16: qty 1

## 2011-10-16 MED ORDER — CEPHALEXIN 500 MG PO CAPS
500.0000 mg | ORAL_CAPSULE | Freq: Three times a day (TID) | ORAL | Status: DC
  Administered 2011-10-16 – 2011-10-17 (×3): 500 mg via ORAL
  Filled 2011-10-16 (×9): qty 1

## 2011-10-16 NOTE — Clinical Social Work Placement (Addendum)
Clinical Social Work Department CLINICAL SOCIAL WORK PLACEMENT NOTE 10/16/2011  Patient:  [name], [name]  Account Number:  [id number] Admit date:  10/14/2011  Clinical Social Worker:  Thor Nannini Lubertha Basque  Date/time:  10/16/2011 10:50 AM  Clinical Social Work is seeking post-discharge placement for this patient at the following level of care:   SKILLED NURSING   (*CSW will update this form in Epic as items are completed)   10/16/2011  Patient/family provided with [facility] Department of Clinical Social Work's list of facilities offering this level of care within the geographic area requested by the patient (or if unable, by the patient's family).  10/16/2011  Patient/family informed of their freedom to choose among providers that offer the needed level of care, that participate in Medicare, Medicaid or managed care program needed by the patient, have an available bed and are willing to accept the patient.  10/16/2011  Patient/family informed of [facility]' ownership interest in [facility], as well as of the fact that they are under no obligation to receive care at this facility.  PASARR submitted to EDS on 10/16/2011 PASARR number received from [employer] on 10/16/2011  FL2 transmitted to all facilities in geographic area requested by pt/family on  10/16/2011 FL2 transmitted to all facilities within larger geographic area on   Patient informed that his/her managed care company has contracts with or will negotiate with  certain facilities, including the following:     Patient/family informed of bed offers received:  10/17/11  Patient chooses bed at  [facility]. Physician recommends and patient chooses bed at    [facility].  Patient to be transferred to  on  10/17/11 Patient to be transferred to facility by Private Vehicle   The following physician request were entered in Epic:   Additional Comments:  Sabino Niemann, MSW, [employer] [phone]

## 2011-10-16 NOTE — Clinical Social Work Psychosocial (Signed)
Clinical Social Work Department BRIEF PSYCHOSOCIAL ASSESSMENT 10/16/2011  Patient:  [name], [name]     Account Number:  [id number]     Admit date:  10/14/2011  Clinical Social Worker:  Juliette Mangle  Date/Time:  10/16/2011 10:50 AM  Referred by:  Physician  Date Referred:  10/14/2011 Referred for  SNF Placement   Other Referral:   Interview type:  Patient Other interview type:   Daughter was present    PSYCHOSOCIAL DATA Living Status:  ALONE Admitted from facility:   Level of care:   Primary support name:  Wieland,Jacqueline Primary support relationship to patient:  CHILD, ADULT Degree of support available:   Strong    CURRENT CONCERNS Current Concerns  Post-Acute Placement   Other Concerns:    SOCIAL WORK ASSESSMENT / PLAN CSW met with patient and patient's daughter at bedside. CSW introduced self, explained role and discussed SNF referral. Patient reported that the MD spoke to them about SNF placement due to her increased weakness over the past couple of months.  Patient and patient's daughter are both agreeable to SNF placement for rehab and reported that [facility] is their first choice. CSW will begin paperwork for SNF placement and will continue to follow and assist with all d/c needs.   Assessment/plan status:  Psychosocial Support/Ongoing Assessment of Needs Other assessment/ plan:   Information/referral to community resources:   CSW provided SNF choice list    PATIENT'S/FAMILY'S RESPONSE TO PLAN OF CARE: Patient and patient's daughter were receptive and appreciative of information and resources provided by CSW. CSW will continue to follow.    Sabino Niemann, MSW, [employer] [phone]

## 2011-10-16 NOTE — Progress Notes (Signed)
ANTICOAGULATION CONSULT NOTE - Follow up Consult  Pharmacy Consult for Coumadin Indication: atrial fibrillation  Allergies  Allergen Reactions  . Iodinated Diagnostic Agents     Unknown    Patient Measurements: Height: 5\' 4"  (162.6 cm) Weight: 182 lb 5.1 oz (82.7 kg) IBW/kg (Calculated) : 54.7   Vital Signs: Temp: 98.4 F (36.9 C) (08/05 0531) Temp src: Oral (08/05 0531) BP: 116/60 mmHg (08/05 0531) Pulse Rate: 70  (08/05 0531)  Labs:  Basename 10/16/11 0616 10/15/11 0500 10/14/11 1625  HGB 12.2 12.5 --  HCT 35.7* 36.9 36.8  PLT 144* 148* 159  APTT -- -- 47*  LABPROT 21.4* 17.2* 19.5*  INR 1.82* 1.38 1.62*  HEPARINUNFRC -- -- --  CREATININE 1.68* 1.64* 1.67*  CKTOTAL -- -- --  CKMB -- -- --  TROPONINI -- -- --    Estimated Creatinine Clearance: 26.4 ml/min (by C-G formula based on Cr of 1.68).  Assessment: 78yo female admitted s/p fall due to generalized weakness, found with UTI and hypotension, to continue Coumadin for Afib; admitted with subtherapeutic INR; CT negative for bleed.  Today INR remains slightly subtherapeutic at 1.82 but is starting to increase appropriately. No bleeding noted. H/H are stable, platelets are slightly low not dramatic drop. Pt continues on lovenox 30mg  daily until INR is therapeutic.   Goal of Therapy:  INR 2-3   Plan:  1. Coumadin 4mg  PO x 1 tonight - will likely need to reduce dose tomorrow 2. Continue lovenox for now - can likely DC tomorrow 3. F/u AM INR  Lysle Pearl, PharmD, BCPS Pager # [phone] 10/16/2011 9:19 AM

## 2011-10-16 NOTE — Progress Notes (Signed)
Subjective: Patient seen today doing well.  She readily admits that she's making good progress but remains weak and totally ADL dependence.  Daughter is at the bedside and is pleased with progress but patient does live at home and given his recent illness and her significant decline neither one thing she can make it at home without some short-term rehabilitation.  Intake is good.  No chest pain or shortness of breath.  Bowels have been moving.  Objective: Vital signs in last 24 hours: Temp:  [97.9 F (36.6 C)-100.5 F (38.1 C)] 97.9 F (36.6 C) (08/05 1341) Pulse Rate:  [65-70] 65  (08/05 1341) Resp:  [18-20] 20  (08/05 1341) BP: (89-116)/(46-73) 89/46 mmHg (08/05 1341) SpO2:  [96 %-100 %] 100 % (08/05 1341) Weight:  [82.7 kg (182 lb 5.1 oz)] 82.7 kg (182 lb 5.1 oz) (08/05 0531) Weight change: 0.3 kg (10.6 oz)  CBG (last 3)   Basename 10/16/11 1058 10/16/11 0553 10/15/11 2119  GLUCAP 144* 112* 100*    Intake/Output from previous day: 08/04 0701 - 08/05 0700 In: 2145.8 [P.O.:960; I.V.:895.8; IV Piggyback:50] Out: 1950 [Urine:1950]  Physical Exam: Patient is awake alert no distress.  Lungs are clear bilaterally.  No JVD.  Cardiovascular exam is regular rate and rhythm and no obvious murmur.  Abdomen is soft and nontender.  Extremities are without edema.  Intact distal pulses.  Neurologic exam is normal.   Lab Results:  Basename 10/16/11 0616 10/15/11 0500  NA 137 137  K 3.5 3.3*  CL 100 99  CO2 22 25  GLUCOSE 112* 102*  BUN 35* 29*  CREATININE 1.68* 1.64*  CALCIUM 8.7 9.2  MG -- --  PHOS -- --    Basename 10/16/11 0616 10/15/11 0500  AST 51* 30  ALT 68* 50*  ALKPHOS 99 108  BILITOT 0.5 0.9  PROT 6.6 7.0  ALBUMIN 3.0* 3.3*    Basename 10/16/11 0616 10/15/11 0500 10/14/11 1625  WBC 9.7 13.7* --  NEUTROABS -- -- 15.4*  HGB 12.2 12.5 --  HCT 35.7* 36.9 --  MCV 100.3* 101.9* --  PLT 144* 148* --   Lab Results  Component Value Date   INR 1.82* 10/16/2011   INR  1.38 10/15/2011   INR 1.62* 10/14/2011   No results found for this basename: CKTOTAL:3,CKMB:3,CKMBINDEX:3,TROPONINI:3 in the last 72 hours No results found for this basename: TSH,T4TOTAL,FREET3,T3FREE,THYROIDAB in the last 72 hours No results found for this basename: VITAMINB12:2,FOLATE:2,FERRITIN:2,TIBC:2,IRON:2,RETICCTPCT:2 in the last 72 hours  Studies/Results: Ct Head Wo Contrast  10/14/2011  *RADIOLOGY REPORT*  Clinical Data:  Fall, striking her head on a wall.  The patient is on Coumadin.  Pain in the back of her head.  Neck pain.  No loss of consciousness.  CT HEAD WITHOUT CONTRAST CT CERVICAL SPINE WITHOUT CONTRAST  Technique:  Multidetector CT imaging of the head and cervical spine was performed following the standard protocol without intravenous contrast.  Multiplanar CT image reconstructions of the cervical spine were also generated.  Comparison:  CT head without contrast 03/20/2008.  CT HEAD  Findings: Mild soft tissue swelling and contusion is seen posteriorly near the vertex.  There is no underlying fracture.  The paranasal sinuses and mastoid air cells are clear.  Atherosclerotic calcifications are present within the cavernous carotid arteries bilaterally.  Scattered periventricular subcortical white matter hypoattenuation is stable.  A remote lacunar infarct of the right basal ganglia is again noted.  No acute cortical infarct, hemorrhage, or mass lesion is present. The ventricles are  proportionate to the degree of atrophy.  No significant extra-axial fluid collection is present.  IMPRESSION:  1.  Stable atrophy and extensive white matter disease. 2.  No acute intracranial abnormality. 3.  Soft tissue contusion posteriorly near the vertex without underlying fracture. 4.  Atherosclerosis  CT CERVICAL SPINE  Findings: The cervical spine is imaged from the skull base through T1-2.  Mild degenerative anterolisthesis is present at C3-4, C4-5 and C5-6.  No acute fracture or traumatic subluxation is  evident. Multilevel facet degenerative changes are evident.  The soft tissues demonstrate atherosclerotic calcifications at the right carotid bifurcation without some definite stenosis.  A left-sided pacemaker is in place.  IMPRESSION:  1.  No acute fracture or traumatic subluxation. 2.  Moderate spondylosis of the cervical spine.  Original Report Authenticated By: Jamesetta Orleans. MATTERN, M.D.   Ct Cervical Spine Wo Contrast  10/14/2011  *RADIOLOGY REPORT*  Clinical Data:  Fall, striking her head on a wall.  The patient is on Coumadin.  Pain in the back of her head.  Neck pain.  No loss of consciousness.  CT HEAD WITHOUT CONTRAST CT CERVICAL SPINE WITHOUT CONTRAST  Technique:  Multidetector CT imaging of the head and cervical spine was performed following the standard protocol without intravenous contrast.  Multiplanar CT image reconstructions of the cervical spine were also generated.  Comparison:  CT head without contrast 03/20/2008.  CT HEAD  Findings: Mild soft tissue swelling and contusion is seen posteriorly near the vertex.  There is no underlying fracture.  The paranasal sinuses and mastoid air cells are clear.  Atherosclerotic calcifications are present within the cavernous carotid arteries bilaterally.  Scattered periventricular subcortical white matter hypoattenuation is stable.  A remote lacunar infarct of the right basal ganglia is again noted.  No acute cortical infarct, hemorrhage, or mass lesion is present. The ventricles are proportionate to the degree of atrophy.  No significant extra-axial fluid collection is present.  IMPRESSION:  1.  Stable atrophy and extensive white matter disease. 2.  No acute intracranial abnormality. 3.  Soft tissue contusion posteriorly near the vertex without underlying fracture. 4.  Atherosclerosis  CT CERVICAL SPINE  Findings: The cervical spine is imaged from the skull base through T1-2.  Mild degenerative anterolisthesis is present at C3-4, C4-5 and C5-6.  No acute  fracture or traumatic subluxation is evident. Multilevel facet degenerative changes are evident.  The soft tissues demonstrate atherosclerotic calcifications at the right carotid bifurcation without some definite stenosis.  A left-sided pacemaker is in place.  IMPRESSION:  1.  No acute fracture or traumatic subluxation. 2.  Moderate spondylosis of the cervical spine.  Original Report Authenticated By: Jamesetta Orleans. MATTERN, M.D.     Assessment/Plan: #1 probable UTI Escherichia coli grown low colony count is responding Rocephin we'll convert to cephalexin  #2 cardiomyopathy ejection fraction 20% seems well compensated  #3 essential hypertension stable  #4 atrial fibrillation rate controlled hemolytically stable  #5 gastroesophageal reflux disease stable  #6 overall deconditioning secondary to the urinary tract infection superimposed upon baseline cardiac disease will need skilled nursing  Plan we'll convert to oral antibiotics and target skilled nursing we'll consult social services   LOS: 2 days   Sherline Eberwein A 10/16/2011, 1:46 PM

## 2011-10-16 NOTE — Progress Notes (Signed)
Pt noted having a 5 beat run of VTach, first episode this admission. Pt asymptomatic at time. MD on call notified, no new orders at this time. Will address during rounds.

## 2011-10-16 NOTE — Evaluation (Signed)
Occupational Therapy Evaluation Patient Details Name: [name] MRN: [id number] DOB: [birth year] Today's Date: 10/16/2011 Time: 1324-4010 OT Time Calculation (min): 29 min  OT Assessment / Plan / Recommendation Clinical Impression  Pt presents to OT s/p UTI.  Pt will benefit from OT to increase I with ADL activity and return  to [facility]    OT Assessment  Patient needs continued OT Services    Follow Up Recommendations  Skilled nursing facility       Equipment Recommendations  Defer to next venue       Frequency  Min 2X/week    Precautions / Restrictions Precautions Precautions: Fall Restrictions Weight Bearing Restrictions: No       ADL  Grooming: Performed;Wash/dry hands Where Assessed - Grooming: Supported standing;Other (comment) (with walker) Upper Body Bathing: Simulated;Minimal assistance Where Assessed - Upper Body Bathing: Supported sit to stand Lower Body Bathing: Simulated;Moderate assistance Where Assessed - Lower Body Bathing: Supported sit to stand Upper Body Dressing: Simulated;Minimal assistance Where Assessed - Upper Body Dressing: Unsupported sitting Lower Body Dressing: Simulated;Moderate assistance Where Assessed - Lower Body Dressing: Supported sit to stand Toilet Transfer: Performed;Minimal assistance Toilet Transfer Method: Sit to stand;Other (comment) (walk to bathroom) Toilet Transfer Equipment: Regular height toilet;Grab bars Toileting - Clothing Manipulation and Hygiene: Performed;Minimal assistance Where Assessed - Glass blower/designer Manipulation and Hygiene: Standing Tub/Shower Transfer Method: Not assessed    OT Diagnosis: Generalized weakness  OT Problem List: Decreased strength;Decreased activity tolerance OT Treatment Interventions: Self-care/ADL training   OT Goals Acute Rehab OT Goals Time For Goal Achievement: 10/30/11 Potential to Achieve Goals: Good  Visit Information  Last OT Received On: 10/16/11 Assistance Needed: +1      Prior Functioning  Vision/Perception  Home Living Lives With: Alone Available Help at Discharge: Family;Friend(s);Other (Comment) (Housekeeper 3 days/week ) Type of Home: Other (Comment) (Townhome) Home Access: Stairs to enter [employer] of Steps: 1 Entrance Stairs-Rails: None Home Layout: One level Bathroom Shower/Tub: Walk-in shower (to be determined) Firefighter: Standard Home Adaptive Equipment: Walker - rolling Prior Function Level of Independence: Independent with assistive device(s) Able to Take Stairs?: Yes (single step to enter home) Driving: No Communication Communication: No difficulties Dominant Hand: Right      Cognition  Overall Cognitive Status: Appears within functional limits for tasks assessed/performed Arousal/Alertness: Awake/alert Orientation Level: Appears intact for tasks assessed Behavior During Session: [facility] for tasks performed    Extremity/Trunk Assessment Right Upper Extremity Assessment RUE ROM/Strength/Tone: [facility] for tasks assessed Left Upper Extremity Assessment LUE ROM/Strength/Tone: WFL for tasks assessed   Mobility Bed Mobility Bed Mobility: Rolling Right;Right Sidelying to Sit Rolling Right: 4: Min assist;With rail Right Sidelying to Sit: 4: Min assist Details for Bed Mobility Assistance: cues for technique and safety Transfers Transfers: Stand to Sit;Sit to Stand Sit to Stand: 4: Min assist;With upper extremity assist;From chair/3-in-1;From toilet Stand to Sit: 4: Min assist;With upper extremity assist;To toilet;To chair/3-in-1 Details for Transfer Assistance: cues for safety and technique         End of Session OT - End of Session Patient left: in chair;with call bell/phone within reach      [facility], [place] Kung 10/16/2011, 11:35 AM

## 2011-10-16 NOTE — Clinical Documentation Improvement (Signed)
CHF DOCUMENTATION CLARIFICATION QUERY  THIS DOCUMENT IS NOT A PERMANENT PART OF THE MEDICAL RECORD  Please update your documentation within the medical record to reflect your response to this query.                                                                                     10/16/11  Dr. Jacky Kindle and/or Associates,  In a better effort to capture your patient's severity of illness, reflect appropriate length of stay and utilization of resources, a review of the patient medical record has revealed the following indicators:  "cardiomyopathy ejection fraction 20% seems well compensated" Hoffman,Kristen A  10/16/2011, 1:46 PM     Progress Note  Past Medical History includes: CHF;  Nonischemic cardiomyopathy  AVVA,RAVISANKAR R  10/14/2011, 8:23 PM     H&P  Home Medications Include:  Aldactone;  Avapro;  Lasix AVVA,RAVISANKAR R  10/14/2011, 8:23 PM     H&P   Based on your clinical judgment, please document in the progress notes and discharge summary if a condition below provides greater specificity regarding the patient's cardiac status:   - Chronic Systolic Heart Failure, compensated   - Other Condition   - Unable to Clinically Determine   In responding to this query please exercise your independent judgment.    The fact that a query is asked, does not imply that any particular answer is desired or expected.   Reviewed: 10/17/11 - "cshf" documented in dc summary by Jacky Kindle - [email or web address].  Mathis Dad RN  Thank You,  Jerral Ralph  RN BSN CCDS Certified Clinical Documentation Specialist: Cell   [phone]  Health Information Management [place]   TO RESPOND TO THE THIS QUERY, FOLLOW THE INSTRUCTIONS BELOW:  1. If needed, update documentation for the patient's encounter via the notes activity.  2. Access this query again and click edit on the In [employer].  3. After updating, or not, click F2 to complete all highlighted (required) fields concerning your review.  Select "additional documentation in the medical record" OR "no additional documentation provided".  4. Click Sign note button.  5. The deficiency will fall out of your In Basket *Please let us know if you are not able to complete this workflow by phone or e-mail (listed below).

## 2011-10-16 NOTE — Progress Notes (Signed)
Physical Therapy Treatment Patient Details Name: [name] MRN: [id number] DOB: [birth year] Today's Date: 10/16/2011 Time: 4098-1191 PT Time Calculation (min): 26 min  PT Assessment / Plan / Recommendation Comments on Treatment Session  Pt. just indicating for the first time to family that she had become incontinent of urine 2 days PTA.  Pt. expressing concern for Dr. order to remove foley.  Discussed with pt and daughter that adult pull-ups might alleviate her fears and provide a safer option for her than if she did have an episode of urine in the floor,.  Pt. and daughter agree that this is a good solution and daughter to obtain them today.  RN has arranged to DC foley once pull ups arrive.  Pt. tolerated more activity and mobility today.    Follow Up Recommendations  Skilled nursing facility    Barriers to Discharge        Equipment Recommendations  Defer to next venue    Recommendations for Other Services    Frequency Min 3X/week   Plan Discharge plan remains appropriate    Precautions / Restrictions Precautions Precautions: Fall Restrictions Weight Bearing Restrictions: No   Pertinent Vitals/Pain Denies pain, no distress    Mobility  Bed Mobility Bed Mobility: Rolling Right;Right Sidelying to Sit Rolling Right: 4: Min assist;With rail Right Sidelying to Sit: 4: Min assist Details for Bed Mobility Assistance: cues for technique and safety Transfers Transfers: Sit to Stand;Stand to Sit Sit to Stand: 4: Min assist;With upper extremity assist;From bed Stand to Sit: 4: Min assist;With upper extremity assist;To chair/3-in-1 Details for Transfer Assistance: cues for safety and technique Ambulation/Gait Ambulation/Gait Assistance: 4: Min assist Ambulation Distance (Feet): 15 Feet Assistive device: Rolling walker Ambulation/Gait Assistance Details: Pt. needed assist and cues for upright posture and balance, assist to negotiate around items in the room Gait Pattern:  Step-through pattern;Trunk flexed Gait velocity: slowed    Exercises     PT Diagnosis:    PT Problem List:   PT Treatment Interventions:     PT Goals Acute Rehab PT Goals PT Goal: Supine/Side to Sit - Progress: Progressing toward goal PT Goal: Sit to Stand - Progress: Progressing toward goal PT Goal: Stand to Sit - Progress: Progressing toward goal PT Goal: Ambulate - Progress: Progressing toward goal  Visit Information  Last PT Received On: 10/16/11 Assistance Needed: +1    Subjective Data  Subjective: Pt. reports she had become incontinent of urine 2 days prior to admission and is concerned ablut foley being taken out.   Cognition  Overall Cognitive Status: Appears within functional limits for tasks assessed/performed Arousal/Alertness: Awake/alert Orientation Level: Appears intact for tasks assessed Behavior During Session: [facility] for tasks performed    Balance     End of Session PT - End of Session Equipment Utilized During Treatment: Gait belt Activity Tolerance: Patient tolerated treatment well;Patient limited by fatigue Patient left: in chair;with call bell/phone within reach;with family/visitor present Nurse Communication: Mobility status   GP     [name] 10/16/2011, 10:46 AM Weldon Picking PT Acute Rehab Services [phone] [place] [phone]

## 2011-10-16 NOTE — Progress Notes (Signed)
Utilization review completed.  

## 2011-10-16 NOTE — Progress Notes (Signed)
Notified MD of no sliding scale order for CBGs for informational purposes to see if the doctor wanted that for patient. No orders given but MD stated will put in SSI range for patient. Will continue to monitor.

## 2011-10-17 LAB — CBC
Hemoglobin: 12.2 g/dL (ref 12.0–15.0)
MCHC: 34.2 g/dL (ref 30.0–36.0)
RBC: 3.55 MIL/uL — ABNORMAL LOW (ref 3.87–5.11)
WBC: 7.5 10*3/uL (ref 4.0–10.5)

## 2011-10-17 LAB — PROTIME-INR
INR: 2.13 — ABNORMAL HIGH (ref 0.00–1.49)
Prothrombin Time: 24.2 seconds — ABNORMAL HIGH (ref 11.6–15.2)

## 2011-10-17 LAB — URINE CULTURE: Colony Count: 75000

## 2011-10-17 LAB — COMPREHENSIVE METABOLIC PANEL
BUN: 35 mg/dL — ABNORMAL HIGH (ref 6–23)
CO2: 24 mEq/L (ref 19–32)
Calcium: 8.9 mg/dL (ref 8.4–10.5)
Creatinine, Ser: 1.65 mg/dL — ABNORMAL HIGH (ref 0.50–1.10)
GFR calc Af Amer: 32 mL/min — ABNORMAL LOW (ref >90)
GFR calc non Af Amer: 28 mL/min — ABNORMAL LOW (ref >90)
Glucose, Bld: 110 mg/dL — ABNORMAL HIGH (ref 70–99)

## 2011-10-17 LAB — GLUCOSE, CAPILLARY: Glucose-Capillary: 100 mg/dL — ABNORMAL HIGH (ref 70–99)

## 2011-10-17 MED ORDER — WARFARIN SODIUM 4 MG PO TABS
4.0000 mg | ORAL_TABLET | Freq: Once | ORAL | Status: DC
  Filled 2011-10-17: qty 1

## 2011-10-17 MED ORDER — CEPHALEXIN 500 MG PO CAPS: 500.0000 mg | ORAL_CAPSULE | Freq: Three times a day (TID) | ORAL | Status: AC

## 2011-10-17 MED ORDER — BENZONATATE 100 MG PO CAPS
100.0000 mg | ORAL_CAPSULE | Freq: Three times a day (TID) | ORAL | Status: DC | PRN
  Filled 2011-10-17: qty 1

## 2011-10-17 NOTE — Discharge Summary (Signed)
DISCHARGE SUMMARY  [name]  MR#: [id number]  DOB:October 05, 1926  Date of Admission: 10/14/2011 Date of Discharge: 10/17/2011  Attending Physician:Katrinna Travieso A  Patient's WUJ:WJXBJYNW,GNFAOZ G, MD  Consults:  none0  Discharge Diagnoses: Active Problems:  HYPERTENSION  CARDIOMYOPATHY, PRIMARY  Atrial fibrillation  Chronic systolic heart failure  GERD  UTI (lower urinary tract infection)  Weakness generalized Diabetes mellitus type 2 diet controlled Chronic atrial fibrillation on Coumadin  Discharge Medications: Medication List  As of 10/17/2011 11:14 AM   TAKE these medications         acetaminophen 325 MG tablet   Commonly known as: TYLENOL   Take 650 mg by mouth every 6 (six) hours as needed. For pain      amiodarone 200 MG tablet   Commonly known as: PACERONE   Take one tablet by mouth twice daily.      azelastine 0.05 % ophthalmic solution   Commonly known as: OPTIVAR   Place 1 drop into both eyes 2 (two) times daily.      cephALEXin 500 MG capsule   Commonly known as: KEFLEX   Take 1 capsule (500 mg total) by mouth every 8 (eight) hours.      CREON 12000 UNITS Cpep   Generic drug: lipase/protease/amylase   Take 1 capsule by mouth 3 (three) times daily before meals.      desloratadine 5 MG tablet   Commonly known as: CLARINEX   Take 5 mg by mouth daily.      fish oil-omega-3 fatty acids 1000 MG capsule   Take 2 g by mouth daily.      furosemide 40 MG tablet   Commonly known as: LASIX   Take 40-80 mg by mouth 2 (two) times daily. Take 80 mg in the morning and 40 mg in the evening      irbesartan 75 MG tablet   Commonly known as: AVAPRO   Take 75 mg by mouth at bedtime.      levothyroxine 100 MCG tablet   Commonly known as: SYNTHROID, LEVOTHROID   Take 100 mcg by mouth daily.      SINGULAIR 10 MG tablet   Generic drug: montelukast   Take 10 mg by mouth at bedtime.      spironolactone 25 MG tablet   Commonly known as: ALDACTONE   Take 25 mg  by mouth daily.      SYSTANE ULTRA OP   Apply 3 drops to eye 3 (three) times daily.      warfarin 4 MG tablet   Commonly known as: COUMADIN   Take 2-4 mg by mouth daily. Take 2 mg except on Thursday take 4 mg            Hospital Procedures: Ct Head Wo Contrast  10/14/2011  *RADIOLOGY REPORT*  Clinical Data:  Fall, striking her head on a wall.  The patient is on Coumadin.  Pain in the back of her head.  Neck pain.  No loss of consciousness.  CT HEAD WITHOUT CONTRAST CT CERVICAL SPINE WITHOUT CONTRAST  Technique:  Multidetector CT imaging of the head and cervical spine was performed following the standard protocol without intravenous contrast.  Multiplanar CT image reconstructions of the cervical spine were also generated.  Comparison:  CT head without contrast 03/20/2008.  CT HEAD  Findings: Mild soft tissue swelling and contusion is seen posteriorly near the vertex.  There is no underlying fracture.  The paranasal sinuses and mastoid air cells are clear.  Atherosclerotic calcifications are  present within the cavernous carotid arteries bilaterally.  Scattered periventricular subcortical white matter hypoattenuation is stable.  A remote lacunar infarct of the right basal ganglia is again noted.  No acute cortical infarct, hemorrhage, or mass lesion is present. The ventricles are proportionate to the degree of atrophy.  No significant extra-axial fluid collection is present.  IMPRESSION:  1.  Stable atrophy and extensive white matter disease. 2.  No acute intracranial abnormality. 3.  Soft tissue contusion posteriorly near the vertex without underlying fracture. 4.  Atherosclerosis  CT CERVICAL SPINE  Findings: The cervical spine is imaged from the skull base through T1-2.  Mild degenerative anterolisthesis is present at C3-4, C4-5 and C5-6.  No acute fracture or traumatic subluxation is evident. Multilevel facet degenerative changes are evident.  The soft tissues demonstrate atherosclerotic  calcifications at the right carotid bifurcation without some definite stenosis.  A left-sided pacemaker is in place.  IMPRESSION:  1.  No acute fracture or traumatic subluxation. 2.  Moderate spondylosis of the cervical spine.  Original Report Authenticated By: Jamesetta Orleans. MATTERN, M.D.   Ct Cervical Spine Wo Contrast  10/14/2011  *RADIOLOGY REPORT*  Clinical Data:  Fall, striking her head on a wall.  The patient is on Coumadin.  Pain in the back of her head.  Neck pain.  No loss of consciousness.  CT HEAD WITHOUT CONTRAST CT CERVICAL SPINE WITHOUT CONTRAST  Technique:  Multidetector CT imaging of the head and cervical spine was performed following the standard protocol without intravenous contrast.  Multiplanar CT image reconstructions of the cervical spine were also generated.  Comparison:  CT head without contrast 03/20/2008.  CT HEAD  Findings: Mild soft tissue swelling and contusion is seen posteriorly near the vertex.  There is no underlying fracture.  The paranasal sinuses and mastoid air cells are clear.  Atherosclerotic calcifications are present within the cavernous carotid arteries bilaterally.  Scattered periventricular subcortical white matter hypoattenuation is stable.  A remote lacunar infarct of the right basal ganglia is again noted.  No acute cortical infarct, hemorrhage, or mass lesion is present. The ventricles are proportionate to the degree of atrophy.  No significant extra-axial fluid collection is present.  IMPRESSION:  1.  Stable atrophy and extensive white matter disease. 2.  No acute intracranial abnormality. 3.  Soft tissue contusion posteriorly near the vertex without underlying fracture. 4.  Atherosclerosis  CT CERVICAL SPINE  Findings: The cervical spine is imaged from the skull base through T1-2.  Mild degenerative anterolisthesis is present at C3-4, C4-5 and C5-6.  No acute fracture or traumatic subluxation is evident. Multilevel facet degenerative changes are evident.  The soft  tissues demonstrate atherosclerotic calcifications at the right carotid bifurcation without some definite stenosis.  A left-sided pacemaker is in place.  IMPRESSION:  1.  No acute fracture or traumatic subluxation. 2.  Moderate spondylosis of the cervical spine.  Original Report Authenticated By: Jamesetta Orleans. MATTERN, M.D.    History of Present Illness:  Patient presents from home with a two-day history of generalized weakness, with one fall fall using her walker, striking the back of her head without loss of consciousness earlier today, transported to the emergency room by EMS. In retrospect patient states that her appetite has been suppressed for approximately 2 days associated with fevers and chills and general weakness. She denies any chest pain, palpitations, shortness of breath, cough, overt vomiting, change in bowel habits, bleeding complications from her Coumadin specifically no issues with blood in the urine or  stool. She does live at home independently with the support of her daughter who lives locally, does have a housekeeper who comes in on a regular basis and for the most part is able to perform most of her ADLs with support of her housekeeper. Does not drive secondary to macular degeneration. Has no issues with taking her medications and has been compliant with the same however has been progressively weak her office visit notes over the last several weeks if not months, and apparently family has brought up the role for question of transfer to assisted living facility for closer management.  In the emergency room, patient hemodynamically stable, head CT unremarkable for internal bleeding while on Coumadin given fall, neurologically intact, labs is significant for possibly mild prerenal azotemia as well as urinary tract infection severe. Patient week, given IV fluids, despite the administration of antibiotics and IV patient continued to feel weak and was unable to transfer home in a safe manner.  She is now being admitted for further evaluation and management of multiple medical problems including generalized weakness, physical deconditioning, significant urinary tract infection in the setting of cardiomyopathy and anticoagulation.  Hospital Course: Patient was admitted to the medical service placed on IV fluids at a low rate given her cardiomyopathy and empiric IV antibiotics. Presumably, interim or intercurrent UTI precipitated her acute decline and resultant fall. She did have a low-level Escherichia coli out of her urine appropriately treated with Rocephin and transition to oral cephalexin with a drop in white blood cell count. She's remained stable from a cardiopulmonary standpoint. Her renal insufficiency is at baseline. Her blood sugars have been stable. Coumadin was managed by the pharmacist. At the time of discharge she sitting up in a chair markedly deconditioned starting to resume poor oral intake well. She is transition to skilled nursing for short-term rehabilitation prior to return home to independent living.  Day of Discharge Exam BP 103/68  Pulse 70  Temp 97.9 F (36.6 C) (Oral)  Resp 18  Ht 5\' 4"  (1.626 m)  Wt 81.965 kg (180 lb 11.2 oz)  BMI 31.02 kg/m2  SpO2 97%  Physical Exam: General appearance: alert, cooperative and no distress Eyes: no scleral icterus Throat: oropharynx moist without erythema Resp: clear to auscultation bilaterally Cardio: irregularly irregular rhythm Extremities: no clubbing, cyanosis or edema Abdomen is soft nontender benign Logic exam is normal  Discharge Labs:  [facility] 10/17/11 0530 10/16/11 0616  NA 139 137  K 3.8 3.5  CL 103 100  CO2 24 22  GLUCOSE 110* 112*  BUN 35* 35*  CREATININE 1.65* 1.68*  CALCIUM 8.9 8.7  MG -- --  PHOS -- --    Basename 10/17/11 0530 10/16/11 0616  AST 69* 51*  ALT 96* 68*  ALKPHOS 106 99  BILITOT 0.5 0.5  PROT 6.6 6.6  ALBUMIN 2.9* 3.0*    Basename 10/17/11 0530 10/16/11 0616 10/14/11  1625  WBC 7.5 9.7 --  NEUTROABS -- -- 15.4*  HGB 12.2 12.2 --  HCT 35.7* 35.7* --  MCV 100.6* 100.3* --  PLT 176 144* --   No results found for this basename: CKTOTAL:3,CKMB:3,CKMBINDEX:3,TROPONINI:3 in the last 72 hours No results found for this basename: TSH,T4TOTAL,FREET3,T3FREE,THYROIDAB in the last 72 hours No results found for this basename: VITAMINB12:2,FOLATE:2,FERRITIN:2,TIBC:2,IRON:2,RETICCTPCT:2 in the last 72 hours  Discharge instructions: Discharge Orders    Future Orders Please Complete By Expires   Diet - low sodium heart healthy      Increase activity slowly  Disposition: Skilled nursing  Follow-up Appts: Skilled nursing physicians and subsequently [facility] medical.  Condition on Discharge: Improved and stable condition  Tests Needing Follow-up: Monitoring of Coumadin as well as CBC and electrolytes OT PT aggressively for restoration of ADLs and functionality  Signed: Chaquana Nichols A 10/17/2011, 11:14 AM

## 2011-10-17 NOTE — Progress Notes (Signed)
Occupational Therapy Treatment Patient Details Name: [name] MRN: [id number] DOB: [birth year] Today's Date: 10/17/2011 Time: 0800-0830 OT Time Calculation (min): 30 min  OT Assessment / Plan / Recommendation          Equipment Recommendations  Defer to next venue          Plan Discharge plan remains appropriate           ADL  Grooming: Performed;Wash/dry face;Teeth care;Brushing hair;Min guard Where Assessed - Grooming: Other (comment) (at sink) Toilet Transfer Method: Sit to stand;Other (comment) (walk to bathroom with walker) Toilet Transfer Equipment: Comfort height toilet;Grab bars Toileting - Clothing Manipulation and Hygiene: Performed;Min guard Where Assessed - Toileting Clothing Manipulation and Hygiene: Standing Transfers/Ambulation Related to ADLs: Pt much improved today with increased tolerance for activity      OT Goals ADL Goals Pt Will Perform Grooming: with modified independence;Standing at sink ADL Goal: Grooming - Progress: Progressing toward goals Pt Will Transfer to Toilet: with modified independence;Comfort height toilet ADL Goal: Toilet Transfer - Progress: Progressing toward goals Pt Will Perform Toileting - Clothing Manipulation: with modified independence;Standing ADL Goal: Toileting - Clothing Manipulation - Progress: Progressing toward goals Pt Will Perform Toileting - Hygiene: with modified independence;Standing at 3-in-1/toilet ADL Goal: Toileting - Hygiene - Progress: Progressing toward goals  Visit Information  Last OT Received On: 10/17/11          Cognition  Overall Cognitive Status: Appears within functional limits for tasks assessed/performed Arousal/Alertness: Awake/alert Orientation Level: Appears intact for tasks assessed Behavior During Session: [facility] for tasks performed    Mobility Transfers Transfers: Stand to Sit;Sit to Stand Sit to Stand: With upper extremity assist;From chair/3-in-1;From toilet;4: Min guard Stand to  Sit: With upper extremity assist;To toilet;To chair/3-in-1;4: Min guard         End of Session OT - End of Session Activity Tolerance: Patient tolerated treatment well Patient left: in chair;with call bell/phone within reach;with family/visitor present  GO     [name], [place] 10/17/2011, 8:54 AM

## 2011-10-17 NOTE — Progress Notes (Signed)
Clinical social worker assisted with patient discharge to skilled nursing facility,[facility].  CSW addressed all family questions and concerns. CSW copied chart and added all important documents. Patient will be transferred by private vehicle. Clinical Social Worker will sign off for now as social work intervention is no longer needed.  Sabino Niemann, MSW, [employer] [phone]

## 2011-10-17 NOTE — Progress Notes (Signed)
Discharge instructions given to pt and daughter . Verbalized understanding.discharge paperworks with daughter for [facility] nursing facility . Transported pt to [facility] by private vehicle by daughter

## 2011-10-17 NOTE — Progress Notes (Signed)
Family has chosen a bed at [facility]. CSW contacted the facility to make them aware of this.   Sabino Niemann, MSW, [employer] [phone]

## 2011-10-17 NOTE — Progress Notes (Signed)
Physical Therapy Treatment Patient Details Name: [name] MRN: [id number] DOB: Mar 12, 1927 Today's Date: 10/17/2011 Time: 4098-1191 PT Time Calculation (min): 17 min  PT Assessment / Plan / Recommendation Comments on Treatment Session  Pt. currently wearing her Depends and was able to concentrate her energies on progressive ambulation.  She fatigues quickly and did have one episode of feeling her  legs would buckle, but they did not.    Follow Up Recommendations  Skilled nursing facility    Barriers to Discharge        Equipment Recommendations  Defer to next venue    Recommendations for Other Services    Frequency Min 3X/week   Plan Discharge plan remains appropriate    Precautions / Restrictions Precautions Precautions: Fall Restrictions Weight Bearing Restrictions: No   Pertinent Vitals/Pain Denies pain, O2 sats stable on RA and walking    Mobility  Bed Mobility Bed Mobility: Not assessed Transfers Transfers: Sit to Stand;Stand to Sit Sit to Stand: 4: Min guard;With upper extremity assist;From chair/3-in-1 Stand to Sit: 4: Min guard;With upper extremity assist;To chair/3-in-1 Details for Transfer Assistance: cues for safety and technique; reminders to reach back when sitting down. Ambulation/Gait Ambulation/Gait Assistance: 4: Min assist Ambulation Distance (Feet): 70 Feet Assistive device: Rolling walker Ambulation/Gait Assistance Details: Pt. needs postural cues to stand erect, needed safety cues for RW safety and min assist for stability  Gait Pattern: Step-through pattern;Trunk flexed Stairs: No Wheelchair Mobility Wheelchair Mobility: No    Exercises     PT Diagnosis:    PT Problem List:   PT Treatment Interventions:     PT Goals Acute Rehab PT Goals PT Goal: Sit to Stand - Progress: Progressing toward goal PT Goal: Stand to Sit - Progress: Progressing toward goal PT Goal: Ambulate - Progress: Progressing toward goal  Visit Information  Last PT  Received On: 10/17/11 Assistance Needed: +1    Subjective Data  Subjective: Pt. reports she feels like she is catching a cold.   Cognition  Overall Cognitive Status: Appears within functional limits for tasks assessed/performed Arousal/Alertness: Awake/alert Orientation Level: Appears intact for tasks assessed Behavior During Session: [facility] for tasks performed    Balance     End of Session PT - End of Session Equipment Utilized During Treatment: Gait belt Activity Tolerance: Patient tolerated treatment well;Patient limited by fatigue Patient left: in chair;with call bell/phone within reach;with family/visitor present Nurse Communication: Mobility status   GP     [name] 10/17/2011, 9:30 AM Weldon Picking PT Acute Rehab Services [phone] [facility] [phone]

## 2011-10-17 NOTE — Progress Notes (Signed)
ANTICOAGULATION CONSULT NOTE - Follow up Consult  Pharmacy Consult for Coumadin Indication: atrial fibrillation  Allergies  Allergen Reactions  . Iodinated Diagnostic Agents     Unknown    Patient Measurements: Height: 5\' 4"  (162.6 cm) Weight: 180 lb 11.2 oz (81.965 kg) (a scale) IBW/kg (Calculated) : 54.7   Vital Signs: Temp: 97.9 F (36.6 C) (08/06 0543) Temp src: Oral (08/06 0543) BP: 100/56 mmHg (08/06 0543) Pulse Rate: 70  (08/06 0543)  Labs:  Basename 10/17/11 0530 10/16/11 0616 10/15/11 0500 10/14/11 1625  HGB 12.2 12.2 -- --  HCT 35.7* 35.7* 36.9 --  PLT 176 144* 148* --  APTT -- -- -- 47*  LABPROT 24.2* 21.4* 17.2* --  INR 2.13* 1.82* 1.38 --  HEPARINUNFRC -- -- -- --  CREATININE 1.65* 1.68* 1.64* --  CKTOTAL -- -- -- --  CKMB -- -- -- --  TROPONINI -- -- -- --    Estimated Creatinine Clearance: 26.8 ml/min (by C-G formula based on Cr of 1.65).  Assessment: 78yo female admitted s/p fall due to generalized weakness, found with UTI and hypotension, to continue Coumadin for Afib; admitted with subtherapeutic INR; CT negative for bleed.  Today INR is therapeutic at 2.13. No bleeding noted. H/H are stable and platelets have improved. Prophylactic lovenox was started due to subtherapeutic INR and can now be discontinued.   Goal of Therapy:  INR 2-3   Plan:  1. Coumadin 4mg  PO x 1 tonight  2. DC lovenox 3. F/u AM INR  Lysle Pearl, PharmD, BCPS Pager # [phone] 10/17/2011 8:41 AM

## 2011-12-20 ENCOUNTER — Telehealth: Payer: Self-pay | Admitting: Internal Medicine

## 2011-12-20 NOTE — Telephone Encounter (Signed)
Will forward to Dr. Klein for review. 

## 2011-12-20 NOTE — Telephone Encounter (Signed)
Pt needs to be off coumadin for 5 days for spinal epidural, call dr Ethelene Hal [phone]

## 2011-12-20 NOTE — Telephone Encounter (Signed)
With no known stroke, she can stop her coumadin for her procedure as outlined thank steve

## 2011-12-21 NOTE — Telephone Encounter (Signed)
Dr  Ramos notified

## 2012-01-29 ENCOUNTER — Inpatient Hospital Stay (HOSPITAL_COMMUNITY): Payer: Medicare Other

## 2012-01-29 ENCOUNTER — Encounter (HOSPITAL_COMMUNITY): Payer: Self-pay | Admitting: General Practice

## 2012-01-29 ENCOUNTER — Inpatient Hospital Stay (HOSPITAL_COMMUNITY)
Admission: AD | Admit: 2012-01-29 | Discharge: 2012-02-12 | DRG: 391 | Disposition: A | Discharge status: Skilled Nursing Facility | Payer: Medicare Other | Source: Ambulatory Visit | Attending: Internal Medicine | Admitting: Internal Medicine

## 2012-01-29 DIAGNOSIS — J45909 Unspecified asthma, uncomplicated: Secondary | ICD-10-CM

## 2012-01-29 DIAGNOSIS — Z79899 Other long term (current) drug therapy: Secondary | ICD-10-CM

## 2012-01-29 DIAGNOSIS — I129 Hypertensive chronic kidney disease with stage 1 through stage 4 chronic kidney disease, or unspecified chronic kidney disease: Secondary | ICD-10-CM | POA: Diagnosis present

## 2012-01-29 DIAGNOSIS — K222 Esophageal obstruction: Principal | ICD-10-CM | POA: Diagnosis present

## 2012-01-29 DIAGNOSIS — I10 Essential (primary) hypertension: Secondary | ICD-10-CM | POA: Diagnosis present

## 2012-01-29 DIAGNOSIS — E7800 Pure hypercholesterolemia, unspecified: Secondary | ICD-10-CM | POA: Diagnosis present

## 2012-01-29 DIAGNOSIS — I428 Other cardiomyopathies: Secondary | ICD-10-CM | POA: Diagnosis present

## 2012-01-29 DIAGNOSIS — Z7901 Long term (current) use of anticoagulants: Secondary | ICD-10-CM

## 2012-01-29 DIAGNOSIS — I5023 Acute on chronic systolic (congestive) heart failure: Secondary | ICD-10-CM | POA: Diagnosis not present

## 2012-01-29 DIAGNOSIS — I472 Ventricular tachycardia, unspecified: Secondary | ICD-10-CM | POA: Diagnosis not present

## 2012-01-29 DIAGNOSIS — R4701 Aphasia: Secondary | ICD-10-CM | POA: Diagnosis not present

## 2012-01-29 DIAGNOSIS — E119 Type 2 diabetes mellitus without complications: Secondary | ICD-10-CM | POA: Diagnosis present

## 2012-01-29 DIAGNOSIS — K5730 Diverticulosis of large intestine without perforation or abscess without bleeding: Secondary | ICD-10-CM

## 2012-01-29 DIAGNOSIS — I059 Rheumatic mitral valve disease, unspecified: Secondary | ICD-10-CM | POA: Diagnosis present

## 2012-01-29 DIAGNOSIS — R5381 Other malaise: Secondary | ICD-10-CM | POA: Diagnosis not present

## 2012-01-29 DIAGNOSIS — R1310 Dysphagia, unspecified: Secondary | ICD-10-CM | POA: Diagnosis present

## 2012-01-29 DIAGNOSIS — N179 Acute kidney failure, unspecified: Secondary | ICD-10-CM | POA: Diagnosis not present

## 2012-01-29 DIAGNOSIS — K219 Gastro-esophageal reflux disease without esophagitis: Secondary | ICD-10-CM | POA: Diagnosis present

## 2012-01-29 DIAGNOSIS — Z8719 Personal history of other diseases of the digestive system: Secondary | ICD-10-CM

## 2012-01-29 DIAGNOSIS — D509 Iron deficiency anemia, unspecified: Secondary | ICD-10-CM | POA: Diagnosis present

## 2012-01-29 DIAGNOSIS — G2581 Restless legs syndrome: Secondary | ICD-10-CM | POA: Diagnosis present

## 2012-01-29 DIAGNOSIS — Z96649 Presence of unspecified artificial hip joint: Secondary | ICD-10-CM

## 2012-01-29 DIAGNOSIS — R531 Weakness: Secondary | ICD-10-CM

## 2012-01-29 DIAGNOSIS — I959 Hypotension, unspecified: Secondary | ICD-10-CM

## 2012-01-29 DIAGNOSIS — G459 Transient cerebral ischemic attack, unspecified: Secondary | ICD-10-CM | POA: Diagnosis not present

## 2012-01-29 DIAGNOSIS — Z95810 Presence of automatic (implantable) cardiac defibrillator: Secondary | ICD-10-CM

## 2012-01-29 DIAGNOSIS — Z8674 Personal history of sudden cardiac arrest: Secondary | ICD-10-CM

## 2012-01-29 DIAGNOSIS — R269 Unspecified abnormalities of gait and mobility: Secondary | ICD-10-CM | POA: Diagnosis present

## 2012-01-29 DIAGNOSIS — D126 Benign neoplasm of colon, unspecified: Secondary | ICD-10-CM

## 2012-01-29 DIAGNOSIS — N390 Urinary tract infection, site not specified: Secondary | ICD-10-CM

## 2012-01-29 DIAGNOSIS — N183 Chronic kidney disease, stage 3 unspecified: Secondary | ICD-10-CM | POA: Diagnosis present

## 2012-01-29 DIAGNOSIS — I509 Heart failure, unspecified: Secondary | ICD-10-CM | POA: Diagnosis present

## 2012-01-29 DIAGNOSIS — N189 Chronic kidney disease, unspecified: Secondary | ICD-10-CM | POA: Diagnosis present

## 2012-01-29 DIAGNOSIS — J9600 Acute respiratory failure, unspecified whether with hypoxia or hypercapnia: Secondary | ICD-10-CM | POA: Diagnosis not present

## 2012-01-29 DIAGNOSIS — I4892 Unspecified atrial flutter: Secondary | ICD-10-CM | POA: Diagnosis present

## 2012-01-29 DIAGNOSIS — Z950 Presence of cardiac pacemaker: Secondary | ICD-10-CM

## 2012-01-29 DIAGNOSIS — E785 Hyperlipidemia, unspecified: Secondary | ICD-10-CM

## 2012-01-29 DIAGNOSIS — E876 Hypokalemia: Secondary | ICD-10-CM | POA: Diagnosis present

## 2012-01-29 DIAGNOSIS — E039 Hypothyroidism, unspecified: Secondary | ICD-10-CM | POA: Diagnosis present

## 2012-01-29 DIAGNOSIS — I5022 Chronic systolic (congestive) heart failure: Secondary | ICD-10-CM

## 2012-01-29 DIAGNOSIS — G458 Other transient cerebral ischemic attacks and related syndromes: Secondary | ICD-10-CM | POA: Diagnosis not present

## 2012-01-29 DIAGNOSIS — I4729 Other ventricular tachycardia: Secondary | ICD-10-CM | POA: Diagnosis not present

## 2012-01-29 DIAGNOSIS — I4891 Unspecified atrial fibrillation: Secondary | ICD-10-CM | POA: Diagnosis present

## 2012-01-29 DIAGNOSIS — I639 Cerebral infarction, unspecified: Secondary | ICD-10-CM | POA: Diagnosis present

## 2012-01-29 HISTORY — DX: Long term (current) use of anticoagulants: Z79.01

## 2012-01-29 HISTORY — DX: Essential (primary) hypertension: I10

## 2012-01-29 HISTORY — DX: Urinary tract infection, site not specified: N39.0

## 2012-01-29 HISTORY — DX: Chronic kidney disease, stage 3 (moderate): N18.3

## 2012-01-29 HISTORY — DX: Chronic kidney disease, stage 3 unspecified: N18.30

## 2012-01-29 HISTORY — DX: Hyperlipidemia, unspecified: E78.5

## 2012-01-29 HISTORY — DX: Presence of cardiac pacemaker: Z95.0

## 2012-01-29 LAB — URINALYSIS, ROUTINE W REFLEX MICROSCOPIC
Nitrite: POSITIVE — AB
Protein, ur: 30 mg/dL — AB
Specific Gravity, Urine: 1.025 (ref 1.005–1.030)
Urobilinogen, UA: 1 mg/dL (ref 0.0–1.0)

## 2012-01-29 LAB — COMPREHENSIVE METABOLIC PANEL
ALT: 35 U/L (ref 0–35)
Calcium: 9 mg/dL (ref 8.4–10.5)
Creatinine, Ser: 1.4 mg/dL — ABNORMAL HIGH (ref 0.50–1.10)
GFR calc Af Amer: 39 mL/min — ABNORMAL LOW (ref >90)
Glucose, Bld: 100 mg/dL — ABNORMAL HIGH (ref 70–99)
Sodium: 144 mEq/L (ref 135–145)
Total Protein: 6.1 g/dL (ref 6.0–8.3)

## 2012-01-29 LAB — GLUCOSE, CAPILLARY: Glucose-Capillary: 111 mg/dL — ABNORMAL HIGH (ref 70–99)

## 2012-01-29 LAB — PROTIME-INR
INR: 5.23 (ref 0.00–1.49)
Prothrombin Time: 44.6 seconds — ABNORMAL HIGH (ref 11.6–15.2)

## 2012-01-29 LAB — CBC
HCT: 36.2 % (ref 36.0–46.0)
Hemoglobin: 12 g/dL (ref 12.0–15.0)
MCHC: 33.1 g/dL (ref 30.0–36.0)
RBC: 3.66 MIL/uL — ABNORMAL LOW (ref 3.87–5.11)
WBC: 11 10*3/uL — ABNORMAL HIGH (ref 4.0–10.5)

## 2012-01-29 LAB — URINE MICROSCOPIC-ADD ON

## 2012-01-29 LAB — PRO B NATRIURETIC PEPTIDE: Pro B Natriuretic peptide (BNP): 15984 pg/mL — ABNORMAL HIGH (ref 0–450)

## 2012-01-29 MED ORDER — ONDANSETRON HCL 4 MG/2ML IJ SOLN
4.0000 mg | Freq: Four times a day (QID) | INTRAMUSCULAR | Status: DC | PRN
  Administered 2012-02-12: 4 mg via INTRAVENOUS
  Filled 2012-01-29: qty 2

## 2012-01-29 MED ORDER — ONDANSETRON HCL 4 MG PO TABS: 4.0000 mg | ORAL_TABLET | Freq: Four times a day (QID) | ORAL | Status: DC | PRN

## 2012-01-29 MED ORDER — ZOLPIDEM TARTRATE 5 MG PO TABS: 5.0000 mg | ORAL_TABLET | Freq: Every evening | ORAL | Status: DC | PRN

## 2012-01-29 MED ORDER — HYDROCODONE-ACETAMINOPHEN 5-325 MG PO TABS
1.0000 | ORAL_TABLET | ORAL | Status: DC | PRN
  Administered 2012-02-02 – 2012-02-09 (×6): 1 via ORAL
  Administered 2012-02-10 – 2012-02-11 (×2): 2 via ORAL
  Filled 2012-01-29: qty 2
  Filled 2012-01-29: qty 1
  Filled 2012-01-29: qty 2
  Filled 2012-01-29 (×2): qty 1
  Filled 2012-01-29: qty 2
  Filled 2012-01-29 (×2): qty 1

## 2012-01-29 MED ORDER — BISACODYL 10 MG RE SUPP: 10.0000 mg | Freq: Every day | RECTAL | Status: DC | PRN

## 2012-01-29 MED ORDER — ACETAMINOPHEN 650 MG RE SUPP: 650.0000 mg | Freq: Four times a day (QID) | RECTAL | Status: DC | PRN

## 2012-01-29 MED ORDER — AMIODARONE HCL 200 MG PO TABS
200.0000 mg | ORAL_TABLET | Freq: Every day | ORAL | Status: DC
  Filled 2012-01-29 (×5): qty 1

## 2012-01-29 MED ORDER — LEVOTHYROXINE SODIUM 100 MCG PO TABS
100.0000 ug | ORAL_TABLET | Freq: Every day | ORAL | Status: DC
  Administered 2012-02-02 – 2012-02-12 (×11): 100 ug via ORAL
  Filled 2012-01-29 (×15): qty 1

## 2012-01-29 MED ORDER — ACETAMINOPHEN 325 MG PO TABS
650.0000 mg | ORAL_TABLET | Freq: Four times a day (QID) | ORAL | Status: DC | PRN
  Administered 2012-02-02 – 2012-02-11 (×9): 650 mg via ORAL
  Filled 2012-01-29 (×8): qty 2

## 2012-01-29 MED ORDER — POTASSIUM CHLORIDE IN NACL 20-0.9 MEQ/L-% IV SOLN
INTRAVENOUS | Status: DC
  Administered 2012-01-29 – 2012-01-31 (×2): via INTRAVENOUS
  Filled 2012-01-29 (×6): qty 1000

## 2012-01-29 NOTE — H&P (Signed)
[name] is an 78 y.o. female.   Chief Complaint: cannot keep anything down HPI:  The patient is an 78 year old Caucasian woman with multiple medical problems, most notably idiopathic dilated cardiomyopathy with paroxysmal atrial fibrillation, and a remote history of gastroesophageal reflux with esophageal stricture who has been unable to keep any food or fluids down for about 3-4 days.  For the past few days she has not been able to keep any food down and has had difficulty keeping taking pills.  Her symptoms were of gradual onset over the past few weeks.  Currently she is feeling a lot weaker and is having difficulty walking.  The regurgitated material has he appearance of food, not that of emesis.  She feels as if there is a high esophageal obstruction.  She has not had significant problems with nausea or abdominal pain and continues to have bowel movements and passage of flatus.  She has not had any fever or chills or dysuria or frequency.  In December 2008 she had endoscopy with esophageal stricture dilatation.  She denies recent problems with worsening shortness of breath, chest pain, productive cough, or headaches.  Past Medical History  Diagnosis Date  . Benign neoplasm of colon   . Diverticulosis of colon (without mention of hemorrhage)   . Reflux esophagitis   . Gastric polyp     history of  . GERD (gastroesophageal reflux disease)   . Stricture and stenosis of esophagus   . Reactive airway disease   . Anemia, iron deficiency   . CHF (congestive heart failure)   . Restless leg syndrome   . Hypercholesterolemia   . Nonischemic cardiomyopathy     EF 20%  . Personal history of sudden cardiac death successfully resuscitated     status post cardioverter- defibrillator  . Atrial fibrillation   . Diabetes mellitus   . Osteoarthritis   . ICD (implantable cardiac defibrillator) in place   . Hypertension   . UTI (lower urinary tract infection)     Medications Prior to Admission    Medication Sig Dispense Refill  . amiodarone (PACERONE) 200 MG tablet Take one tablet by mouth twice daily.  180 tablet  3  . levothyroxine (SYNTHROID, LEVOTHROID) 100 MCG tablet Take 100 mcg by mouth daily.        Bertram Gala Glycol-Propyl Glycol (SYSTANE ULTRA OP) Apply 3 drops to eye 3 (three) times daily.        . [DISCONTINUED] acetaminophen (TYLENOL) 325 MG tablet Take 650 mg by mouth every 6 (six) hours as needed. For pain      . [DISCONTINUED] azelastine (OPTIVAR) 0.05 % ophthalmic solution Place 1 drop into both eyes 2 (two) times daily.        . [DISCONTINUED] desloratadine (CLARINEX) 5 MG tablet Take 5 mg by mouth daily.        . [DISCONTINUED] fish oil-omega-3 fatty acids 1000 MG capsule Take 2 g by mouth daily.        . [DISCONTINUED] furosemide (LASIX) 40 MG tablet Take 40-80 mg by mouth 2 (two) times daily. Take 80 mg in the morning and 40 mg in the evening      . [DISCONTINUED] irbesartan (AVAPRO) 75 MG tablet Take 75 mg by mouth at bedtime.        . [DISCONTINUED] montelukast (SINGULAIR) 10 MG tablet Take 10 mg by mouth at bedtime.        . [DISCONTINUED] Pancrelipase, Lip-Prot-Amyl, (CREON) 12000 UNITS CPEP Take 1 capsule by mouth  3 (three) times daily before meals.       . [DISCONTINUED] spironolactone (ALDACTONE) 25 MG tablet Take 25 mg by mouth daily.        . [DISCONTINUED] warfarin (COUMADIN) 4 MG tablet Take 2-4 mg by mouth daily. Take 2 mg except on Thursday take 4 mg        ADDITIONAL HOME MEDICATIONS: See medication administration list above  PHYSICIANS INVOLVED IN CARE: Jarome Matin (primary care), Yancey Flemings (GI), Sherryl Manges (electrophysiology), Izell Frazer (oph)  Past Surgical History  Procedure Date  . Cardiac defibrillator placement 1999, 2001, 3.30.2012    [facility], Removal of previously implanted BiV ICD followed by insertion of BiV pacemaker with pacemaker pocket revision. without immediate procedural complications.   . Eye surgery 2002     cataract   . Total hip arthroplasty 2004    right  . Insert / replace / remove pacemaker   . Joint replacement     History reviewed. No pertinent family history.   Social History:  reports that she has never smoked. She has never used smokeless tobacco. She reports that she drinks about 3.6 ounces of alcohol per week. She reports that she does not use illicit drugs.  Allergies:  Allergies  Allergen Reactions  . Iodinated Diagnostic Agents     Unknown     ROS: anemia, arthritis, diabetes, heart palpitation, shortness of breath, weight loss and dyspnea on exertion, regurgitation of food, chronic moderate low back pain from spinal stenosis, gait instability with use of a walker  PHYSICAL EXAM: There were no vitals taken for this visit. Vital signs in the office: Blood pressure 134/84, pulse 72, respirations 20, temperature 98.4, pulse oxygen saturation 95% on room air.  In general, she is a mildly overweight white woman who looks fatigued while sitting in a chair.  HEENT exam was within normal limits, neck was supple without jugular venous distention or carotid bruit, chest was clear to auscultation, heart had a regular rate and rhythm without significant murmur or gallop, abdomen had normal bowel sounds and no hepatosplenomegaly or tenderness, extremities were without cyanosis, clubbing, or edema.  She was alert and well oriented with a normal affect and able to move all extremities well.  Results for orders placed during the hospital encounter of 01/29/12 (from the past 48 hour(s))  GLUCOSE, CAPILLARY     Status: Abnormal   Collection Time   01/29/12  5:08 PM      Component Value Range Comment   Glucose-Capillary 111 (*) 70 - 99 mg/dL    No results found.   Assessment/Plan #1 Acute Esophageal Obstruction: She most likely has a high-grade esophageal obstruction given her difficulty keeping any solid food down.  She is not regurgitating saliva which indicates that her  obstruction is not 100%.  She is at high risk for regurgitation with aspiration, warranting inpatient evaluation.  We will set up an expeditious GI evaluation and begin IV fluid hydration.  We are limiting medications to only those that are most important for her. #2 Paroxysmal Atrial Fibrillation: Clinically stable and she appears to remain in a sinus rhythm.  We will check an EKG. #3 Idiopathic Dilated Cardiomyopathy: Stable on current medications and we will check a baseline BNP level. #4 Coumadin Anticoagulation: This is be on hold as she will likely need endoscopy with esophageal stricture dilatation.  We will check her current INR level and hold Coumadin, possibly administering supplemental vitamin K if necessary. #5 Gait Instability: Mild and primarily  from spinal stenosis, with a component of intravascular volume depletion lately.  We will have a physical therapist evaluate the safety of her gait.  Oma Alpert G 01/29/2012, 5:53 PM

## 2012-01-30 ENCOUNTER — Encounter: Payer: Self-pay | Admitting: Cardiology

## 2012-01-30 DIAGNOSIS — I5022 Chronic systolic (congestive) heart failure: Secondary | ICD-10-CM

## 2012-01-30 DIAGNOSIS — R1310 Dysphagia, unspecified: Secondary | ICD-10-CM

## 2012-01-30 DIAGNOSIS — I4891 Unspecified atrial fibrillation: Secondary | ICD-10-CM

## 2012-01-30 DIAGNOSIS — K222 Esophageal obstruction: Principal | ICD-10-CM

## 2012-01-30 LAB — BASIC METABOLIC PANEL
Calcium: 8.4 mg/dL (ref 8.4–10.5)
GFR calc Af Amer: 47 mL/min — ABNORMAL LOW (ref >90)
GFR calc non Af Amer: 40 mL/min — ABNORMAL LOW (ref >90)
Potassium: 3.5 mEq/L (ref 3.5–5.1)
Sodium: 147 mEq/L — ABNORMAL HIGH (ref 135–145)

## 2012-01-30 LAB — PROTIME-INR
INR: 4.62 — ABNORMAL HIGH (ref 0.00–1.49)
Prothrombin Time: 40.7 seconds — ABNORMAL HIGH (ref 11.6–15.2)

## 2012-01-30 LAB — CBC
Hemoglobin: 11.1 g/dL — ABNORMAL LOW (ref 12.0–15.0)
MCH: 32.4 pg (ref 26.0–34.0)
Platelets: 190 10*3/uL (ref 150–400)
RBC: 3.43 MIL/uL — ABNORMAL LOW (ref 3.87–5.11)

## 2012-01-30 LAB — GLUCOSE, CAPILLARY: Glucose-Capillary: 102 mg/dL — ABNORMAL HIGH (ref 70–99)

## 2012-01-30 MED ORDER — PHYTONADIONE 5 MG PO TABS
10.0000 mg | ORAL_TABLET | Freq: Once | ORAL | Status: DC
  Filled 2012-01-30: qty 2

## 2012-01-30 MED ORDER — VITAMIN K1 10 MG/ML IJ SOLN
10.0000 mg | Freq: Once | INTRAMUSCULAR | Status: AC
  Administered 2012-01-30: 10 mg via SUBCUTANEOUS
  Filled 2012-01-30: qty 1

## 2012-01-30 MED ORDER — VITAMIN K1 10 MG/ML IJ SOLN
5.0000 mg | Freq: Once | INTRAVENOUS | Status: DC
  Filled 2012-01-30: qty 0.5

## 2012-01-30 MED ORDER — FUROSEMIDE 10 MG/ML IJ SOLN
40.0000 mg | Freq: Once | INTRAMUSCULAR | Status: AC
  Administered 2012-01-30: 40 mg via INTRAVENOUS
  Filled 2012-01-30: qty 4

## 2012-01-30 NOTE — Plan of Care (Signed)
Problem: Phase I Progression Outcomes Goal: OOB as tolerated unless otherwise ordered Outcome: Progressing Pt attempted to stand but reported significant dizziness Vitals assessed 127/98 HR 69 O2 98% rm air  Charlotte Crumb, Rea DPT  [phone]

## 2012-01-30 NOTE — Consult Note (Signed)
[facility] Consult: 9:08 AM 01/30/2012   Referring Provider: Dossie Arbour  Primary Care Physician:  Garlan Fillers, MD Primary Gastroenterologist:  Dr. Yancey Flemings   Reason for Consultation:  Dysphagia   HPI: [name] is a 78 y.o. female.  See her multiple problems listed below.  Hx esoph stricture dilated in 2008, PTA meds do not list PPI etc. Chronic Coumadin with INR of 5.2 yesterday.  2 months of progressive dysphagia, restarted on Nexium once daily.  She upped dose to BID but med never helped the dysphagia.  4 days of near complete inablility to swallow, progressive weakness and oliguria.  No vomiting or nausea. Feels lousy.      Past Medical History  Diagnosis Date  . Benign neoplasm of colon   . Diverticulosis of colon (without mention of hemorrhage)   . Reflux esophagitis   . Gastric polyp     history of  . GERD (gastroesophageal reflux disease)   . Stricture and stenosis of esophagus   . Reactive airway disease   . Anemia, iron deficiency   . CHF (congestive heart failure)   . Restless leg syndrome   . Hypercholesterolemia   . Nonischemic cardiomyopathy     EF 20%  . Personal history of sudden cardiac death successfully resuscitated     status post cardioverter- defibrillator  . Atrial fibrillation   . Diabetes mellitus   . Osteoarthritis   . ICD (implantable cardiac defibrillator) in place   . Hypertension   . UTI (lower urinary tract infection)     Past Surgical History  Procedure Date  . Cardiac defibrillator placement 1999, 2001, 3.30.2012    [facility], Removal of previously implanted BiV ICD followed by insertion of BiV pacemaker with pacemaker pocket revision. without immediate procedural complications.   . Eye surgery 2002    cataract   . Total hip arthroplasty 2004    right  . Insert / replace / remove pacemaker   . Joint replacement     Prior to Admission medications   Medication  Sig Start Date End Date Taking? Authorizing Provider  amiodarone (PACERONE) 200 MG tablet Take one tablet by mouth twice daily. 02/14/11   Duke Salvia, MD  levothyroxine (SYNTHROID, LEVOTHROID) 100 MCG tablet Take 100 mcg by mouth daily.      Historical Provider, MD  Polyethyl Glycol-Propyl Glycol (SYSTANE ULTRA OP) Apply 3 drops to eye 3 (three) times daily.      Historical Provider, MD  Nexium  Dose not known     BID  Scheduled Meds:    . amiodarone  200 mg Oral Daily  . levothyroxine  100 mcg Oral Daily  . phytonadione  10 mg Oral Once   Infusions:    . 0.9 % NaCl with KCl 20 mEq / L 75 mL/hr at 01/29/12 2023   PRN Meds: acetaminophen, acetaminophen, bisacodyl, HYDROcodone-acetaminophen, ondansetron (ZOFRAN) IV, ondansetron, zolpidem   Allergies as of 01/29/2012 - Review Complete 10/14/2011  Allergen Reaction Noted  . Iodinated diagnostic agents  05/31/2010    History reviewed. No pertinent family history.  History   Social History  . Marital Status: Widowed    Spouse Name: N/A    Number of Children: N/A  . Years of Education: N/A   Occupational History  . Not on file.   Social History Main Topics  . Smoking status: Never Smoker   . Smokeless tobacco: Never Used  . Alcohol Use: 3.6 oz/week    6 Shots of  liquor per week     Comment: OCCASSIONAL  . Drug Use: No  . Sexually Active:    Other Topics Concern  . Not on file   Social History Narrative  . No narrative on file    REVIEW OF SYSTEMS: Constitutional:  Malaise, fatigue ENT:  No nose bleeds Pulm:  Cough is procuctive of clear phlegm CV:  No chest pain or palps GU:  Incontinence, urgency:  Not new. Decreased urine is new GI:  As per HPI.  Stable q 3 day BMs.  No abd pain Heme:  No need of iron replacements. .    Transfusions:  None ever Neuro:  No headache, no falls, no syncope Derm:  No rash or itching Endocrine:  She is thirsty.  No hx DM Immunization:  Flu shot up to date  Travel:   none   PHYSICAL EXAM: Vital signs in last 24 hours: Temp:  [97.5 F (36.4 C)-97.8 F (36.6 C)] 97.5 F (36.4 C) (11/19 0511) Pulse Rate:  [65-79] 70  (11/19 0511) Resp:  [18] 18  (11/19 0511) BP: (112-120)/(71-85) 118/71 mmHg (11/19 0511) SpO2:  [96 %-99 %] 96 % (11/19 0511) Weight:  [71.8 kg (158 lb 4.6 oz)] 71.8 kg (158 lb 4.6 oz) (11/18 1700)  General: looks unwell and frail Head:  No facial edema or trauma  Eyes:  No icterus or pallor Ears:  Not HOH  Nose:  No discharge Mouth:  Dry MM Neck:  No JVD or mass Lungs:  Clear B.  Some upper airway wheezes and increased insp effort, loose cough Heart: RRR.  No MRG Abdomen:  Soft, ND, NT, active BS.  No mass or HSM.   Rectal: not done   Musc/Skeltl: no joint erythema Extremities:  No pedal edema  Neurologic:  Pleasant, oriented x 3.  No tremor Skin:  No rash, no telangectasia Tattoos:  none Nodes:  No cervical adenopathy   Psych:  Pleasant, affect blunted, relaxed  Intake/Output from previous day: 11/18 0701 - 11/19 0700 In: 661.3 [I.V.:661.3] Out: -  Intake/Output this shift:    LAB RESULTS:  Basename 01/30/12 0550 01/29/12 1849  WBC 10.1 11.0*  HGB 11.1* 12.0  HCT 33.9* 36.2  PLT 190 210   BMET Lab Results  Component Value Date   NA 147* 01/30/2012   NA 144 01/29/2012   NA 139 10/17/2011   K 3.5 01/30/2012   K 3.6 01/29/2012   K 3.8 10/17/2011   CL 114* 01/30/2012   CL 107 01/29/2012   CL 103 10/17/2011   CO2 22 01/30/2012   CO2 27 01/29/2012   CO2 24 10/17/2011   GLUCOSE 95 01/30/2012   GLUCOSE 100* 01/29/2012   GLUCOSE 110* 10/17/2011   BUN 26* 01/30/2012   BUN 30* 01/29/2012   BUN 35* 10/17/2011   CREATININE 1.21* 01/30/2012   CREATININE 1.40* 01/29/2012   CREATININE 1.65* 10/17/2011   CALCIUM 8.4 01/30/2012   CALCIUM 9.0 01/29/2012   CALCIUM 8.9 10/17/2011   LFT  Basename 01/29/12 1849  PROT 6.1  ALBUMIN 2.7*  AST 25  ALT 35  ALKPHOS 175*  BILITOT 0.9  BILIDIR --  IBILI --   PT/INR Lab  Results  Component Value Date   INR 5.23* 01/29/2012    RADIOLOGY STUDIES: Abd 1 View (kub)  01/29/2012  *RADIOLOGY REPORT*  Clinical Data: Nausea, vomiting  ABDOMEN - 1 VIEW  Comparison: 04/30/2008  Findings: Right hip arthroplasty components partially seen.  There is a mild dextroscoliosis of the  lumbar spine with multilevel degenerative changes. Small bowel decompressed.  There is a normal distribution of gas and stool throughout the colon and distending the rectum.  Visualized lung bases clear.  Bilateral pelvic phleboliths.  IMPRESSION:  Nonobstructive bowel gas pattern   Original Report Authenticated By: D. Andria Rhein, MD     ENDOSCOPIC STUDIES: 02/2007   EGD   Marina Goodell Dilated esoph stricture, gastric polyps.  EGD 04/2004 No report found except pathology Pathology:  Chronic active gastritis with H Pylori organisms, hyper plastic polyp  04/2004  Colonoscopy  [place] Diverticulosis.  7 Polyps removed.  Pathology:  Adenomatous polyps, no HGD    IMPRESSION: *  Dysphagia.  2008 dilation of esoph stricture.  *  Hx gastric hyperplastic polyps and H pylori gastritis.   *  Hx colon adenomatous polyps 2008. *  Renal insufficiency, chronic.  Labs actulally better than during 10/2011 admission *  Chronic Coumadin for PAF, idiopathic dilated cardiomyopathy.  INR supratherapeutic. Vitamin K 10 mg po given this AM.  Pacemaker in place, ICD capability neutralilzed.  PLAN: *  EGD with esophageal dilatation.  Tomorrow 12:15 if coags are corrected *  Give Vit K IV .  Recheck INR in AM, if needed will give FFP to correct coags 01/31/12   LOS: 1 day   Jennye Moccasin  01/30/2012, 9:08 AM Pager: [phone]      ________________________________________________________________________  [facility] MD note:  I personally examined the patient, reviewed the data and agree with the assessment and plan described above.  Normally would like to proceed with EGD now, however her INR is 5.2 and given the  likely need to removed food, dilate I want that lower to ensure safe procedure.  She is keeping down saliva and so not complete obstruction.  Will reverse coumadin and plan on EGD.   Rob Bunting, MD [facility] Pager [phone]

## 2012-01-30 NOTE — Evaluation (Signed)
Physical Therapy Evaluation Patient Details Name: [name] MRN: [id number] DOB: January 05, 1927 Today's Date: 01/30/2012 Time: 7829-5621 PT Time Calculation (min): 20 min  PT Assessment / Plan / Recommendation Clinical Impression  Pt is a cooperative 78 y.o. female with weakness and deconditioning secondary to immobility.  Pt will benefit from continued skilled PT to improve strentgh, activity tolerance and maximize independence.    PT Assessment  Patient needs continued PT services    Follow Up Recommendations  Supervision/Assistance - 24 hour (If family cannnot provide then pt needs SNF pending progress)    Does the patient have the potential to tolerate intense rehabilitation      Barriers to Discharge Decreased caregiver support pt lives alone    Equipment Recommendations  None recommended by PT    Recommendations for Other Services     Frequency Min 3X/week    Precautions / Restrictions Restrictions Weight Bearing Restrictions: No   Pertinent Vitals/Pain BP assessed due to dizziness 127/98; HR 69; O2 98% on rm air      Mobility  Bed Mobility Bed Mobility: Rolling Left;Left Sidelying to Sit;Sit to Supine;Scooting to [facility] Rolling Left: 5: Supervision Left Sidelying to Sit: 5: Supervision Sit to Supine: 5: Supervision Scooting to [facility]: 2: Max assist Details for Bed Mobility Assistance: Pt unable to bridge and push body towards [facility] Transfers Transfers: Sit to Stand;Stand to Sit Sit to Stand: 4: Min assist Stand to Sit: 4: Min assist Details for Transfer Assistance: Hand held assist for stability and balance Ambulation/Gait Ambulation/Gait Assistance: Not tested (comment) (pt states + dizziness upon standing does not wish to walk)       Exercises General Exercises - Lower Extremity Ankle Circles/Pumps: Both;10 reps   PT Diagnosis: Difficulty walking;Abnormality of gait;Generalized weakness  PT Problem List: Decreased strength;Decreased activity  tolerance;Decreased balance;Decreased mobility;Decreased range of motion;Pain PT Treatment Interventions: DME instruction;Gait training;Stair training;Functional mobility training;Therapeutic activities;Therapeutic exercise;Balance training;Patient/family education   PT Goals Acute Rehab PT Goals PT Goal Formulation: With patient Time For Goal Achievement: 02/04/12 Potential to Achieve Goals: Good Pt will Stand: with modified independence;3 - 5 min PT Goal: Stand - Progress: Goal set today Pt will Ambulate: >150 feet;with modified independence;with rolling walker PT Goal: Ambulate - Progress: Goal set today Pt will Go Up / Down Stairs: 1-2 stairs;with modified independence;with rolling walker PT Goal: Up/Down Stairs - Progress: Goal set today  Visit Information  Last PT Received On: 01/30/12 Assistance Needed: +1    Subjective Data  Subjective: I am so tired and my mouth is so dry Patient Stated Goal: to have a lemonade   Prior Functioning  Home Living Lives With: Alone Available Help at Discharge: Family;Friend(s) (cleaning lady 4 days a wk) Type of Home: House Home Access: Stairs to enter [employer] of Steps: 1 Entrance Stairs-Rails: Right Home Layout: One level Bathroom Shower/Tub: Walk-in shower;Tub/shower unit Bathroom Toilet: Standard Home Adaptive Equipment: Environmental consultant - four wheeled Prior Function Level of Independence: Independent with assistive device(s) (with rollater) Able to Take Stairs?: Yes (difficult) Driving: No Vocation: Retired Musician:  (macular degeneration) Dominant Hand: Right    Cognition  Overall Cognitive Status: Appears within functional limits for tasks assessed/performed Arousal/Alertness: Awake/alert Orientation Level: Appears intact for tasks assessed;Oriented X4 / Intact Behavior During Session: [facility] for tasks performed    Extremity/Trunk Assessment Right Upper Extremity Assessment RUE ROM/Strength/Tone:  Deficits;Due to pain RUE ROM/Strength/Tone Deficits: pt very weak and painfull with active ROM; no pain with PROM Left Upper Extremity Assessment LUE ROM/Strength/Tone:  Deficits LUE ROM/Strength/Tone Deficits: pt very weak and painfull with active ROM; no pain with PROM Right Lower Extremity Assessment RLE ROM/Strength/Tone: WFL for tasks assessed (strength assessed ) Left Lower Extremity Assessment LLE ROM/Strength/Tone: WFL for tasks assessed (strength assessed)   Balance    End of Session PT - End of Session Equipment Utilized During Treatment: Gait belt Activity Tolerance: Patient limited by fatigue Patient left: in bed;with call bell/phone within reach Nurse Communication: Mobility status  GP     Fabio Asa 01/30/2012, 11:31 AM  Charlotte Crumb, PT DPT  [phone]

## 2012-01-30 NOTE — Progress Notes (Addendum)
CRITICAL VALUE ALERT  Critical value received:  INR 5.23  Date of notification: 01/29/12  Time of notification:  2000  Critical value read back:yes  Nurse who received alert:  Francesca Jewett  MD notified (1st page):  Dr. Wylene Simmer  Time of first page:  2004     MD notified (2nd page):  Time of second page:  Responding MD:  Dr. Wylene Simmer  Time MD responded:  2015  No new orders given.

## 2012-01-30 NOTE — Progress Notes (Signed)
Subjective: Still feels very tired, has dry mouth, no worsening of dyspnea. Has chronic low back pain that is aggravated with bedrest.  Objective: Vital signs in last 24 hours: Temp:  [97.5 F (36.4 C)-97.8 F (36.6 C)] 97.5 F (36.4 C) (11/19 0511) Pulse Rate:  [65-79] 70  (11/19 0511) Resp:  [18] 18  (11/19 0511) BP: (112-120)/(71-85) 118/71 mmHg (11/19 0511) SpO2:  [96 %-99 %] 96 % (11/19 0511) Weight:  [71.8 kg (158 lb 4.6 oz)] 71.8 kg (158 lb 4.6 oz) (11/18 1700) Weight change:    Intake/Output from previous day: 11/18 0701 - 11/19 0700 In: 661.3 [I.V.:661.3] Out: -    General appearance: alert, cooperative and no distress Resp: clear to auscultation bilaterally Cardio: regular rate and rhythm GI: soft, non-tender; bowel sounds normal; no masses,  no organomegaly Extremities: extremities normal, atraumatic, no cyanosis or edema  Lab Results:  [facility] 01/30/12 0550 01/29/12 1849  WBC 10.1 11.0*  HGB 11.1* 12.0  HCT 33.9* 36.2  PLT 190 210   BMET  Basename 01/30/12 0550 01/29/12 1849  NA 147* 144  K 3.5 3.6  CL 114* 107  CO2 22 27  GLUCOSE 95 100*  BUN 26* 30*  CREATININE 1.21* 1.40*  CALCIUM 8.4 9.0   CMET CMP     Component Value Date/Time   NA 147* 01/30/2012 0550   K 3.5 01/30/2012 0550   CL 114* 01/30/2012 0550   CO2 22 01/30/2012 0550   GLUCOSE 95 01/30/2012 0550   BUN 26* 01/30/2012 0550   CREATININE 1.21* 01/30/2012 0550   CALCIUM 8.4 01/30/2012 0550   PROT 6.1 01/29/2012 1849   ALBUMIN 2.7* 01/29/2012 1849   AST 25 01/29/2012 1849   ALT 35 01/29/2012 1849   ALKPHOS 175* 01/29/2012 1849   BILITOT 0.9 01/29/2012 1849   GFRNONAA 40* 01/30/2012 0550   GFRAA 47* 01/30/2012 0550    CBG (last 3)   Basename 01/30/12 0804 01/29/12 1708  GLUCAP 102* 111*    INR RESULTS:   Lab Results  Component Value Date   INR 5.23* 01/29/2012   INR 2.13* 10/17/2011   INR 1.82* 10/16/2011     Studies/Results: Abd 1 View (kub)  01/29/2012   *RADIOLOGY REPORT*  Clinical Data: Nausea, vomiting  ABDOMEN - 1 VIEW  Comparison: 04/30/2008  Findings: Right hip arthroplasty components partially seen.  There is a mild dextroscoliosis of the lumbar spine with multilevel degenerative changes. Small bowel decompressed.  There is a normal distribution of gas and stool throughout the colon and distending the rectum.  Visualized lung bases clear.  Bilateral pelvic phleboliths.  IMPRESSION:  Nonobstructive bowel gas pattern   Original Report Authenticated By: D. Andria Rhein, MD     Medications: I have reviewed the patient's current medications.  Assessment/Plan: #1 Dysphagia: severe and likely due to high grade esophageal obstruction. Last night I discussed her case with [facility] who plan on consultation today. #2 Coumadin Anticoagulation: INR too high for procedure, so will give vitamin K and recheck INR this evening. #3 Dilated Cardiomyopathy: stable on IVF, will recheck BNP tomorrow am.   LOS: 1 day   [name] G 01/30/2012, 8:52 AM

## 2012-01-31 ENCOUNTER — Encounter (HOSPITAL_COMMUNITY)
Admission: AD | Disposition: A | Discharge status: Skilled Nursing Facility | Payer: Self-pay | Source: Ambulatory Visit | Attending: Internal Medicine

## 2012-01-31 ENCOUNTER — Encounter (HOSPITAL_COMMUNITY): Payer: Self-pay | Admitting: Gastroenterology

## 2012-01-31 ENCOUNTER — Inpatient Hospital Stay (HOSPITAL_COMMUNITY): Payer: Medicare Other

## 2012-01-31 DIAGNOSIS — R1310 Dysphagia, unspecified: Secondary | ICD-10-CM | POA: Diagnosis present

## 2012-01-31 DIAGNOSIS — I509 Heart failure, unspecified: Secondary | ICD-10-CM | POA: Diagnosis not present

## 2012-01-31 DIAGNOSIS — J9600 Acute respiratory failure, unspecified whether with hypoxia or hypercapnia: Secondary | ICD-10-CM

## 2012-01-31 DIAGNOSIS — I5023 Acute on chronic systolic (congestive) heart failure: Secondary | ICD-10-CM

## 2012-01-31 HISTORY — PX: ESOPHAGOGASTRODUODENOSCOPY (EGD) WITH ESOPHAGEAL DILATION: SHX5812

## 2012-01-31 LAB — CBC
Platelets: 207 10*3/uL (ref 150–400)
RBC: 3.62 MIL/uL — ABNORMAL LOW (ref 3.87–5.11)
RDW: 15.1 % (ref 11.5–15.5)
WBC: 13.1 10*3/uL — ABNORMAL HIGH (ref 4.0–10.5)

## 2012-01-31 LAB — BASIC METABOLIC PANEL
CO2: 23 mEq/L (ref 19–32)
Chloride: 109 mEq/L (ref 96–112)
Sodium: 146 mEq/L — ABNORMAL HIGH (ref 135–145)

## 2012-01-31 LAB — URINE CULTURE: Colony Count: >=100000

## 2012-01-31 LAB — GLUCOSE, CAPILLARY: Glucose-Capillary: 95 mg/dL (ref 70–99)

## 2012-01-31 LAB — PROTIME-INR: INR: 3.01 — ABNORMAL HIGH (ref 0.00–1.49)

## 2012-01-31 LAB — MRSA PCR SCREENING: MRSA by PCR: NEGATIVE

## 2012-01-31 SURGERY — ESOPHAGOGASTRODUODENOSCOPY (EGD) WITH ESOPHAGEAL DILATION
Anesthesia: Moderate Sedation

## 2012-01-31 SURGERY — EGD (ESOPHAGOGASTRODUODENOSCOPY)
Anesthesia: Moderate Sedation

## 2012-01-31 MED ORDER — VITAMIN K1 10 MG/ML IJ SOLN
5.0000 mg | Freq: Once | INTRAVENOUS | Status: AC
  Administered 2012-01-31: 5 mg via INTRAVENOUS
  Filled 2012-01-31: qty 0.5

## 2012-01-31 MED ORDER — DEXTROSE 5 % IV SOLN
1.0000 g | INTRAVENOUS | Status: DC
  Administered 2012-01-31 – 2012-02-06 (×6): 1 g via INTRAVENOUS
  Filled 2012-01-31 (×9): qty 10

## 2012-01-31 MED ORDER — MORPHINE SULFATE 4 MG/ML IJ SOLN
INTRAMUSCULAR | Status: AC
  Filled 2012-01-31: qty 1

## 2012-01-31 MED ORDER — SODIUM CHLORIDE 0.9 % IV SOLN
INTRAVENOUS | Status: DC
  Administered 2012-01-31: 250 mL via INTRAVENOUS

## 2012-01-31 MED ORDER — FUROSEMIDE 10 MG/ML IJ SOLN
40.0000 mg | Freq: Two times a day (BID) | INTRAMUSCULAR | Status: DC
  Administered 2012-01-31 – 2012-02-02 (×4): 40 mg via INTRAVENOUS
  Filled 2012-01-31 (×6): qty 4

## 2012-01-31 MED ORDER — MORPHINE SULFATE 4 MG/ML IJ SOLN
1.0000 mg | INTRAMUSCULAR | Status: DC | PRN
  Administered 2012-01-31 – 2012-02-06 (×2): 1 mg via INTRAVENOUS
  Filled 2012-01-31: qty 1

## 2012-01-31 MED ORDER — FUROSEMIDE 10 MG/ML IJ SOLN
INTRAMUSCULAR | Status: AC
  Filled 2012-01-31: qty 4

## 2012-01-31 NOTE — Progress Notes (Signed)
Subjective: Dyspnea from yesterday has resolved with IV lasix, mouth is dry, is comfortable lying at 10 elevation of HOB today on [place] oxygen Objective: Vital signs in last 24 hours: Temp:  [97.3 F (36.3 C)-97.6 F (36.4 C)] 97.3 F (36.3 C) (11/20 0503) Pulse Rate:  [69-71] 69  (11/20 0503) Resp:  [18-20] 19  (11/20 0503) BP: (123-149)/(70-96) 135/76 mmHg (11/20 0503) SpO2:  [98 %-100 %] 100 % (11/20 0503) Weight change:    Intake/Output from previous day: 11/19 0701 - 11/20 0700 In: 440.3 [I.V.:440.3] Out: 250 [Urine:250]   General appearance: alert, cooperative and no distress Resp: clear to auscultation bilaterally Cardio: regular rate and rhythm GI: soft, non-tender; bowel sounds normal; no masses,  no organomegaly Extremities: extremities normal, atraumatic, no cyanosis or edema  Lab Results:  [facility] 01/31/12 0605 01/30/12 0550  WBC 13.1* 10.1  HGB 12.0 11.1*  HCT 36.0 33.9*  PLT 207 190   BMET  Basename 01/30/12 0550 01/29/12 1849  NA 147* 144  K 3.5 3.6  CL 114* 107  CO2 22 27  GLUCOSE 95 100*  BUN 26* 30*  CREATININE 1.21* 1.40*  CALCIUM 8.4 9.0   CMET CMP     Component Value Date/Time   NA 147* 01/30/2012 0550   K 3.5 01/30/2012 0550   CL 114* 01/30/2012 0550   CO2 22 01/30/2012 0550   GLUCOSE 95 01/30/2012 0550   BUN 26* 01/30/2012 0550   CREATININE 1.21* 01/30/2012 0550   CALCIUM 8.4 01/30/2012 0550   PROT 6.1 01/29/2012 1849   ALBUMIN 2.7* 01/29/2012 1849   AST 25 01/29/2012 1849   ALT 35 01/29/2012 1849   ALKPHOS 175* 01/29/2012 1849   BILITOT 0.9 01/29/2012 1849   GFRNONAA 40* 01/30/2012 0550   GFRAA 47* 01/30/2012 0550    CBG (last 3)   Basename 01/31/12 0754 01/30/12 0804 01/29/12 1708  GLUCAP 95 102* 111*    INR RESULTS:   Lab Results  Component Value Date   INR 3.01* 01/31/2012   INR 4.62* 01/30/2012   INR 5.23* 01/29/2012     Studies/Results: Abd 1 View (kub)  01/29/2012  *RADIOLOGY REPORT*  Clinical Data:  Nausea, vomiting  ABDOMEN - 1 VIEW  Comparison: 04/30/2008  Findings: Right hip arthroplasty components partially seen.  There is a mild dextroscoliosis of the lumbar spine with multilevel degenerative changes. Small bowel decompressed.  There is a normal distribution of gas and stool throughout the colon and distending the rectum.  Visualized lung bases clear.  Bilateral pelvic phleboliths.  IMPRESSION:  Nonobstructive bowel gas pattern   Original Report Authenticated By: D. Andria Rhein, MD     Medications: I have reviewed the patient's current medications.  Assessment/Plan: #1 CHF: from acute on chronic systolic heart failure and improved after lasix #2 Anticoagulation: INR improved and will give vitamin K 5mg  IV now prior to possible endoscopy later today  LOS: 2 days   Deo Mehringer G 01/31/2012, 8:14 AM

## 2012-01-31 NOTE — Progress Notes (Signed)
Patient continue difficulty breathing ,rapid response nurse called for consultation

## 2012-01-31 NOTE — Significant Event (Addendum)
Rapid Response Event Note Called to ENDO lab to assist with patient in resp distress.  Patient from 6N  OverviewTime Called: 1245 Arrival Time: 1250 Event Type: Respiratory  Initial Focused Assessment:      On arrival patient alert-sitting up in bed - skin hot and moist - pale - oriented and able to speak in full sentences.  RR 36-38 - moderate distress with some accessory muscle use - bil BS present - with rales in all fields - has EF hx 20% - has finished one bag of FFP and second one hanging. Some  JVD present.  BP 156/69.  O2 sat 93% on 3 liter nasal cannula.  Patient denies chest pain - c/o only back pain which she states is chronic.  Positive for SOB - she states I had episode like this yesterday.  1+ edema noted in ankles.  Abdomen soft.  ENDO cancelled.  Dr. Christella Hartigan spoke with Dr. Eloise Harman.   Interventions:       Attempted NRB mask- patient unable to tolerate -" smothering. "  Placed back on nasal cannula. 1305:   Dr. Eloise Harman returned page update given - requested PCXR and Lasix - order for 40 mg IV Lasix given - also to insert foley. Stopped FFP per order Dr. Eloise Harman.  ENDO staff to call BB re: FFP.   Requested SDU bed.  Patient informing me that she is a DNR and wants that request honored.  Patient reassured.  1315:  40 mg Lasix IV given.  #16 foley catheter inserted with scant amount dark amber urine. 166/110 89 RR 38 O2 sat 92% 1345: Patient becoming more tachypnic and now having trouble speaking in full sentences - states I am tired.  Discussed BIPAP with patient - called for machine. BP 166/110 RR 48-52 HR 89 O2 sat 90%  Dr. Eloise Harman called an updated with events - order to start BIPAP - requested MSO4 - 1 mg IV Morphine given and BiPap started per RT -see RT note.  Coached patient thru first five minutes - she began to relax with decreased WOB.  1400:  145/89 HR 80 RR 24 O2 sat 96%.  Patient resting = NAD.  Bil BS more clear - few rales in bases noted.  Foley with only 45 cc urine noted.   1415:  142/87  HR 88 RR 24:  Report called to [employer] on 2900 by Marisue Ivan RN from [employer] - updated given to [place] by myself.  Transported to 2920 with BiPap - tol well.  Family updated by Emiliano Dyer.         Event Summary: Name of Physician Notified: Dr. Jarome Matin at 1300 (PTA also per Dr. Christella Hartigan)    at    Outcome: Transferred (Comment);Code status clarified  2920.     [name]

## 2012-01-31 NOTE — Progress Notes (Signed)
In admitting in Endo, patient was clearly tachypic, dyspnc.  Satting low 90s on 3liters.  Rales throughout lungs.  She also has not completed 2nd unit ffp yet (just hung in endo).  She is not stable for endoscopy currently, due to respiratory issues.  I discussed this with Dr. Eloise Harman who is planning on consulting cardiology (has baseline low EF).  Again, I don't think she has complete esophageal obstruction but she is very dysphagic and needs EGD evaluation expeiditiously but now it is not safe.

## 2012-01-31 NOTE — Progress Notes (Signed)
Patient with decreased WOB, speaking in full sentences on BIPAP to daughter,wanting to eat ice, BIPAP removed per verbal okay of cardiology and respiratory therapy, placed on 3 LPM nasal cannula with good sat 98-100% and respiratory rate of 18, daughter at bedside, [place] suction setup and patient instructed for self use if she is unable to swallow water from ice, Berle Mull RN

## 2012-01-31 NOTE — Progress Notes (Signed)
Called report to [place], RN on 2900. Voicemail left for daughter to call me back when she can for an update.

## 2012-01-31 NOTE — Interval H&P Note (Signed)
History and Physical Interval Note:  01/31/2012 12:22 PM  [name]  has presented today for surgery, with the diagnosis of dysphagia  The various methods of treatment have been discussed with the patient and family. After consideration of risks, benefits and other options for treatment, the patient has consented to  Procedure(s) (LRB) with comments: ESOPHAGOGASTRODUODENOSCOPY (EGD) WITH ESOPHAGEAL DILATION (N/A) as a surgical intervention .  The patient's history has been reviewed, patient examined, no change in status, stable for surgery.  I have reviewed the patient's chart and labs.  Questions were answered to the patient's satisfaction.     Rob Bunting

## 2012-01-31 NOTE — Progress Notes (Signed)
     [name] Daily Rounding Note 01/31/2012, 8:29 AM  SUBJECTIVE:       Dyspnea better after Lasix yesterday.   OBJECTIVE:         Vital signs in last 24 hours:    Temp:  [97.3 F (36.3 C)-97.6 F (36.4 C)] 97.3 F (36.3 C) (11/20 0503) Pulse Rate:  [69-71] 69  (11/20 0503) Resp:  [18-20] 19  (11/20 0503) BP: (123-149)/(70-96) 135/76 mmHg (11/20 0503) SpO2:  [98 %-100 %] 100 % (11/20 0503) Last BM Date: 01/29/12 Did not reexamine.   Intake/Output from previous day: 11/19 0701 - 11/20 0700 In: 440.3 [I.V.:440.3] Out: 250 [Urine:250]  Intake/Output this shift:    Lab Results:  Basename 01/31/12 0605 01/30/12 0550 01/29/12 1849  WBC 13.1* 10.1 11.0*  HGB 12.0 11.1* 12.0  HCT 36.0 33.9* 36.2  PLT 207 190 210   BMET  Basename 01/30/12 0550 01/29/12 1849  NA 147* 144  K 3.5 3.6  CL 114* 107  CO2 22 27  GLUCOSE 95 100*  BUN 26* 30*  CREATININE 1.21* 1.40*  CALCIUM 8.4 9.0   LFT  Basename 01/29/12 1849  PROT 6.1  ALBUMIN 2.7*  AST 25  ALT 35  ALKPHOS 175*  BILITOT 0.9  BILIDIR --  IBILI --   PT/INR  Basename 01/31/12 0605 01/30/12 1724  LABPROT 29.6* 40.7*  INR 3.01* 4.62*    Studies/Results: Abd 1 View (kub) 01/29/2012  *RADIOLOGY REPORT*  Clinical Data: Nausea, vomiting  ABDOMEN - 1 VIEW  Comparison: 04/30/2008  Findings: Right hip arthroplasty components partially seen.  There is a mild dextroscoliosis of the lumbar spine with multilevel degenerative changes. Small bowel decompressed.  There is a normal distribution of gas and stool throughout the colon and distending the rectum.  Visualized lung bases clear.  Bilateral pelvic phleboliths.  IMPRESSION:  Nonobstructive bowel gas pattern   Original Report Authenticated By: D. Hassell III, MD     ASSESMENT: * Dysphagia. 2008 dilation of esoph stricture.  * Hx gastric hyperplastic polyps and H pylori gastritis.  * Hx colon adenomatous polyps 2008.  * Renal insufficiency, chronic.  Improved.  *  Chronic Coumadin for PAF, idiopathic dilated cardiomyopathy. INR supratherapeutic. Coags still  Elevated despite Vitamin K    PLAN: *  Give FFP to allow for EGD/dilt today..    LOS: 2 days   [name]  01/31/2012, 8:29 AM Pager: [phone]    ________________________________________________________________________  [facility] MD note:  I reviewed the data and agree with the assessment and plan described above.  Will plan FFP immediately so that we may safely proceed with EGD today.   Kristen Grennan, MD [facility] Pager [phone]  

## 2012-01-31 NOTE — Clinical Social Work Psychosocial (Addendum)
    Clinical Social Work Department BRIEF PSYCHOSOCIAL ASSESSMENT 01/31/2012  Patient:  [name], [name]     Account Number:  [id number]     Admit date:  01/29/2012  Clinical Social Worker:  Hulan Fray  Date/Time:  01/31/2012 10:49 AM  Referred by:  RN  Date Referred:  01/29/2012 Referred for  ALF Placement   Other Referral:   Interview type:  Patient Other interview type:    PSYCHOSOCIAL DATA Living Status:  ALONE Admitted from facility:   Level of care:   Primary support name:  [name] Primary support relationship to patient:  CHILD, ADULT Degree of support available:   vested and supportive    CURRENT CONCERNS Current Concerns  Post-Acute Placement   Other Concerns:    SOCIAL WORK ASSESSMENT / PLAN Clinical Social Worker received referral for ALF placement. CSW introduced self and explained reason for visit. Patient reported that her daughter, [name] is meeting with [facility] Friday and have already sent them information regarding patient. CSW provided patient with a list of Assisted living facilities in [place] for additional resources. CSW will continue to follow.   Assessment/plan status:  Psychosocial Support/Ongoing Assessment of Needs Other assessment/ plan:   02-01-12 CSW spoke with [name] at [facility] regarding possible admission and she reported that the patient's daughter is scheduled to meet with [name] at the facility Friday (02-02-12). [name] requested CSW fax [phone]) FL2 to [facility].  Information/referral to community resources:   ALF list    PATIENT'S/FAMILY'S RESPONSE TO PLAN OF CARE: Patient reported that daughter is meeting with [facility] regarding placement on Friday.

## 2012-01-31 NOTE — Consult Note (Signed)
Cardiology Consult Note   Patient ID: [name] MRN: [id number], DOB/AGE: Mar 21, 1926   Admit date: 01/29/2012 Date of Consult: 01/31/2012  Primary Physician: Garlan Fillers, MD Primary Cardiologist: Previosly Dr. Deborah Chalk; Dr. Ladona Ridgel (EP)  Reason for consult: evaluation/management of CHF  HPI: [name] is a 78yo female with PMHx s/f idiopathic dilated cardiomyopathy (11/12 echo: LVEF 20-25%, diffuse HK, moderate MR, severe LAE, PFO, mod TR, PASP ), PAF (on chronic Coumadin), symptomatic bradycardia (s/p CRT-D->CRT-P revision 03/12), CKD (stage III) and history of esophageal stricture (s/p dilation 12/08) who was admitted on 11/18 w/ acute esophageal obstruction.   She endorsed dysphagia and regurgitation of food and fluids x 3-4 days. This has worsened over the course of a few weeks. She notes associated weakness and limited activity. There is a sensation of esophageal obstruction. She has had trouble taking pills. As a result, she was admitted by her PCP for expeditious GI eval. GI was consulted and plan made for EGD. INR was found to be supratherapeutic >5.0 on admission. This was reversed with vitamin K and FFP (INR 3.01). She had two dyspneic spells yesterday and today, just prior to EGD, attributed to pulmonary edema. pBNP elevated ~ 16000 on 11/18. EGD was cancelled. Lasix was given and BiPAP initiated with some improvement. Cardiology has been consulted for further evaluation/management. BMET unremarkable. CBC with mild WBC elevation today (13.1). U/A revealed UTI for which Rocephin was started.   Problem List: Past Medical History  Diagnosis Date  . Benign neoplasm of colon   . Diverticulosis of colon (without mention of hemorrhage)   . Reflux esophagitis   . Gastric polyp     history of  . GERD (gastroesophageal reflux disease)   . Stricture and stenosis of esophagus   . Reactive airway disease   . Anemia, iron deficiency   . CHF (congestive heart failure)   .  Restless leg syndrome   . Hypercholesterolemia   . Nonischemic cardiomyopathy     EF 20%  . Personal history of sudden cardiac death successfully resuscitated     status post cardioverter- defibrillator  . Atrial fibrillation   . Diabetes mellitus   . Osteoarthritis   . ICD (implantable cardiac defibrillator) in place   . Hypertension   . UTI (lower urinary tract infection)     Past Surgical History  Procedure Date  . Cardiac defibrillator placement 1999, 2001, 3.30.2012    [facility] [facility], Removal of previously implanted BiV ICD followed by insertion of BiV pacemaker with pacemaker pocket revision. without immediate procedural complications.   . Eye surgery 2002    cataract   . Total hip arthroplasty 2004    right  . Insert / replace / remove pacemaker   . Joint replacement      Allergies:  Allergies  Allergen Reactions  . Iodinated Diagnostic Agents     Unknown    Home Medications: Prior to Admission medications   Medication Sig Start Date End Date Taking? Authorizing Provider  acetaminophen (TYLENOL) 325 MG tablet Take 650 mg by mouth every 6 (six) hours as needed. For pain/fever   Yes Historical Provider, MD  amiodarone (PACERONE) 200 MG tablet Take one tablet by mouth twice daily. 02/14/11  Yes Duke Salvia, MD  azelastine (OPTIVAR) 0.05 % ophthalmic solution Place 1 drop into both eyes 2 (two) times daily.   Yes Historical Provider, MD  bisacodyl (DULCOLAX) 5 MG EC tablet Take 5-10 mg by mouth daily as needed. For constipation  Yes Historical Provider, MD  desloratadine (CLARINEX) 5 MG tablet Take 5 mg by mouth daily.   Yes Historical Provider, MD  esomeprazole (NEXIUM) 40 MG capsule Take 40 mg by mouth daily before breakfast.   Yes Historical Provider, MD  fish oil-omega-3 fatty acids 1000 MG capsule Take 2 g by mouth daily.   Yes Historical Provider, MD  furosemide (LASIX) 40 MG tablet Take 40 mg by mouth daily.   Yes Historical Provider, MD  irbesartan (AVAPRO)  75 MG tablet Take 75 mg by mouth at bedtime.   Yes Historical Provider, MD  levothyroxine (SYNTHROID, LEVOTHROID) 100 MCG tablet Take 100 mcg by mouth daily.   Yes Historical Provider, MD  lipase/protease/amylase (CREON-10/PANCREASE) 12000 UNITS CPEP Take 1 capsule by mouth 3 (three) times daily before meals.   Yes Historical Provider, MD  montelukast (SINGULAIR) 10 MG tablet Take 10 mg by mouth at bedtime.   Yes Historical Provider, MD  Polyethyl Glycol-Propyl Glycol (SYSTANE ULTRA OP) Apply 3 drops to eye 3 (three) times daily.   Yes Historical Provider, MD  spironolactone (ALDACTONE) 25 MG tablet Take 25 mg by mouth daily.   Yes Historical Provider, MD  Vitamin D, Ergocalciferol, (DRISDOL) 50000 UNITS CAPS Take 50,000 Units by mouth every 7 (seven) days.   Yes Historical Provider, MD  warfarin (COUMADIN) 2 MG tablet Take 2-4 mg by mouth daily. Takes 4 mg on Thursday and 2 mg all other days   Yes Historical Provider, MD    Inpatient Medications:     . amiodarone  200 mg Oral Daily  . cefTRIAXone (ROCEPHIN)  IV  1 g Intravenous Q24H  . [COMPLETED] furosemide  40 mg Intravenous Once  . furosemide  40 mg Intravenous Q12H  . levothyroxine  100 mcg Oral Daily  . [COMPLETED] phytonadione (VITAMIN K) IV  5 mg Intravenous Once  . [DISCONTINUED] phytonadione (VITAMIN K) IV  5 mg Intravenous Once   Prescriptions prior to admission  Medication Sig Dispense Refill  . acetaminophen (TYLENOL) 325 MG tablet Take 650 mg by mouth every 6 (six) hours as needed. For pain/fever      . amiodarone (PACERONE) 200 MG tablet Take one tablet by mouth twice daily.  180 tablet  3  . azelastine (OPTIVAR) 0.05 % ophthalmic solution Place 1 drop into both eyes 2 (two) times daily.      . bisacodyl (DULCOLAX) 5 MG EC tablet Take 5-10 mg by mouth daily as needed. For constipation      . desloratadine (CLARINEX) 5 MG tablet Take 5 mg by mouth daily.      Marland Kitchen esomeprazole (NEXIUM) 40 MG capsule Take 40 mg by mouth daily  before breakfast.      . fish oil-omega-3 fatty acids 1000 MG capsule Take 2 g by mouth daily.      . furosemide (LASIX) 40 MG tablet Take 40 mg by mouth daily.      . irbesartan (AVAPRO) 75 MG tablet Take 75 mg by mouth at bedtime.      Marland Kitchen levothyroxine (SYNTHROID, LEVOTHROID) 100 MCG tablet Take 100 mcg by mouth daily.      . lipase/protease/amylase (CREON-10/PANCREASE) 12000 UNITS CPEP Take 1 capsule by mouth 3 (three) times daily before meals.      . montelukast (SINGULAIR) 10 MG tablet Take 10 mg by mouth at bedtime.      Bertram Gala Glycol-Propyl Glycol (SYSTANE ULTRA OP) Apply 3 drops to eye 3 (three) times daily.      Marland Kitchen spironolactone (  ALDACTONE) 25 MG tablet Take 25 mg by mouth daily.      . Vitamin D, Ergocalciferol, (DRISDOL) 50000 UNITS CAPS Take 50,000 Units by mouth every 7 (seven) days.      Marland Kitchen warfarin (COUMADIN) 2 MG tablet Take 2-4 mg by mouth daily. Takes 4 mg on Thursday and 2 mg all other days      . [DISCONTINUED] acetaminophen (TYLENOL) 325 MG tablet Take 650 mg by mouth every 6 (six) hours as needed. For pain      . [DISCONTINUED] azelastine (OPTIVAR) 0.05 % ophthalmic solution Place 1 drop into both eyes 2 (two) times daily.        . [DISCONTINUED] desloratadine (CLARINEX) 5 MG tablet Take 5 mg by mouth daily.        . [DISCONTINUED] fish oil-omega-3 fatty acids 1000 MG capsule Take 2 g by mouth daily.        . [DISCONTINUED] furosemide (LASIX) 40 MG tablet Take 40-80 mg by mouth 2 (two) times daily. Take 80 mg in the morning and 40 mg in the evening      . [DISCONTINUED] irbesartan (AVAPRO) 75 MG tablet Take 75 mg by mouth at bedtime.        . [DISCONTINUED] montelukast (SINGULAIR) 10 MG tablet Take 10 mg by mouth at bedtime.        . [DISCONTINUED] Pancrelipase, Lip-Prot-Amyl, (CREON) 12000 UNITS CPEP Take 1 capsule by mouth 3 (three) times daily before meals.       . [DISCONTINUED] spironolactone (ALDACTONE) 25 MG tablet Take 25 mg by mouth daily.        . [DISCONTINUED]  warfarin (COUMADIN) 4 MG tablet Take 2-4 mg by mouth daily. Take 2 mg except on Thursday take 4 mg        History reviewed. No pertinent family history.   History   Social History  . Marital Status: Widowed    Spouse Name: N/A    Number of Children: N/A  . Years of Education: N/A   Occupational History  . Not on file.   Social History Main Topics  . Smoking status: Never Smoker   . Smokeless tobacco: Never Used  . Alcohol Use: 3.6 oz/week    6 Shots of liquor per week     Comment: OCCASSIONAL  . Drug Use: No  . Sexually Active:    Other Topics Concern  . Not on file   Social History Narrative  . No narrative on file     Review of Systems: General: negative for chills, fever, night sweats or weight changes.  Cardiovascular: positive for chest pain, dyspnea on exertion, palpitations, SOB, negative for edema, orthopnea, palpitations, paroxysmal nocturnal dyspnea Dermatological: negative for rash Respiratory: negative for cough or wheezing Urologic: negative for hematuria Abdominal: negative for nausea, vomiting, diarrhea, bright red blood per rectum, melena, or hematemesis Neurologic: negative for visual changes, syncope, or dizziness All other systems reviewed and are otherwise negative except as noted above.  Physical Exam: Blood pressure 129/78, pulse 87, temperature 98.1 F (36.7 C), temperature source Axillary, resp. rate 25, height 5\' 2"  (1.575 m), weight 72.4 kg (159 lb 9.8 oz), SpO2 97.00%.  General: Well developed, well nourished, in no acute distress. Head: Normocephalic, atraumatic, sclera non-icteric, no xanthomas, nares are without discharge. Neck:  Negative for carotid bruits. JVD not elevated. Lungs: Bibasilar rales appreciated, no wheezing or rhonchi. Breathing assisted by BiPAP. Heart:  RRR with S1 S2. No murmurs, rubs, or gallops appreciated. Abdomen: Soft, non-tender, non-distended with  normoactive bowel sounds. No hepatomegaly. No rebound/guarding.  No obvious abdominal masses. Msk:   Strength and tone appears normal for age. Extremities: No clubbing, cyanosis or edema.  Distal pedal pulses are 2+ and equal bilaterally. Neuro: Alert and oriented X 3. Moves all extremities spontaneously. Psych:  Responds to questions appropriately with a normal affect.  Labs: Recent Labs  [facility] 01/31/12 0605 01/30/12 0550   WBC 13.1* 10.1   HGB 12.0 11.1*   HCT 36.0 33.9*   MCV 99.4 98.8   PLT 207 190   Lab 01/30/12 0550 01/29/12 1849  NA 147* 144  K 3.5 3.6  CL 114* 107  CO2 22 27  BUN 26* 30*  CREATININE 1.21* 1.40*  CALCIUM 8.4 9.0  PROT -- 6.1  BILITOT -- 0.9  ALKPHOS -- 175*  ALT -- 35  AST -- 25  AMYLASE -- --  LIPASE -- --  GLUCOSE 95 100*   Radiology/Studies: Abd 1 View (kub)  01/29/2012  *RADIOLOGY REPORT*  Clinical Data: Nausea, vomiting  ABDOMEN - 1 VIEW  Comparison: 04/30/2008  Findings: Right hip arthroplasty components partially seen.  There is a mild dextroscoliosis of the lumbar spine with multilevel degenerative changes. Small bowel decompressed.  There is a normal distribution of gas and stool throughout the colon and distending the rectum.  Visualized lung bases clear.  Bilateral pelvic phleboliths.  IMPRESSION:  Nonobstructive bowel gas pattern   Original Report Authenticated By: D. Andria Rhein, MD    Tele: A-sensed, V-paced ->A-V paced with intermittent NSVT, PVCs; no evidence of a-fib  ASSESSMENT AND PLAN:   1. Acute respiratory distress- secondary to #3. Given Lasix IV. With moderate output. Breathing improved on BiPAP.   2. Acute esophageal obstruction- as above, plan for EGD today, however developed respiratory distress in the setting of acute pulmonary edema while receiving a second bag of FFP today. INR 3.01. Currently being held.   3. Acute on chronic systolic CHF- dyspneic episode yesterday after FFP administration, and today during infusion. Lasix IV given and started on BiPAP. Patient has known  idiopathic dilated cardiomyopathy. Decompensation likely multifactorial in the setting of esophageal obstruction, UTI and FFP administration. Continue Lasix 40mg  IV BID. Monitor UOP (Foley in) and daily weights. Breathing much improved on BiPAP. Will get repeat echo. Check pCXR. Check BMET today. Limit IVF. Once tolerating PO and if renal function stable, continue ARB, ald antagonist. Would add BB.   3. PAF- no evidence of a-fib. Continue amiodarone. Continue holding Coumadin. Add heparin should INR drop <2.0.  4. Symptomatic bradycardia s/p CRT-P- stable. Normal functionality by 06/13 interrogation and telemetry this admission.  5. UTI- on admission u/a. Continue Rocephin.  6. Hypothyroidism- continue Synthroid.   7. CKD, stage III- will continue to monitor in an effort to avoid over-diuresis.    Signed, R. Hurman Horn, PA-C 01/31/2012, 4:17 PM The patient was seen in the step down unit with Mr. Francee Gentile.  At the present time the patient is resting comfortably with her dyspnea much improved.  She has had a good initial response to the IV Lasix given earlier.  On exam her lungs show only minimal basilar rales on the right.  Will continue Lasix 40 mg IV and follow her closely.  Two-dimensional echocardiogram is pending.  Daily BMET to follow renal function closely and avoid overdiuresis.  Agree with assessment and plan as noted above

## 2012-01-31 NOTE — H&P (View-Only) (Signed)
     [facility] Daily Rounding Note 01/31/2012, 8:29 AM  SUBJECTIVE:       Dyspnea better after Lasix yesterday.   OBJECTIVE:         Vital signs in last 24 hours:    Temp:  [97.3 F (36.3 C)-97.6 F (36.4 C)] 97.3 F (36.3 C) (11/20 0503) Pulse Rate:  [69-71] 69  (11/20 0503) Resp:  [18-20] 19  (11/20 0503) BP: (123-149)/(70-96) 135/76 mmHg (11/20 0503) SpO2:  [98 %-100 %] 100 % (11/20 0503) Last BM Date: 01/29/12 Did not reexamine.   Intake/Output from previous day: 11/19 0701 - 11/20 0700 In: 440.3 [I.V.:440.3] Out: 250 [Urine:250]  Intake/Output this shift:    Lab Results:  Basename 01/31/12 0605 01/30/12 0550 01/29/12 1849  WBC 13.1* 10.1 11.0*  HGB 12.0 11.1* 12.0  HCT 36.0 33.9* 36.2  PLT 207 190 210   BMET  Basename 01/30/12 0550 01/29/12 1849  NA 147* 144  K 3.5 3.6  CL 114* 107  CO2 22 27  GLUCOSE 95 100*  BUN 26* 30*  CREATININE 1.21* 1.40*  CALCIUM 8.4 9.0   LFT  Basename 01/29/12 1849  PROT 6.1  ALBUMIN 2.7*  AST 25  ALT 35  ALKPHOS 175*  BILITOT 0.9  BILIDIR --  IBILI --   PT/INR  Basename 01/31/12 0605 01/30/12 1724  LABPROT 29.6* 40.7*  INR 3.01* 4.62*    Studies/Results: Abd 1 View (kub) 01/29/2012  *RADIOLOGY REPORT*  Clinical Data: Nausea, vomiting  ABDOMEN - 1 VIEW  Comparison: 04/30/2008  Findings: Right hip arthroplasty components partially seen.  There is a mild dextroscoliosis of the lumbar spine with multilevel degenerative changes. Small bowel decompressed.  There is a normal distribution of gas and stool throughout the colon and distending the rectum.  Visualized lung bases clear.  Bilateral pelvic phleboliths.  IMPRESSION:  Nonobstructive bowel gas pattern   Original Report Authenticated By: D. Andria Rhein, MD     ASSESMENT: * Dysphagia. 2008 dilation of esoph stricture.  * Hx gastric hyperplastic polyps and H pylori gastritis.  * Hx colon adenomatous polyps 2008.  * Renal insufficiency, chronic.  Improved.  *  Chronic Coumadin for PAF, idiopathic dilated cardiomyopathy. INR supratherapeutic. Coags still  Elevated despite Vitamin K    PLAN: *  Give FFP to allow for EGD/dilt today..    LOS: 2 days   Jennye Moccasin  01/31/2012, 8:29 AM Pager: [phone]    ________________________________________________________________________  [facility] MD note:  I reviewed the data and agree with the assessment and plan described above.  Will plan FFP immediately so that we may safely proceed with EGD today.   Rob Bunting, MD [facility] Pager [phone]

## 2012-01-31 NOTE — Progress Notes (Signed)
RT note: Pt on 3L [place] tolerating well off Bipap at this time SPO2 98%, RR 22, HR 74. RT will continue to monitor.

## 2012-02-01 ENCOUNTER — Encounter (HOSPITAL_COMMUNITY): Payer: Self-pay | Admitting: Gastroenterology

## 2012-02-01 ENCOUNTER — Encounter (HOSPITAL_COMMUNITY)
Admission: AD | Disposition: A | Discharge status: Skilled Nursing Facility | Payer: Self-pay | Source: Ambulatory Visit | Attending: Internal Medicine

## 2012-02-01 DIAGNOSIS — I059 Rheumatic mitral valve disease, unspecified: Secondary | ICD-10-CM

## 2012-02-01 DIAGNOSIS — K219 Gastro-esophageal reflux disease without esophagitis: Secondary | ICD-10-CM

## 2012-02-01 HISTORY — PX: ESOPHAGOGASTRODUODENOSCOPY: SHX5428

## 2012-02-01 LAB — BASIC METABOLIC PANEL
BUN: 35 mg/dL — ABNORMAL HIGH (ref 6–23)
CO2: 23 mEq/L (ref 19–32)
CO2: 23 mEq/L (ref 19–32)
Calcium: 8.9 mg/dL (ref 8.4–10.5)
Chloride: 100 mEq/L (ref 96–112)
Creatinine, Ser: 1.4 mg/dL — ABNORMAL HIGH (ref 0.50–1.10)
Glucose, Bld: 90 mg/dL (ref 70–99)
Glucose, Bld: 135 mg/dL — ABNORMAL HIGH (ref 70–99)
Potassium: 3.2 mEq/L — ABNORMAL LOW (ref 3.5–5.1)
Sodium: 147 mEq/L — ABNORMAL HIGH (ref 135–145)

## 2012-02-01 LAB — PREPARE FRESH FROZEN PLASMA: Unit division: 0

## 2012-02-01 LAB — CBC
HCT: 34 % — ABNORMAL LOW (ref 36.0–46.0)
Hemoglobin: 11.2 g/dL — ABNORMAL LOW (ref 12.0–15.0)
MCH: 32.7 pg (ref 26.0–34.0)
MCV: 99.1 fL (ref 78.0–100.0)
RBC: 3.43 MIL/uL — ABNORMAL LOW (ref 3.87–5.11)

## 2012-02-01 LAB — TROPONIN I: Troponin I: <0.3 ng/mL (ref <0.30)

## 2012-02-01 SURGERY — EGD (ESOPHAGOGASTRODUODENOSCOPY)
Anesthesia: Moderate Sedation

## 2012-02-01 MED ORDER — IRBESARTAN 75 MG PO TABS
75.0000 mg | ORAL_TABLET | Freq: Every day | ORAL | Status: DC
  Administered 2012-02-02: 75 mg via ORAL
  Filled 2012-02-01 (×2): qty 1

## 2012-02-01 MED ORDER — FENTANYL CITRATE 0.05 MG/ML IJ SOLN
INTRAMUSCULAR | Status: DC | PRN
  Administered 2012-02-01: 12.5 ug via INTRAVENOUS
  Administered 2012-02-01: 25 ug via INTRAVENOUS
  Administered 2012-02-01 (×2): 12.5 ug via INTRAVENOUS

## 2012-02-01 MED ORDER — MIDAZOLAM HCL 5 MG/ML IJ SOLN
INTRAMUSCULAR | Status: AC
  Filled 2012-02-01: qty 2

## 2012-02-01 MED ORDER — POTASSIUM CHLORIDE 10 MEQ/100ML IV SOLN: 10.0000 meq | Freq: Four times a day (QID) | INTRAVENOUS | Status: DC

## 2012-02-01 MED ORDER — FENTANYL CITRATE 0.05 MG/ML IJ SOLN
INTRAMUSCULAR | Status: AC
  Filled 2012-02-01: qty 2

## 2012-02-01 MED ORDER — POTASSIUM CHLORIDE 10 MEQ/100ML IV SOLN
10.0000 meq | INTRAVENOUS | Status: AC
  Administered 2012-02-01 (×4): 10 meq via INTRAVENOUS
  Filled 2012-02-01: qty 200
  Filled 2012-02-01 (×2): qty 100

## 2012-02-01 MED ORDER — PERFLUTREN LIPID MICROSPHERE
INTRAVENOUS | Status: AC
  Administered 2012-02-01: 11:00:00
  Filled 2012-02-01: qty 10

## 2012-02-01 MED ORDER — BUTAMBEN-TETRACAINE-BENZOCAINE 2-2-14 % EX AERO
INHALATION_SPRAY | CUTANEOUS | Status: DC | PRN
  Administered 2012-02-01: 2 via TOPICAL

## 2012-02-01 MED ORDER — MIDAZOLAM HCL 10 MG/2ML IJ SOLN
INTRAMUSCULAR | Status: DC | PRN
  Administered 2012-02-01: 2 mg via INTRAVENOUS
  Administered 2012-02-01: 1 mg via INTRAVENOUS
  Administered 2012-02-01: 2 mg via INTRAVENOUS
  Administered 2012-02-01: 1 mg via INTRAVENOUS

## 2012-02-01 MED ORDER — SPIRONOLACTONE 25 MG PO TABS
25.0000 mg | ORAL_TABLET | Freq: Every day | ORAL | Status: DC
  Filled 2012-02-01: qty 1

## 2012-02-01 NOTE — Progress Notes (Signed)
Physical Therapy Treatment Patient Details Name: [name] MRN: [id number] DOB: [birth year] Today's Date: 02/01/2012 Time: 5284-1324 PT Time Calculation (min): 39 min  PT Assessment / Plan / Recommendation Comments on Treatment Session  Ms. Schickling did well today. Noted events from yesterday. Agree that pt will need 24 hour support at home if she plans to d/c home as pt unsteady and weak with decreased tolerance for activity. If this is not available agree with ST-SNF.     Follow Up Recommendations  Supervision/Assistance - 24 hour; Home health PT vs SNF (SNF if family cannot provide adequate support)     Does the patient have the potential to tolerate intense rehabilitation     Barriers to Discharge        Equipment Recommendations  None recommended by PT    Recommendations for Other Services    Frequency Min 3X/week   Plan Discharge plan remains appropriate;Frequency remains appropriate    Precautions / Restrictions Precautions Precautions: Fall Restrictions Weight Bearing Restrictions: No   Pertinent Vitals/Pain VSS    Mobility  Bed Mobility Bed Mobility: Supine to Sit;Sitting - Scoot to [place] of Bed;Sit to Supine Supine to Sit: 4: Min assist Sitting - Scoot to [place] of Bed: 5: Supervision Sit to Supine: 4: Min assist Scooting to HOB: 1: +2 Total assist Scooting to [facility]: Patient Percentage: 10% Details for Bed Mobility Assistance: min tactile assist for follow through of movements given general fatigue and weakness Transfers Transfers: Sit to Stand;Stand to Sit (3 trials) Sit to Stand: 4: Min assist;From bed;With upper extremity assist Stand to Sit: 4: Min assist;To bed;With upper extremity assist Details for Transfer Assistance: minA to bring trunk anteriorly over BOS and stabilize for lift off and standing, minA to control descent Ambulation/Gait Ambulation/Gait Assistance: Not tested (comment)    Exercises General Exercises - Lower Extremity Ankle  Circles/Pumps: AROM;Both;20 reps;Supine Long Arc Quad: AROM;Both;10 reps;Seated Heel Slides: AAROM;Both;10 reps;Supine Hip Flexion/Marching: AROM;Both;10 reps;Seated   PT Diagnosis:    PT Problem List:   PT Treatment Interventions:     PT Goals Acute Rehab PT Goals Pt will go Sit to Stand: with modified independence PT Goal: Sit to Stand - Progress: Goal set today Pt will go Stand to Sit: with modified independence PT Goal: Stand to Sit - Progress: Goal set today Pt will Transfer Bed to Chair/Chair to Bed: with modified independence PT Transfer Goal: Bed to Chair/Chair to Bed - Progress: Goal set today Pt will Stand: with modified independence;3 - 5 min PT Goal: Stand - Progress: Progressing toward goal Pt will Ambulate: >150 feet;with modified independence;with rolling walker PT Goal: Ambulate - Progress: Progressing toward goal Pt will Perform Home Exercise Program: Independently PT Goal: Perform Home Exercise Program - Progress: Goal set today  Visit Information  Last PT Received On: 02/01/12 Assistance Needed: +1    Subjective Data  Subjective: I am going for my procedure today, yesterday they almost lost me.  Patient Stated Goal: something to drink, specifically lemonade   Cognition  Overall Cognitive Status: Appears within functional limits for tasks assessed/performed Arousal/Alertness: Awake/alert Orientation Level: Appears intact for tasks assessed Behavior During Session: [facility] for tasks performed    Balance  Balance Balance Assessed: Yes Static Standing Balance Static Standing - Balance Support: Bilateral upper extremity supported Static Standing - Level of Assistance: 4: Min assist Static Standing - Comment/# of Minutes: minA for tall standing and improved breath control with tall posture, stood 1 minute x2 to progress tolerance for standing  as pre-gait exercise Dynamic Standing Balance Dynamic Standing - Balance Support: Bilateral upper extremity  supported Dynamic Standing - Level of Assistance: 4: Min assist Dynamic Standing - Balance Activities: Lateral lean/weight shifting  End of Session PT - End of Session Equipment Utilized During Treatment: Gait belt Activity Tolerance: Patient tolerated treatment well Patient left: in bed;with call bell/phone within reach Nurse Communication: Mobility status   GP     [facility] HELEN 02/01/2012, 3:45 PM

## 2012-02-01 NOTE — Progress Notes (Signed)
  Echocardiogram 2D Echocardiogram with Definity has been performed.  [name] 02/01/2012, 11:13 AM

## 2012-02-01 NOTE — Progress Notes (Signed)
     [facility] Daily Rounding Note 02/01/2012, 9:56 AM  SUBJECTIVE:       Breathing better today. C/o dry mouth and taking ice chips only  OBJECTIVE:         Vital signs in last 24 hours:    Temp:  [97.7 F (36.5 C)-98.7 F (37.1 C)] 97.8 F (36.6 C) (11/21 0800) Pulse Rate:  [69-87] 70  (11/21 0800) Resp:  [16-38] 20  (11/21 0800) BP: (114-160)/(32-101) 136/79 mmHg (11/21 0800) SpO2:  [94 %-100 %] 99 % (11/21 0800) FiO2 (%):  [35 %] 35 % (11/20 1700) Weight:  [72.4 kg (159 lb 9.8 oz)] 72.4 kg (159 lb 9.8 oz) (11/20 1444) Last BM Date: 01/29/12 General: moderately ill looking.  NAD.  comfortable   Heart: paced, regular rhythm in 70s.  Chest: clear in front (echo in progress).  Breathing in not labored but a bit dyspneic with speaking  Abdomen: soft, NT, ND.  No mass.  Active BS  Extremities: no edema in feet Neuro/Psych:  Pleasant, not confused, good historian  Intake/Output from previous day: 11/20 0701 - 11/21 0700 In: 849 [P.O.:60; I.V.:400; Blood:335; IV Piggyback:54] Out: 1430 [Urine:1430]  Intake/Output this shift: Total I/O In: 110 [I.V.:10; IV Piggyback:100] Out: -   Lab Results:  Basename 02/01/12 0159 01/31/12 0605 01/30/12 0550  WBC 14.7* 13.1* 10.1  HGB 11.2* 12.0 11.1*  HCT 34.0* 36.0 33.9*  PLT 215 207 190   BMET  Basename 02/01/12 0159 01/31/12 2005 01/30/12 0550  NA 147* 146* 147*  K 3.2* 3.3* 3.5  CL 109 109 114*  CO2 23 23 22   GLUCOSE 90 107* 95  BUN 32* 31* 26*  CREATININE 1.39* 1.41* 1.21*  CALCIUM 8.7 8.7 8.4   LFT  Basename 01/29/12 1849  PROT 6.1  ALBUMIN 2.7*  AST 25  ALT 35  ALKPHOS 175*  BILITOT 0.9  BILIDIR --  IBILI --   PT/INR  Basename 02/01/12 0159 01/31/12 0605  LABPROT 16.1* 29.6*  INR 1.32 3.01*   Hepatitis Panel No results found for this basename: HEPBSAG,HCVAB,HEPAIGM,HEPBIGM in the last 72 hours  Studies/Results: Dg Chest Port 1v Same Day  01/31/2012  .  IMPRESSION:  1.  Asymmetric airspace  infiltrates or edema, right greater than left. 2.  Stable cardiomegaly and postop changes.   Original Report Authenticated By: D. Andria Rhein, MD     ASSESMENT: * Dysphagia, recurrent. 2008 dilation of esoph stricture.  * Acute on chronic CHF.  Acute resp distress resolved. * Hx gastric hyperplastic polyps and H pylori gastritis.  * Hx colon adenomatous polyps 2008.  * Renal insufficiency, chronic stage 3 kidney disease.  * UTI  On Rocephin. * Chronic Coumadin for PAF, idiopathic dilated cardiomyopathy. INR reversed with FFP and vit K.    PLAN: *  EGD and esoph dilatation.  Around 3 30 PM today.    LOS: 3 days   Jennye Moccasin  02/01/2012, 9:56 AM Pager: [phone]

## 2012-02-01 NOTE — Progress Notes (Signed)
Agree, EGD later today.  She is breathing easier

## 2012-02-01 NOTE — Interval H&P Note (Signed)
History and Physical Interval Note:  02/01/2012 3:19 PM  [name]  has presented today for surgery, with the diagnosis of dysphagia  The various methods of treatment have been discussed with the patient and family. After consideration of risks, benefits and other options for treatment, the patient has consented to  Procedure(s) (LRB) with comments: ESOPHAGOGASTRODUODENOSCOPY (EGD) (N/A) as a surgical intervention .  The patient's history has been reviewed, patient examined, no change in status, stable for surgery.  I have reviewed the patient's chart and labs.  Questions were answered to the patient's satisfaction.     Rob Bunting

## 2012-02-01 NOTE — H&P (View-Only) (Signed)
     [facility] Daily Rounding Note 02/01/2012, 9:56 AM  SUBJECTIVE:       Breathing better today. C/o dry mouth and taking ice chips only  OBJECTIVE:         Vital signs in last 24 hours:    Temp:  [97.7 F (36.5 C)-98.7 F (37.1 C)] 97.8 F (36.6 C) (11/21 0800) Pulse Rate:  [69-87] 70  (11/21 0800) Resp:  [16-38] 20  (11/21 0800) BP: (114-160)/(32-101) 136/79 mmHg (11/21 0800) SpO2:  [94 %-100 %] 99 % (11/21 0800) FiO2 (%):  [35 %] 35 % (11/20 1700) Weight:  [72.4 kg (159 lb 9.8 oz)] 72.4 kg (159 lb 9.8 oz) (11/20 1444) Last BM Date: 01/29/12 General: moderately ill looking.  NAD.  comfortable   Heart: paced, regular rhythm in 70s.  Chest: clear in front (echo in progress).  Breathing in not labored but a bit dyspneic with speaking  Abdomen: soft, NT, ND.  No mass.  Active BS  Extremities: no edema in feet Neuro/Psych:  Pleasant, not confused, good historian  Intake/Output from previous day: 11/20 0701 - 11/21 0700 In: 849 [P.O.:60; I.V.:400; Blood:335; IV Piggyback:54] Out: 1430 [Urine:1430]  Intake/Output this shift: Total I/O In: 110 [I.V.:10; IV Piggyback:100] Out: -   Lab Results:  Basename 02/01/12 0159 01/31/12 0605 01/30/12 0550  WBC 14.7* 13.1* 10.1  HGB 11.2* 12.0 11.1*  HCT 34.0* 36.0 33.9*  PLT 215 207 190   BMET  Basename 02/01/12 0159 01/31/12 2005 01/30/12 0550  NA 147* 146* 147*  K 3.2* 3.3* 3.5  CL 109 109 114*  CO2 23 23 22  GLUCOSE 90 107* 95  BUN 32* 31* 26*  CREATININE 1.39* 1.41* 1.21*  CALCIUM 8.7 8.7 8.4   LFT  Basename 01/29/12 1849  PROT 6.1  ALBUMIN 2.7*  AST 25  ALT 35  ALKPHOS 175*  BILITOT 0.9  BILIDIR --  IBILI --   PT/INR  Basename 02/01/12 0159 01/31/12 0605  LABPROT 16.1* 29.6*  INR 1.32 3.01*   Hepatitis Panel No results found for this basename: HEPBSAG,HCVAB,HEPAIGM,HEPBIGM in the last 72 hours  Studies/Results: Dg Chest Port 1v Same Day  01/31/2012  .  IMPRESSION:  1.  Asymmetric airspace  infiltrates or edema, right greater than left. 2.  Stable cardiomegaly and postop changes.   Original Report Authenticated By: D. Hassell III, MD     ASSESMENT: * Dysphagia, recurrent. 2008 dilation of esoph stricture.  * Acute on chronic CHF.  Acute resp distress resolved. * Hx gastric hyperplastic polyps and H pylori gastritis.  * Hx colon adenomatous polyps 2008.  * Renal insufficiency, chronic stage 3 kidney disease.  * UTI  On Rocephin. * Chronic Coumadin for PAF, idiopathic dilated cardiomyopathy. INR reversed with FFP and vit K.    PLAN: *  EGD and esoph dilatation.  Around 3 30 PM today.    LOS: 3 days   [name]  02/01/2012, 9:56 AM Pager: [phone]  

## 2012-02-01 NOTE — Progress Notes (Signed)
Subjective: Breathing is much better today, now only on [place] oxygen, no chest pain or nausea.  Objective: Vital signs in last 24 hours: Temp:  [97.3 F (36.3 C)-98.7 F (37.1 C)] 98.3 F (36.8 C) (11/21 0400) Pulse Rate:  [69-87] 70  (11/21 0600) Resp:  [16-38] 19  (11/21 0600) BP: (114-160)/(32-101) 138/80 mmHg (11/21 0600) SpO2:  [94 %-100 %] 99 % (11/21 0600) FiO2 (%):  [35 %] 35 % (11/20 1700) Weight:  [72.4 kg (159 lb 9.8 oz)] 72.4 kg (159 lb 9.8 oz) (11/20 1444) Weight change:    Intake/Output from previous day: 11/20 0701 - 11/21 0700 In: 839 [P.O.:60; I.V.:390; Blood:335; IV Piggyback:54] Out: 1430 [Urine:1430]   General appearance: alert, cooperative and no distress Resp: clear to auscultation bilaterally and anteriorly Cardio: regular rate and rhythm Extremities: extremities normal, atraumatic, no cyanosis or edema  Lab Results:  Basename 02/01/12 0159 01/31/12 0605  WBC 14.7* 13.1*  HGB 11.2* 12.0  HCT 34.0* 36.0  PLT 215 207   BMET  Basename 02/01/12 0159 01/31/12 2005  NA 147* 146*  K 3.2* 3.3*  CL 109 109  CO2 23 23  GLUCOSE 90 107*  BUN 32* 31*  CREATININE 1.39* 1.41*  CALCIUM 8.7 8.7   CMET CMP     Component Value Date/Time   NA 147* 02/01/2012 0159   K 3.2* 02/01/2012 0159   CL 109 02/01/2012 0159   CO2 23 02/01/2012 0159   GLUCOSE 90 02/01/2012 0159   BUN 32* 02/01/2012 0159   CREATININE 1.39* 02/01/2012 0159   CALCIUM 8.7 02/01/2012 0159   PROT 6.1 01/29/2012 1849   ALBUMIN 2.7* 01/29/2012 1849   AST 25 01/29/2012 1849   ALT 35 01/29/2012 1849   ALKPHOS 175* 01/29/2012 1849   BILITOT 0.9 01/29/2012 1849   GFRNONAA 34* 02/01/2012 0159   GFRAA 39* 02/01/2012 0159    CBG (last 3)   Basename 01/31/12 0754 01/30/12 0804 01/29/12 1708  GLUCAP 95 102* 111*    INR RESULTS:   Lab Results  Component Value Date   INR 1.32 02/01/2012   INR 3.01* 01/31/2012   INR 4.62* 01/30/2012     Studies/Results: Dg Chest Port 1v Same  Day  01/31/2012  *RADIOLOGY REPORT*  Clinical Data: Shortness of breath  PORTABLE CHEST - 1 VIEW SAME DAY  Comparison: 06/10/2010  Findings: Left subclavian AICD stable position.  Stable cardiomegaly.  New asymmetric airspace opacities, centrally throughout much of the right lung, and to a lesser degree at the left lung base.  No definite effusion.  Atheromatous aorta. Degenerative changes of bilateral shoulders.  IMPRESSION:  1.  Asymmetric airspace infiltrates or edema, right greater than left. 2.  Stable cardiomegaly and postop changes.   Original Report Authenticated By: D. Andria Rhein, MD     Medications: I have reviewed the patient's current medications.  Assessment/Plan: #1 Acute CHF: from fluid overload and much improved with IV lasix. She has acute on chronic CHF from systolic dysfunction. She should be stable for planned EGD today. #2 Hypokalemia: mild and will give IV potassium and aldactone after esophagus dilated.  LOS: 3 days   [name] 02/01/2012, 7:03 AM

## 2012-02-01 NOTE — Op Note (Signed)
[facility] [address] [place], [address]   ENDOSCOPY PROCEDURE REPORT  PATIENT: [name], [name]  MR#: [id number] BIRTHDATE: 1926-09-03 , 85  yrs. old GENDER: Female ENDOSCOPIST: Rachael Fee, MD PROCEDURE DATE:  02/01/2012 PROCEDURE:  EGD, balloon dilation ASA CLASS:     Class III INDICATIONS:  dysphagia. MEDICATIONS: Fentanyl-Detailed 62.5 mcg IV and Versed 6 mg IV TOPICAL ANESTHETIC: Cetacaine Spray  DESCRIPTION OF PROCEDURE: After the risks benefits and alternatives of the procedure were thoroughly explained, informed consent was obtained.  The Pentax Gastroscope [id number] endoscope was introduced through the mouth and advanced to the second portion of the duodenum. Without limitations.  The instrument was slowly withdrawn as the mucosa was fully examined.    There was a smooth stenosis at GE junction (lumen 15mm) just above a 2-3cm hiatal hernia.  The esophagus is distally tortuous and foreshortened by the hiatal hernia.  A 20mm CRE TTS balloon was held inflated for 90 seconds at GE junction.  The examination was otherwise normal.  Retroflexed views revealed no abnormalities. The scope was then withdrawn from the patient and the procedure completed. COMPLICATIONS: There were no complications.  ENDOSCOPIC IMPRESSION: There was a smooth stenosis at GE junction (lumen 15mm) just above a 2-3cm hiatal hernia.  The esophagus is distally tortuous and foreshortened by the hiatal hernia.  A 20mm CRE TTS balloon was held inflated for 90 seconds at GE junction.  The examination was otherwise normal.  RECOMMENDATIONS: Clear liquids, advance as tolerated. Ok to restart coumadinn tomorrow and if tolerating diet she can go home.  Should follow up in 6-8 weeks in office with Dr.  Marina Goodell to check on response to dilation today, please call to make that appointment.   eSigned:  Rachael Fee, MD 02/01/2012 4:10 PM   CC: Yancey Flemings, MD

## 2012-02-01 NOTE — Progress Notes (Signed)
Patient Name: [name]      SUBJECTIVE: feeling much better  Past Medical History  Diagnosis Date  . Benign neoplasm of colon   . Diverticulosis of colon (without mention of hemorrhage)   . Reflux esophagitis   . Gastric polyp     history of  . GERD (gastroesophageal reflux disease)   . Stricture and stenosis of esophagus   . Reactive airway disease   . Anemia, iron deficiency   . CHF (congestive heart failure)   . Restless leg syndrome   . Hypercholesterolemia   . Nonischemic cardiomyopathy     EF 20%  . Personal history of sudden cardiac death successfully resuscitated     status post cardioverter- defibrillator  . Atrial fibrillation   . Diabetes mellitus   . Osteoarthritis   . ICD (implantable cardiac defibrillator) in place   . Hypertension   . UTI (lower urinary tract infection)     PHYSICAL EXAM Filed Vitals:   02/01/12 0600 02/01/12 0800 02/01/12 1145 02/01/12 1158  BP: 138/80 136/79 131/76   Pulse: 70 70    Temp:  97.8 F (36.6 C)  97.4 F (36.3 C)  TempSrc:  Oral  Oral  Resp: 19 20    Height:      Weight:      SpO2: 99% 99%      Well developed and nourished in no acute distress HENT normal Neck supple with JVP-flat Carotids brisk and full without bruits Clear Regular rate and rhythm, no murmurs or gallops Abd-soft with active BS without hepatomegaly No Clubbing cyanosis edema Skin-warm and dry A & Oriented  Grossly normal sensory and motor function   TELEMETRY: Reviewed telemetry pt in av pacing   Intake/Output Summary (Last 24 hours) at 02/01/12 1301 Last data filed at 02/01/12 1200  Gross per 24 hour  Intake    628 ml  Output   1430 ml  Net   -802 ml    LABS: Basic Metabolic Panel:  Lab 02/01/12 1610 01/31/12 2005 01/30/12 0550 01/29/12 1849  NA 147* 146* 147* 144  K 3.2* 3.3* 3.5 3.6  CL 109 109 114* 107  CO2 23 23 22 27   GLUCOSE 90 107* 95 100*  BUN 32* 31* 26* 30*  CREATININE 1.39* 1.41* 1.21* 1.40*  CALCIUM 8.7  8.7 -- --  MG -- -- -- --  PHOS -- -- -- --   Cardiac Enzymes:  Basename 02/01/12 0159 01/31/12 2005 01/31/12 1555  CKTOTAL -- -- --  CKMB -- -- --  CKMBINDEX -- -- --  TROPONINI <0.30 <0.30 <0.30   CBC:  Lab 02/01/12 0159 01/31/12 0605 01/30/12 0550 01/29/12 1849  WBC 14.7* 13.1* 10.1 11.0*  NEUTROABS -- -- -- --  HGB 11.2* 12.0 11.1* 12.0  HCT 34.0* 36.0 33.9* 36.2  MCV 99.1 99.4 98.8 98.9  PLT 215 207 190 210   PROTIME:  Basename 02/01/12 0159 01/31/12 0605 01/30/12 1724  LABPROT 16.1* 29.6* 40.7*  INR 1.32 3.01* 4.62*   Liver Function Tests:  Basename 01/29/12 1849  AST 25  ALT 35  ALKPHOS 175*  BILITOT 0.9  PROT 6.1  ALBUMIN 2.7*   No results found for this basename: LIPASE:2,AMYLASE:2 in the last 72 hours BNP: BNP (last 3 results)  Basename 01/31/12 1555 01/29/12 1849  PROBNP 25282.0* 15984.0*   D-Dimer: No results found for this basename: DDIMER:2 in the last 72 hours Hemoglobin A1C: No results found for this basename: HGBA1C in the last 72  hours Fasting Lipid Panel: No results found for this basename: CHOL,HDL,LDLCALC,TRIG,CHOLHDL,LDLDIRECT in the last 72 hours Thyroid Function Tests: No results found for this basename: TSH,T4TOTAL,FREET3,T3FREE,THYROIDAB in the last 72 hours Anemia Panel: No results found for this basename: VITAMINB12,FOLATE,FERRITIN,TIBC,IRON,RETICCTPCT in the last 72 hours   Device Interrogation:   ASSESSMENT AND PLAN:  Patient Active Hospital Problem List: Acute esophageal obstruction (01/29/2012)   ANEMIA, IRON DEFICIENCY (04/30/2007)   HYPERTENSION (04/30/2007)   CARDIOMYOPATHY, PRIMARY (05/26/2008)   ESOPHAGEAL STRICTURE (04/30/2007)   CHF (congestive heart failure), NYHA class IV (01/31/2012)   Hypokalemia   Much improved Will bneed to reiinitiae meds post EGD First avapro 2nd prob beta blocker but i dont remember why she is not on it    Signed, Sherryl Manges MD  02/01/2012

## 2012-02-01 NOTE — Progress Notes (Signed)
Pt was transferred to 4700 in stable condition after report was called to [facility].

## 2012-02-02 ENCOUNTER — Encounter (HOSPITAL_COMMUNITY): Payer: Self-pay

## 2012-02-02 ENCOUNTER — Encounter (HOSPITAL_COMMUNITY): Payer: Self-pay | Admitting: Gastroenterology

## 2012-02-02 LAB — BASIC METABOLIC PANEL
BUN: 33 mg/dL — ABNORMAL HIGH (ref 6–23)
CO2: 26 mEq/L (ref 19–32)
Chloride: 101 mEq/L (ref 96–112)
Creatinine, Ser: 1.34 mg/dL — ABNORMAL HIGH (ref 0.50–1.10)
Glucose, Bld: 110 mg/dL — ABNORMAL HIGH (ref 70–99)

## 2012-02-02 LAB — CBC
HCT: 34.6 % — ABNORMAL LOW (ref 36.0–46.0)
MCH: 33.6 pg (ref 26.0–34.0)
MCHC: 33.5 g/dL (ref 30.0–36.0)
MCV: 100.3 fL — ABNORMAL HIGH (ref 78.0–100.0)
RDW: 15 % (ref 11.5–15.5)

## 2012-02-02 LAB — GLUCOSE, CAPILLARY
Glucose-Capillary: 106 mg/dL — ABNORMAL HIGH (ref 70–99)
Glucose-Capillary: 102 mg/dL — ABNORMAL HIGH (ref 70–99)

## 2012-02-02 MED ORDER — METOPROLOL TARTRATE 25 MG PO TABS
25.0000 mg | ORAL_TABLET | Freq: Three times a day (TID) | ORAL | Status: DC
  Administered 2012-02-02: 25 mg via ORAL
  Filled 2012-02-02 (×6): qty 1

## 2012-02-02 MED ORDER — IRBESARTAN 75 MG PO TABS
37.5000 mg | ORAL_TABLET | Freq: Every day | ORAL | Status: DC
  Filled 2012-02-02 (×2): qty 0.5

## 2012-02-02 MED ORDER — WARFARIN SODIUM 2 MG PO TABS
2.0000 mg | ORAL_TABLET | Freq: Every day | ORAL | Status: DC
  Filled 2012-02-02: qty 1

## 2012-02-02 MED ORDER — WARFARIN SODIUM 6 MG PO TABS
6.0000 mg | ORAL_TABLET | Freq: Once | ORAL | Status: AC
  Administered 2012-02-02: 6 mg via ORAL
  Filled 2012-02-02: qty 1

## 2012-02-02 MED ORDER — SPIRONOLACTONE 12.5 MG HALF TABLET
12.5000 mg | ORAL_TABLET | Freq: Every day | ORAL | Status: DC
  Administered 2012-02-02 – 2012-02-03 (×2): 12.5 mg via ORAL
  Filled 2012-02-02 (×2): qty 1

## 2012-02-02 MED ORDER — METOPROLOL TARTRATE 25 MG PO TABS
25.0000 mg | ORAL_TABLET | Freq: Two times a day (BID) | ORAL | Status: DC
  Filled 2012-02-02 (×2): qty 1

## 2012-02-02 MED ORDER — AMIODARONE HCL 100 MG PO TABS
100.0000 mg | ORAL_TABLET | Freq: Every day | ORAL | Status: DC
  Administered 2012-02-02 – 2012-02-03 (×2): 100 mg via ORAL
  Filled 2012-02-02 (×3): qty 1

## 2012-02-02 MED ORDER — DICLOFENAC SODIUM 1 % TD GEL
2.0000 g | Freq: Four times a day (QID) | TRANSDERMAL | Status: DC | PRN
  Administered 2012-02-02 – 2012-02-10 (×6): 2 g via TOPICAL
  Filled 2012-02-02: qty 100

## 2012-02-02 MED ORDER — WARFARIN - PHYSICIAN DOSING INPATIENT
Freq: Every day | Status: DC
  Administered 2012-02-02 – 2012-02-06 (×3)

## 2012-02-02 MED ORDER — DIGOXIN 0.0625 MG HALF TABLET
0.0625 mg | ORAL_TABLET | Freq: Every day | ORAL | Status: DC
  Administered 2012-02-02 – 2012-02-12 (×11): 0.0625 mg via ORAL
  Filled 2012-02-02 (×11): qty 1

## 2012-02-02 NOTE — Plan of Care (Signed)
Problem: Phase II Progression Outcomes Goal: IV changed to normal saline lock Outcome: Completed/Met Date Met:  02/02/12 pts IV is saline locked, goal met Goal: Other Phase II Outcomes/Goals Outcome: Progressing Pt still c/o generalized weakness, pt also c/o back pain, PRN tylenol as ordered and pt repositioned in bed for comfort, will continue to monitor

## 2012-02-02 NOTE — Progress Notes (Signed)
Device intyerggoatyion >> Aflutter with RVR  And no Bi V pacing  Overall deterioration sinceaug, will decrease amio, add beta blocker, short acting for now, not ideal but wil change Decrease arb and aldactone for BP support And add dig  Will try and identify onset of aflutter

## 2012-02-02 NOTE — Progress Notes (Signed)
Patient: [name] Date of Encounter: 02/02/2012, 8:07 AM Admit date: 01/29/2012     Subjective  [name] is sleepy but arousable and answering questions appropriately. Her daughter is at the bedside. She reports fatigue but denies CP, SOB or palpitations.   Objective  Physical Exam: Vitals: BP 97/65  Pulse 96  Temp 97.8 F (36.6 C) (Oral)  Resp 20  Ht 5\' 2"  (1.575 m)  Wt 158 lb 15.2 oz (72.1 kg)  BMI 29.07 kg/m2  SpO2 98% General: Well developed, elderly 78 year old female in no acute distress. Neck: Supple. JVD not elevated. Lungs: Clear bilaterally to auscultation without wheezes, rales, or rhonchi. Breathing is unlabored. Heart: RRR S1 S2 without murmurs, rubs, or gallops.  Abdomen: Soft, non-distended. Extremities: No clubbing or cyanosis. No edema.  Distal pedal pulses are 2+ and equal bilaterally. Neuro: Alert and oriented X 3. Moves all extremities spontaneously. No focal deficits.  Intake/Output:  Intake/Output Summary (Last 24 hours) at 02/02/12 0807 Last data filed at 02/01/12 1700  Gross per 24 hour  Intake    584 ml  Output   1025 ml  Net   -441 ml    Inpatient Medications:     . amiodarone  200 mg Oral Daily  . cefTRIAXone (ROCEPHIN)  IV  1 g Intravenous Q24H  . furosemide  40 mg Intravenous Q12H  . irbesartan  75 mg Oral QHS  . levothyroxine  100 mcg Oral Daily  . [COMPLETED] perflutren lipid microspheres (DEFINITY) IV suspension      . [COMPLETED] potassium chloride  10 mEq Intravenous Q1 Hr x 4  . spironolactone  25 mg Oral Daily  . warfarin  2 mg Oral q1800  . Warfarin - Physician Dosing Inpatient   Does not apply q1800      . 0.9 % NaCl with KCl 20 mEq / L 30 mL/hr at 01/31/12 0717  . [DISCONTINUED] sodium chloride 250 mL (01/31/12 1300)    Labs:  [name] 02/02/12 0555 02/01/12 1808 02/01/12 0159  NA -- 137 147*  K -- 4.0 3.2*  CL -- 100 109  CO2 -- 23 23  GLUCOSE -- 135* 90  BUN -- 35* 32*  CREATININE -- 1.40* 1.39*    CALCIUM -- 8.9 8.7  MG 1.7 -- --  PHOS -- -- --    Basename 02/02/12 0555 02/01/12 0159  WBC 13.0* 14.7*  NEUTROABS -- --  HGB 11.6* 11.2*  HCT 34.6* 34.0*  MCV 100.3* 99.1  PLT 214 215    Basename 02/01/12 0159 01/31/12 2005 01/31/12 1555  CKTOTAL -- -- --  CKMB -- -- --  TROPONINI <0.30 <0.30 <0.30    Basename 02/01/12 0159  INR 1.32    Radiology/Studies: Abd 1 View (kub)  01/29/2012  *RADIOLOGY REPORT*  Clinical Data: Nausea, vomiting  ABDOMEN - 1 VIEW  Comparison: 04/30/2008  Findings: Right hip arthroplasty components partially seen.  There is a mild dextroscoliosis of the lumbar spine with multilevel degenerative changes. Small bowel decompressed.  There is a normal distribution of gas and stool throughout the colon and distending the rectum.  Visualized lung bases clear.  Bilateral pelvic phleboliths.  IMPRESSION:  Nonobstructive bowel gas pattern   Original Report Authenticated By: D. Andria Rhein, MD    Dg Chest [place] 1v Same Day  01/31/2012  *RADIOLOGY REPORT*  Clinical Data: Shortness of breath  PORTABLE CHEST - 1 VIEW SAME DAY  Comparison: 06/10/2010  Findings: Left subclavian AICD stable position.  Stable cardiomegaly.  New asymmetric airspace opacities, centrally throughout much of the right lung, and to a lesser degree at the left lung base.  No definite effusion.  Atheromatous aorta. Degenerative changes of bilateral shoulders.  IMPRESSION:  1.  Asymmetric airspace infiltrates or edema, right greater than left. 2.  Stable cardiomegaly and postop changes.   Original Report Authenticated By: D. Andria Rhein, MD     Echocardiogram: Study Conclusions - Left ventricle: LVEF is severely depressed at approximately 15 to 25% with akinesisinferoseptum, posterior wall, inferior wall and apex; severe hypokinesis elsewhere. The cavity size was severely dilated. Wall thickness was normal. - Aortic valve: Mild regurgitation. - Mitral valve: Moderate regurgitation. - Left  atrium: Suspicious for small PFO by color doppler The atrium was moderately to severely dilated. - Pulmonary arteries: PA peak pressure: 40mm Hg (S).  Telemetry: AV paced predominantly; occasional PVCs, NSVT    Assessment and Plan  1. NICM, EF 20%, s/p CRT-P   2. Acute on chronic systolic HF - decompensated in setting of esophageal obstruction, UTI and FFP administration; now improving; she appears comfortable lying flat this AM; lungs are clear and LE are without edema; probably transition Lasix to PO today  3. PAFib - none this admission; currently AV paced; per GI note today, may resume anticoagulation 4. PVCs/NSVT - asymptomatic; K repleted overnight; Mg level 1.7; ectopy will likely improve once BB resumed (was previously on Toprol XL 50 mg daily per office note last year; will add short-acting lopressor for now and follow BP) 6. Renal insufficiency  Signed, Makayah Pauli PA-C

## 2012-02-02 NOTE — Progress Notes (Signed)
Subjective: Initially, she did fine after her esophageal stricture dilatation and rested well overnight.  Were this morning she is acutely short of breath and requested that we restart positive airway pressure treatment.  She is not having productive cough and she is swallowing clear liquids diet without problem.  Objective: Vital signs in last 24 hours: Temp:  [97.4 F (36.3 C)-98.2 F (36.8 C)] 97.8 F (36.6 C) (11/22 0610) Pulse Rate:  [72-96] 96  (11/22 0610) Resp:  [14-29] 20  (11/22 0610) BP: (93-131)/(59-80) 97/65 mmHg (11/22 0610) SpO2:  [92 %-100 %] 99 % (11/22 0700) Weight:  [70.716 kg (155 lb 14.4 oz)-72.1 kg (158 lb 15.2 oz)] 72.1 kg (158 lb 15.2 oz) (11/22 0610) Weight change: -1.684 kg (-3 lb 11.4 oz)   Intake/Output from previous day: 11/21 0701 - 11/22 0700 In: 874 [P.O.:300; I.V.:120; IV Piggyback:454] Out: 1025 [Urine:1025]   General appearance: alert, cooperative and moderate distress Resp: bibasilar crackles Cardio: irregularly irregular rhythm GI: soft, non-tender; bowel sounds normal; no masses,  no organomegaly Extremities: extremities normal, atraumatic, no cyanosis or edema  Lab Results:  Basename 02/02/12 0555 02/01/12 0159  WBC 13.0* 14.7*  HGB 11.6* 11.2*  HCT 34.6* 34.0*  PLT 214 215   BMET  Basename 02/01/12 1808 02/01/12 0159  NA 137 147*  K 4.0 3.2*  CL 100 109  CO2 23 23  GLUCOSE 135* 90  BUN 35* 32*  CREATININE 1.40* 1.39*  CALCIUM 8.9 8.7   CMET CMP     Component Value Date/Time   NA 137 02/01/2012 1808   K 4.0 02/01/2012 1808   CL 100 02/01/2012 1808   CO2 23 02/01/2012 1808   GLUCOSE 135* 02/01/2012 1808   BUN 35* 02/01/2012 1808   CREATININE 1.40* 02/01/2012 1808   CALCIUM 8.9 02/01/2012 1808   PROT 6.1 01/29/2012 1849   ALBUMIN 2.7* 01/29/2012 1849   AST 25 01/29/2012 1849   ALT 35 01/29/2012 1849   ALKPHOS 175* 01/29/2012 1849   BILITOT 0.9 01/29/2012 1849   GFRNONAA 34* 02/01/2012 1808   GFRAA 39* 02/01/2012  1808    CBG (last 3)   Basename 02/02/12 0612 02/01/12 2107 02/01/12 1515  GLUCAP 106* 125* 83    INR RESULTS:   Lab Results  Component Value Date   INR 1.32 02/01/2012   INR 3.01* 01/31/2012   INR 4.62* 01/30/2012     Studies/Results: Dg Chest Port 1v Same Day  01/31/2012  *RADIOLOGY REPORT*  Clinical Data: Shortness of breath  PORTABLE CHEST - 1 VIEW SAME DAY  Comparison: 06/10/2010  Findings: Left subclavian AICD stable position.  Stable cardiomegaly.  New asymmetric airspace opacities, centrally throughout much of the right lung, and to a lesser degree at the left lung base.  No definite effusion.  Atheromatous aorta. Degenerative changes of bilateral shoulders.  IMPRESSION:  1.  Asymmetric airspace infiltrates or edema, right greater than left. 2.  Stable cardiomegaly and postop changes.   Original Report Authenticated By: D. Andria Rhein, MD    Twelve-lead EKG Medications: I have reviewed the patient's current medications.  Assessment/Plan: #1 Esophageal Stricture: Much improved after dilatation yesterday.  We will advance her diet today. #2 Dyspnea: Appears most likely due to paroxysmal atrial fibrillation with baseline idiopathic dilated cardiomyopathy.  Further analysis of today's EKG is pending.  Defer management of cardiac issues to consulting cardiologist.  We will add CPAP per patient's request. #3 Anticoagulation: Will restart Coumadin per usual dosing.  LOS: 4 days  Bryahna Lesko G 02/02/2012, 9:05 AM

## 2012-02-02 NOTE — Clinical Social Work Placement (Addendum)
    Clinical Social Work Department CLINICAL SOCIAL WORK PLACEMENT NOTE 02/02/2012  Patient:  NOELANI, HARBACH  Account Number:  [id number] Admit date:  01/29/2012  Clinical Social Worker:  Hulan Fray  Date/time:  02/02/2012 08:36 AM  Clinical Social Work is seeking post-discharge placement for this patient at the following level of care:   SKILLED NURSING FACILITY   (*CSW will update this form in Epic as items are completed)   01/31/2012  Patient/family provided with [facility] Department of Clinical Social Work's list of facilities offering this level of care within the geographic area requested by the patient (or if unable, by the patient's family).  01/31/2012  Patient/family informed of their freedom to choose among providers that offer the needed level of care, that participate in Medicare, Medicaid or managed care program needed by the patient, have an available bed and are willing to accept the patient.  01/31/2012  Patient/family informed of [facility]' ownership interest in [facility], as well as of the fact that they are under no obligation to receive care at this facility.  PASARR submitted to EDS on 10/17/2011 PASARR number received from EDS on 10/17/2011  FL2 transmitted to all facilities in geographic area requested by pt/family on  02/01/2012 FL2 transmitted to all facilities within larger geographic area on   Patient informed that his/her managed care company has contracts with or will negotiate with  certain facilities, including the following:     Patient/family informed of bed offers received:  02/06/12 Patient chooses bed at [facility] Physician recommends and patient chooses bed at    Patient to be transferred to [facility]  on  02/12/12 Patient to be transferred to facility by [facility]  The following physician request were entered in Epic:   Additional Comments: Plan is for SNF placement at discharge. CSW initiated SNF search in  [place].

## 2012-02-02 NOTE — Progress Notes (Signed)
[facility] Progress Note    Since last GI note: EGD, dilation yesterday.  See report in chart.  This AM she was pretty sleepy but arousable. Tells me that she is swallowing better, liquids going down just fine now.  Objective: Vital signs in last 24 hours: Temp:  [97.4 F (36.3 C)-98.2 F (36.8 C)] 97.8 F (36.6 C) (11/22 0610) Pulse Rate:  [70-96] 96  (11/22 0610) Resp:  [14-29] 20  (11/22 0610) BP: (93-136)/(59-80) 97/65 mmHg (11/22 0610) SpO2:  [92 %-100 %] 98 % (11/22 0610) Weight:  [155 lb 14.4 oz (70.716 kg)-158 lb 15.2 oz (72.1 kg)] 158 lb 15.2 oz (72.1 kg) (11/22 0610) Last BM Date: 01/29/12 General: alert and oriented times 3 Heart: regular rate and rythm Abdomen: soft, non-tender, non-distended, normal bowel sounds   Lab Results:  Basename 02/02/12 0555 02/01/12 0159 01/31/12 0605  WBC 13.0* 14.7* 13.1*  HGB 11.6* 11.2* 12.0  PLT 214 215 207  MCV 100.3* 99.1 99.4    Basename 02/01/12 1808 02/01/12 0159 01/31/12 2005  NA 137 147* 146*  K 4.0 3.2* 3.3*  CL 100 109 109  CO2 23 23 23   GLUCOSE 135* 90 107*  BUN 35* 32* 31*  CREATININE 1.40* 1.39* 1.41*  CALCIUM 8.9 8.7 8.7   Basename 02/01/12 0159 01/31/12 0605  INR 1.32 3.01*     Medications: Scheduled Meds:   . amiodarone  200 mg Oral Daily  . cefTRIAXone (ROCEPHIN)  IV  1 g Intravenous Q24H  . furosemide  40 mg Intravenous Q12H  . irbesartan  75 mg Oral QHS  . levothyroxine  100 mcg Oral Daily  . [COMPLETED] perflutren lipid microspheres (DEFINITY) IV suspension      . [COMPLETED] potassium chloride  10 mEq Intravenous Q1 Hr x 4  . spironolactone  25 mg Oral Daily   Continuous Infusions:   . 0.9 % NaCl with KCl 20 mEq / L 30 mL/hr at 01/31/12 0717  . [DISCONTINUED] sodium chloride 250 mL (01/31/12 1300)   PRN Meds:.acetaminophen, acetaminophen, bisacodyl, HYDROcodone-acetaminophen, morphine injection, ondansetron (ZOFRAN) IV, ondansetron, zolpidem, [DISCONTINUED]  butamben-tetracaine-benzocaine, [DISCONTINUED] fentaNYL, [DISCONTINUED] midazolam    Assessment/Plan: 78 y.o. female with CHF, swallowing difficulty due to [facility] and smooth GE junction narrowing that was dilated yesterday  Swallowing seems improved.  OK to resume anticoagulation, advance diet as tolerated. I discussed situation with her family after EGD yesterday.  She will be calling to set up follow up appt with Dr. Marina Goodell in the office in 6-7 weeks to discuss her response to dilation, consider further dilation if needed.  She knows to chew her food well, eat slowly and take small bites.  Please call, page with any further questions or concerns.   Rob Bunting, MD  02/02/2012, 7:28 AM [facility] Pager [phone]

## 2012-02-02 NOTE — Consult Note (Signed)
ANTICOAGULATION CONSULT NOTE - Initial Consult  Pharmacy Consult for Coumadin Indication: atrial fibrillation  Allergies  Allergen Reactions  . Iodinated Diagnostic Agents     Unknown    Patient Measurements: Height: 5\' 2"  (157.5 cm) Weight: 158 lb 15.2 oz (72.1 kg) (bed weight/ pt to wak to stand) IBW/kg (Calculated) : 50.1   Vital Signs: Temp: 97.8 F (36.6 C) (11/22 0610) Temp src: Oral (11/22 0610) BP: 97/65 mmHg (11/22 0610) Pulse Rate: 96  (11/22 0610)  Labs:  Basename 02/02/12 0555 02/01/12 1808 02/01/12 0159 01/31/12 2005 01/31/12 1555 01/31/12 0605 01/30/12 1724  HGB 11.6* -- 11.2* -- -- -- --  HCT 34.6* -- 34.0* -- -- 36.0 --  PLT 214 -- 215 -- -- 207 --  APTT -- -- -- -- -- -- --  LABPROT -- -- 16.1* -- -- 29.6* 40.7*  INR -- -- 1.32 -- -- 3.01* 4.62*  HEPARINUNFRC -- -- -- -- -- -- --  CREATININE -- 1.40* 1.39* 1.41* -- -- --  CKTOTAL -- -- -- -- -- -- --  CKMB -- -- -- -- -- -- --  TROPONINI -- -- <0.30 <0.30 <0.30 -- --    Estimated Creatinine Clearance: 28.3 ml/min (by C-G formula based on Cr of 1.4).   Medical History: Past Medical History  Diagnosis Date  . Benign neoplasm of colon   . Diverticulosis of colon (without mention of hemorrhage)   . Reflux esophagitis   . Gastric polyp     history of  . GERD (gastroesophageal reflux disease)   . Stricture and stenosis of esophagus   . Reactive airway disease   . Anemia, iron deficiency   . CHF (congestive heart failure)   . Restless leg syndrome   . Hypercholesterolemia   . Nonischemic cardiomyopathy     EF 20%  . Personal history of sudden cardiac death successfully resuscitated     status post cardioverter- defibrillator  . Atrial fibrillation   . Diabetes mellitus   . Osteoarthritis   . Hypertension   . UTI (lower urinary tract infection)   . v45.02    Assessment: 85yof on coumadin pta (2mg  daily except 4mg  on Thursday) for afib. Has a history of GERD with esophageal stricture and  admitted with poor PO intake/dysphagia. INR 5.23 on admission and coumadin placed on hold for possible GI procedures.  11/18 INR 5.23 11/19 INR 4.62, Vit K 10mg  SQ x 1 11/20 INR 3.01 Vit K 5mg  IV x 1 11/21 INR 1.32  She is now s/p endoscopy and coumadin to resume. Will likely require higher doses initially to overcome vitamin k resistance. CBC stable.  Goal of Therapy:  INR 2-3 Monitor platelets by anticoagulation protocol: Yes   Plan:  1) Coumadin 6mg  x 1 2) Follow up INR in AM  Fredrik Rigger 02/02/2012,9:34 AM

## 2012-02-02 NOTE — Progress Notes (Signed)
Patient was not placed on CPAP on the floor, because she has not been diagnosed with OSA, does not use  A CPAP at home and there is no documented sleep study done. She was placed however on NIPPV during this admission due to respiratory distress and increased WOB. She is now on Room Air with SATS 94% and resting comfortably. RT will continue to monitor. Janece Canterbury RRT

## 2012-02-02 NOTE — Progress Notes (Signed)
Pt had low BP 80's/40-50's, MD aware, pt asymptomatic, metoprolol held this evening, Dr Graciela Husbands aware, pt had 22 beat run of Vtach, pt asymptomatic, will continue to monitor Worthy Flank, RN

## 2012-02-03 ENCOUNTER — Inpatient Hospital Stay (HOSPITAL_COMMUNITY): Payer: Medicare Other

## 2012-02-03 DIAGNOSIS — I639 Cerebral infarction, unspecified: Secondary | ICD-10-CM | POA: Diagnosis present

## 2012-02-03 DIAGNOSIS — I959 Hypotension, unspecified: Secondary | ICD-10-CM | POA: Diagnosis present

## 2012-02-03 DIAGNOSIS — N179 Acute kidney failure, unspecified: Secondary | ICD-10-CM | POA: Diagnosis present

## 2012-02-03 DIAGNOSIS — N189 Chronic kidney disease, unspecified: Secondary | ICD-10-CM

## 2012-02-03 LAB — CARBOXYHEMOGLOBIN: Carboxyhemoglobin: 1.3 % (ref 0.5–1.5)

## 2012-02-03 LAB — BASIC METABOLIC PANEL
BUN: 45 mg/dL — ABNORMAL HIGH (ref 6–23)
Calcium: 8.7 mg/dL (ref 8.4–10.5)
GFR calc non Af Amer: 21 mL/min — ABNORMAL LOW (ref >90)
Glucose, Bld: 120 mg/dL — ABNORMAL HIGH (ref 70–99)
Sodium: 137 mEq/L (ref 135–145)

## 2012-02-03 LAB — GLUCOSE, CAPILLARY

## 2012-02-03 LAB — PROTIME-INR: INR: 1.35 (ref 0.00–1.49)

## 2012-02-03 MED ORDER — AMIODARONE HCL IN DEXTROSE 360-4.14 MG/200ML-% IV SOLN
0.5000 mg/min | INTRAVENOUS | Status: DC
  Administered 2012-02-03 – 2012-02-07 (×8): 0.5 mg/min via INTRAVENOUS
  Filled 2012-02-03 (×17): qty 200

## 2012-02-03 MED ORDER — AMIODARONE LOAD VIA INFUSION
150.0000 mg | Freq: Once | INTRAVENOUS | Status: DC
  Filled 2012-02-03: qty 83.34

## 2012-02-03 MED ORDER — SODIUM CHLORIDE 0.9 % IV BOLUS (SEPSIS)
250.0000 mL | Freq: Once | INTRAVENOUS | Status: AC
  Administered 2012-02-03: 250 mL via INTRAVENOUS

## 2012-02-03 MED ORDER — AMIODARONE HCL IN DEXTROSE 360-4.14 MG/200ML-% IV SOLN
1.0000 mg/min | INTRAVENOUS | Status: AC
  Administered 2012-02-03: 1 mg/min via INTRAVENOUS
  Filled 2012-02-03: qty 200

## 2012-02-03 MED ORDER — WARFARIN SODIUM 5 MG PO TABS
5.0000 mg | ORAL_TABLET | Freq: Once | ORAL | Status: DC
  Filled 2012-02-03: qty 1

## 2012-02-03 MED ORDER — SODIUM CHLORIDE 0.9 % IJ SOLN
10.0000 mL | Freq: Two times a day (BID) | INTRAMUSCULAR | Status: DC
Start: 2012-02-03 — End: 2012-02-12
  Administered 2012-02-05 – 2012-02-08 (×6): 10 mL
  Administered 2012-02-09: 20 mL
  Administered 2012-02-10 – 2012-02-11 (×2): 30 mL

## 2012-02-03 MED ORDER — HEPARIN (PORCINE) IN NACL 100-0.45 UNIT/ML-% IJ SOLN
950.0000 [IU]/h | INTRAMUSCULAR | Status: DC
  Filled 2012-02-03: qty 250

## 2012-02-03 MED ORDER — SODIUM CHLORIDE 0.9 % IV BOLUS (SEPSIS): 250.0000 mL | Freq: Once | INTRAVENOUS | Status: DC

## 2012-02-03 MED ORDER — SODIUM CHLORIDE 0.9 % IJ SOLN
10.0000 mL | INTRAMUSCULAR | Status: DC | PRN
  Administered 2012-02-08 – 2012-02-10 (×2): 10 mL
  Administered 2012-02-11: 20 mL
  Administered 2012-02-12: 30 mL

## 2012-02-03 MED ORDER — HEPARIN (PORCINE) IN NACL 100-0.45 UNIT/ML-% IJ SOLN
1150.0000 [IU]/h | INTRAMUSCULAR | Status: DC
  Administered 2012-02-03: 950 [IU]/h via INTRAVENOUS
  Administered 2012-02-04 – 2012-02-07 (×4): 1150 [IU]/h via INTRAVENOUS
  Filled 2012-02-03 (×10): qty 250

## 2012-02-03 MED ORDER — SODIUM CHLORIDE 0.9 % IV SOLN
INTRAVENOUS | Status: DC
  Administered 2012-02-03: 10 mL via INTRAVENOUS
  Administered 2012-02-06: 23:00:00 via INTRAVENOUS

## 2012-02-03 NOTE — Progress Notes (Signed)
Peripherally Inserted Central Catheter/Midline Placement  The IV Nurse has discussed with the patient and/or persons authorized to consent for the patient, the purpose of this procedure and the potential benefits and risks involved with this procedure.  The benefits include less needle sticks, lab draws from the catheter and patient may be discharged home with the catheter.  Risks include, but not limited to, infection, bleeding, blood clot (thrombus formation), and puncture of an artery; nerve damage and irregular heat beat.  Alternatives to this procedure were also discussed. Family present  PICC/Midline Placement Documentation        [name] 02/03/2012, 3:33 PM

## 2012-02-03 NOTE — Progress Notes (Signed)
Called by primary RN to assess patient for trouble getting words out.  Upon arrival to patients room Rn and family at bedside. NIHSS 2 due to arm weakness on right side.   Patient stays she feels something is wrong.  RN updated and notified MD, to come see patient.  Cardiology Md at bedside,patient now with slurred speech, spoke to Dr. Madilyn Fireman transported to CT on monitor and oxygen without incidence.  Rn to call if assistance needed

## 2012-02-03 NOTE — Progress Notes (Signed)
Patient ID: [name], female   DOB: [birth year], 78 y.o.   MRN: [id number]   On-call cardiology note  Patient with CHF EF 25%, AFIB with normal ventricular response rate (85bpm) now with slurred speech that started about 1.5 hours ago. She is on Coumadin but INR 1.35.  BP 108/72 HR 85 bpm. Exam: patient has slurred speech with no gross neuro deficit.  I spoke with Neurology (Dr. Noel Christmas) who will perform a neuro consult after STAT non-contrast CT head which has been ordered.  Gemma Payor, M.D.

## 2012-02-03 NOTE — Progress Notes (Signed)
02/03/12 1653 02/03/12 1657 02/03/12 1700  Vitals  Temp 97.8 F (36.6 C) --  --   Temp src Oral --  --   BP ! 116/58 mmHg --  --   MAP (mmHg) --  82  --   BP Location Left arm --  --   BP Method Automatic --  --   Pulse Rate 71  --  --   Pulse Rate Source Dinamap --  --   ECG Heart Rate --  --  100    Pt family alerted Rn to pt inability to speak. Grip strength equal bilat. Facial symmetry present. PERL. Pt responds she is having difficulty speaking words properly. Rapid response RN called to bedside.Will continue to monitor and advise attending as needed.

## 2012-02-03 NOTE — Progress Notes (Signed)
Patient: [name] Date of Encounter: 02/03/2012, 10:34 AM Admit date: 01/29/2012     Subjective   [name] is a 78yo female with PMHx s/f idiopathic dilated cardiomyopathy (11/12 echo: LVEF 20-25%, diffuse HK, moderate MR, severe LAE, PFO, mod TR, PASP ), PAF (on chronic Coumadin), symptomatic bradycardia (s/p CRT-D->CRT-P revision 03/12), CKD (stage III) and history of esophageal stricture who was admitted on 11/18 w/ acute esophageal obstruction  Underwent esoph dilation 11/21. Yesterday developed respiratory distress requiring bipap. With hypotension with BPs in 80s. ICD interrogated and noted to have episodes of AF underlying CRT (apparently only 2% of total rhythm). I do not see CXR on chart since 11/20.   SBP remains soft in 90s. Cr up 1.3 -> 2.1. Had several episodes asymptomatic NSVT last night.  Telemetry reviewed with Dr. Graciela Husbands. Looks like mostly AF/AFL with rates between 80-110. + episodes NSVT.     Objective  Physical Exam: Vitals: BP 99/64  Pulse 108  Temp 98.2 F (36.8 C) (Oral)  Resp 20  Ht 5\' 2"  (1.575 m)  Wt 72.1 kg (158 lb 15.2 oz)  BMI 29.07 kg/m2  SpO2 96% General: Well developed, elderly 78 year old female in no acute distress. Neck: Supple. JVD not elevated. Lungs: Clear bilaterally to auscultation without wheezes, rales, or rhonchi. Breathing is unlabored. Heart: Irreg S1 S2 without murmurs, rubs, or gallops.  Abdomen: Soft, non-distended. Extremities: No clubbing or cyanosis. Tr edema.  Distal pedal pulses are 2+ and equal bilaterally. Neuro: Alert and oriented X 3. Moves all extremities spontaneously. No focal deficits.  Intake/Output:  Intake/Output Summary (Last 24 hours) at 02/03/12 1034 Last data filed at 02/03/12 0854  Gross per 24 hour  Intake   1040 ml  Output    100 ml  Net    940 ml    Inpatient Medications:     . amiodarone  100 mg Oral Daily  . cefTRIAXone (ROCEPHIN)  IV  1 g Intravenous Q24H  . digoxin  0.0625  mg Oral Daily  . irbesartan  37.5 mg Oral QHS  . levothyroxine  100 mcg Oral Daily  . metoprolol tartrate  25 mg Oral Q8H  . spironolactone  12.5 mg Oral Daily  . [COMPLETED] warfarin  6 mg Oral ONCE-1800  . Warfarin - Physician Dosing Inpatient   Does not apply q1800      . 0.9 % NaCl with KCl 20 mEq / L 30 mL/hr at 01/31/12 0717    Labs:  [facility] 02/03/12 0515 02/02/12 0845 02/02/12 0555  NA 137 139 --  K 3.2* 3.2* --  CL 98 101 --  CO2 26 26 --  GLUCOSE 120* 110* --  BUN 45* 33* --  CREATININE 2.10* 1.34* --  CALCIUM 8.7 8.8 --  MG -- -- 1.7  PHOS -- -- --    Basename 02/02/12 0555 02/01/12 0159  WBC 13.0* 14.7*  NEUTROABS -- --  HGB 11.6* 11.2*  HCT 34.6* 34.0*  MCV 100.3* 99.1  PLT 214 215    Basename 02/01/12 0159 01/31/12 2005 01/31/12 1555  CKTOTAL -- -- --  CKMB -- -- --  TROPONINI <0.30 <0.30 <0.30    Basename 02/03/12 0515  INR 1.35    Radiology/Studies: Abd 1 View (kub)  01/29/2012  *RADIOLOGY REPORT*  Clinical Data: Nausea, vomiting  ABDOMEN - 1 VIEW  Comparison: 04/30/2008  Findings: Right hip arthroplasty components partially seen.  There is a mild dextroscoliosis of the lumbar spine with multilevel  degenerative changes. Small bowel decompressed.  There is a normal distribution of gas and stool throughout the colon and distending the rectum.  Visualized lung bases clear.  Bilateral pelvic phleboliths.  IMPRESSION:  Nonobstructive bowel gas pattern   Original Report Authenticated By: D. Andria Rhein, MD    Dg Chest [place] 1v Same Day  01/31/2012  *RADIOLOGY REPORT*  Clinical Data: Shortness of breath  PORTABLE CHEST - 1 VIEW SAME DAY  Comparison: 06/10/2010  Findings: Left subclavian AICD stable position.  Stable cardiomegaly.  New asymmetric airspace opacities, centrally throughout much of the right lung, and to a lesser degree at the left lung base.  No definite effusion.  Atheromatous aorta. Degenerative changes of bilateral shoulders.  IMPRESSION:   1.  Asymmetric airspace infiltrates or edema, right greater than left. 2.  Stable cardiomegaly and postop changes.   Original Report Authenticated By: D. Andria Rhein, MD     Echocardiogram: Study Conclusions - Left ventricle: LVEF is severely depressed at approximately 15 to 25% with akinesisinferoseptum, posterior wall, inferior wall and apex; severe hypokinesis elsewhere. The cavity size was severely dilated. Wall thickness was normal. - Aortic valve: Mild regurgitation. - Mitral valve: Moderate regurgitation. - Left atrium: Suspicious for small PFO by color doppler The atrium was moderately to severely dilated. - Pulmonary arteries: PA peak pressure: 40mm Hg (S).  Telemetry: AV paced predominantly; occasional PVCs, NSVT    Assessment  1. NICM, EF 20%, s/p CRT-P   2. Acute on chronic systolic HF  3. PAFib/FL with RVR 4. NSVT - asymptomatic;  5. A/c renal failure 6. Hypotension 7. Acute respiratory failure - improved 8. Esophageal stricture s/p dialtion   Assessment and nd  Plan/Discussion  Pla  She is somewhat tenuous with low BP and worsening renal function. I suspect this may be due to her AF and also mild volume depletion (weight down 20 pounds over past year).   With low EF will be very careful with IVF. Start with 250 cc bolus. Check CXR.   Change po amio to IV to help with rate control. Will likely need TEE/DC-CV on Monday. (Will discuss with GI to make sure TEE ok after recent dilation - I suspect it will be ok). Continue coumadin. Hold lopressor for now.  Will transfer to 2900 SDU for closer monitoring. Place PICC.   Discussed with Drs. Jarold Motto and Graciela Husbands as well as multiple family members at bedside. Total time spent 50 minutes.   Macil Crady,MD 11:13 AM

## 2012-02-03 NOTE — Consult Note (Signed)
Referring Physician: Dr. Patriciaann Clan    Chief Complaint: New-onset slurred speech and expressive aphasia.  HPI: [name] is an 78 y.o. female with a history of atrial fibrillation, diabetes mellitus, hypertension, hyperlipidemia and nonischemic cardiomyopathy, who was admitted on 01/29/2012 for workup of upper GI dysfunction including endoscopy. Patient has been on Coumadin for anticoagulation. Coumadin was suspended for endoscopy been restarted following the procedure. Neurology consultation was obtained because of new onset of slurred speech as well as expressive aphasia with waxing and waning of severity. Patient was last seen normal at 3:30 PM today. CT scan of her head showed no acute intracranial abnormality. Patient had no clinical history of previous stroke nor TIA. At the time of my evaluation she was beyond time window for consideration for intravenous thrombolytic therapy with TPA. Radiology intervention was discussed with family members. They were not in favor of aggressive diagnostic studies. INR was 1.3. Heparin bridge with low-dose heparin drip was elected. NIH stroke score was 4 with expressive aphasia and dysarthria.  LSN: 3:30 PM today tPA Given: No: Beyond time window for treatment consideration MRankin: 2  Past Medical History  Diagnosis Date  . Benign neoplasm of colon   . Diverticulosis of colon (without mention of hemorrhage)   . Reflux esophagitis   . Gastric polyp     history of  . GERD (gastroesophageal reflux disease)   . Stricture and stenosis of esophagus   . Reactive airway disease   . Anemia, iron deficiency   . CHF (congestive heart failure)   . Restless leg syndrome   . Hypercholesterolemia   . Nonischemic cardiomyopathy     EF 20%  . Personal history of sudden cardiac death successfully resuscitated     status post cardioverter- defibrillator  . Atrial fibrillation   . Diabetes mellitus   . Osteoarthritis   . Hypertension   . UTI (lower  urinary tract infection)   . v45.02     History reviewed. No pertinent family history.   Medications:  Scheduled:   . amiodarone  150 mg Intravenous Once  . cefTRIAXone (ROCEPHIN)  IV  1 g Intravenous Q24H  . digoxin  0.0625 mg Oral Daily  . levothyroxine  100 mcg Oral Daily  . sodium chloride  250 mL Intravenous Once  . sodium chloride  250 mL Intravenous Once  . sodium chloride  10-40 mL Intracatheter Q12H  . warfarin  5 mg Oral ONCE-1800  . Warfarin - Physician Dosing Inpatient   Does not apply q1800  . [DISCONTINUED] amiodarone  100 mg Oral Daily  . [DISCONTINUED] irbesartan  37.5 mg Oral QHS  . [DISCONTINUED] metoprolol tartrate  25 mg Oral Q8H  . [DISCONTINUED] spironolactone  12.5 mg Oral Daily   WGN:[id number], acetaminophen, bisacodyl, diclofenac sodium, HYDROcodone-acetaminophen, morphine injection, ondansetron (ZOFRAN) IV, ondansetron, sodium chloride, zolpidem   Physical Examination: Blood pressure 116/58, pulse 71, temperature 98.2 F (36.8 C), temperature source Oral, resp. rate 16, height 5\' 2"  (1.575 m), weight 72.1 kg (158 lb 15.2 oz), last menstrual period 01/10/2012, SpO2 98.00%.  Neurologic Examination: Mental Status: Moderately dysarthric speech with moderately severe expressive aphasia. Able to follow commands without difficulty. Cranial Nerves: II-Visual fields were normal. III/IV/VI-Pupils were equal and reacted. Extraocular movements were full and conjugate.    V/VII-no facial numbness and no facial weakness. VIII-normal. X-moderately severe dysarthria XII-midline tongue extension Motor: 5/5 bilaterally with normal tone and bulk Sensory: Normal throughout. Deep Tendon Reflexes: 1+ and symmetric. Plantars: Mute bilaterally Cerebellar: Normal finger-to-nose  testing.  Ct Head Wo Contrast  02/03/2012  *RADIOLOGY REPORT*  Clinical Data: 78 year old female with atrial fibrillation on Coumadin, subtherapeutic, slurred speech.  CT HEAD WITHOUT  CONTRAST  Technique:  Contiguous axial images were obtained from the base of the skull through the vertex without contrast.  Comparison: Head CTs 10/14/2011 and earlier.  Findings: Visualized paranasal sinuses and mastoids are clear.  No acute orbit or scalp soft tissue findings. No acute osseous abnormality identified.  Calcified atherosclerosis at the skull base.  Stable cerebral volume.  No ventriculomegaly.  Cortical encephalomalacia in the anterior right frontal lobe on image 19 seems increased, but appears chronic. Patchy and confluent periventricular and subcortical white matter hypodensity in the cerebral hemispheres has not significantly changed.  No acute cortically based infarct identified. No midline shift, mass effect, or evidence of mass lesion.  No acute intracranial hemorrhage identified.  No suspicious intracranial vascular hyperdensity.  IMPRESSION: No acute cortically based infarct or intracranial hemorrhage identified.  Interval progression of focal right frontal lobe encephalomalacia, but appears chronic.  Chronically advanced white matter disease.   Original Report Authenticated By: Erskine Speed, M.D.    Dg Chest Port 1 View  02/03/2012  *RADIOLOGY REPORT*  Clinical Data: CHF and status post PICC line placement.  PORTABLE CHEST - 1 VIEW  Comparison: 1235 hours  Findings: Portable film at 1603 hours demonstrates interval placement of a right upper extremity PICC line.  The tip is located in the distal SVC.  Lungs show relatively stable CHF.  IMPRESSION: PICC line tip is in the lower SVC.   Original Report Authenticated By: Irish Lack, M.D.    Dg Chest Port 1 View  02/03/2012  *RADIOLOGY REPORT*  Clinical Data: Dyspnea.  PORTABLE CHEST - 1 VIEW  Comparison: 01/31/2012  Findings: Pacer / AICD device.  Mild cardiomegaly.  Probable small bilateral pleural effusions. No pneumothorax.  Patchy interstitial and airspace disease is improved on the right.  Mild left base volume loss.  Glenohumeral joint osteoarthritis bilaterally.  IMPRESSION: Improved right-sided aeration.  Either infection or asymmetric pulmonary edema.  Cardiomegaly with possible small bilateral pleural effusions.   Original Report Authenticated By: Jeronimo Greaves, M.D.     Assessment: 78 y.o. female with acute onset of dysarthria and expressive aphasia with waxing and waning severity of deficits, and subtherapeutic INR on Coumadin. Patient was beyond time window for IV TPA and family elected not to proceed with Interventional Radiology evaluation.  Stroke Risk Factors - atrial fibrillation, diabetes mellitus, hyperlipidemia and hypertension  Plan: 1. HgbA1c, fasting lipid panel 2. MRI, MRA  of the brain without contrast 3. PT consult, OT consult, Speech consult 4. Carotid dopplers 5. Prophylactic therapy-Anticoagulation: Coumadin as well as low-dose heparin drip until INR is therapeutic. 6. Risk factor modification 7. Telemetry monitoring  C.R. Roseanne Reno, MD [facility] [phone]  02/03/2012, 8:19 PM

## 2012-02-03 NOTE — Progress Notes (Addendum)
ANTICOAGULATION CONSULT NOTE - Initial Consult  Pharmacy Consult for Heparin Indication: atrial fibrillation  Allergies  Allergen Reactions  . Iodinated Diagnostic Agents     Unknown    Patient Measurements: Height: 5\' 2"  (157.5 cm) Weight: 158 lb 15.2 oz (72.1 kg) (bed weight/ pt to wak to stand) IBW/kg (Calculated) : 50.1  Heparin Dosing Weight: 65.5 kg  Vital Signs: Temp: 97.8 F (36.6 C) (11/23 1653) Temp src: Oral (11/23 1653) BP: 116/58 mmHg (11/23 1653) Pulse Rate: 71  (11/23 1653)  Labs:  [name] 02/03/12 0515 02/02/12 0845 02/02/12 0555 02/01/12 1808 02/01/12 0159 01/31/12 2005  HGB -- -- 11.6* -- 11.2* --  HCT -- -- 34.6* -- 34.0* --  PLT -- -- 214 -- 215 --  APTT -- -- -- -- -- --  LABPROT 16.4* -- -- -- 16.1* --  INR 1.35 -- -- -- 1.32 --  HEPARINUNFRC -- -- -- -- -- --  CREATININE 2.10* 1.34* -- 1.40* -- --  CKTOTAL -- -- -- -- -- --  CKMB -- -- -- -- -- --  TROPONINI -- -- -- -- <0.30 <0.30    Estimated Creatinine Clearance: 18.9 ml/min (by C-G formula based on Cr of 2.1).   Medical History: Past Medical History  Diagnosis Date  . Benign neoplasm of colon   . Diverticulosis of colon (without mention of hemorrhage)   . Reflux esophagitis   . Gastric polyp     history of  . GERD (gastroesophageal reflux disease)   . Stricture and stenosis of esophagus   . Reactive airway disease   . Anemia, iron deficiency   . CHF (congestive heart failure)   . Restless leg syndrome   . Hypercholesterolemia   . Nonischemic cardiomyopathy     EF 20%  . Personal history of sudden cardiac death successfully resuscitated     status post cardioverter- defibrillator  . Atrial fibrillation   . Diabetes mellitus   . Osteoarthritis   . Hypertension   . UTI (lower urinary tract infection)   . v45.02     Assessment: 78 y/o female known to pharmacy from Coumadin dosing. Pharmacy consulted to begin heparin drip while INR <2 for symptomatic Afib with stuttering  aphasia.   Goal of Therapy:  Heparin level 0.3-0.7 units/ml Monitor platelets by anticoagulation protocol: Yes   Plan:  -Begin heparin drip at 950 units/hr with no bolus -Heparin level 8 hours after started -Daily heparin level and CBC while on heparin  [facility], [place].D., BCPS Clinical Pharmacist Pager: [phone] 02/03/2012 7:04 PM   Addendum: Dr. Roseanne Reno called and gave a verbal order to hold off on the heparin for now.  [place], [address].D., BCPS Clinical Pharmacist Pager: [phone] 02/03/2012 7:17 PM   Addendum: Dr. Roseanne Reno called the pharmacy and gave a verbal order to begin the heparin drip.  [place], [address].D., BCPS Clinical Pharmacist Pager: [phone] 02/03/2012 7:28 PM

## 2012-02-03 NOTE — Consult Note (Signed)
ANTICOAGULATION CONSULT NOTE - Initial Consult  Pharmacy Consult for Coumadin Indication: atrial fibrillation  Allergies  Allergen Reactions  . Iodinated Diagnostic Agents     Unknown    Patient Measurements: Height: 5\' 2"  (157.5 cm) Weight: 158 lb 15.2 oz (72.1 kg) (bed weight/ pt to wak to stand) IBW/kg (Calculated) : 50.1   Vital Signs: Temp: 98.2 F (36.8 C) (11/23 0309) Temp src: Oral (11/23 0309) BP: 99/64 mmHg (11/23 1020) Pulse Rate: 108  (11/23 1020)  Labs:  Alvira Philips 02/03/12 0515 02/02/12 0845 02/02/12 0555 02/01/12 1808 02/01/12 0159 01/31/12 2005 01/31/12 1555  HGB -- -- 11.6* -- 11.2* -- --  HCT -- -- 34.6* -- 34.0* -- --  PLT -- -- 214 -- 215 -- --  APTT -- -- -- -- -- -- --  LABPROT 16.4* -- -- -- 16.1* -- --  INR 1.35 -- -- -- 1.32 -- --  HEPARINUNFRC -- -- -- -- -- -- --  CREATININE 2.10* 1.34* -- 1.40* -- -- --  CKTOTAL -- -- -- -- -- -- --  CKMB -- -- -- -- -- -- --  TROPONINI -- -- -- -- <0.30 <0.30 <0.30    Estimated Creatinine Clearance: 18.9 ml/min (by C-G formula based on Cr of 2.1).   Medical History: Past Medical History  Diagnosis Date  . Benign neoplasm of colon   . Diverticulosis of colon (without mention of hemorrhage)   . Reflux esophagitis   . Gastric polyp     history of  . GERD (gastroesophageal reflux disease)   . Stricture and stenosis of esophagus   . Reactive airway disease   . Anemia, iron deficiency   . CHF (congestive heart failure)   . Restless leg syndrome   . Hypercholesterolemia   . Nonischemic cardiomyopathy     EF 20%  . Personal history of sudden cardiac death successfully resuscitated     status post cardioverter- defibrillator  . Atrial fibrillation   . Diabetes mellitus   . Osteoarthritis   . Hypertension   . UTI (lower urinary tract infection)   . v45.02    Assessment: 85yof on coumadin pta (2mg  daily except 4mg  on Thursday) for afib. Has a history of GERD with esophageal stricture and admitted  with poor PO intake/dysphagia. INR 5.23 on admission and coumadin placed on hold for possible GI procedures.  11/18 INR 5.23 11/19 INR 4.62, Vit K 10mg  SQ x 1 11/20 INR 3.01 Vit K 5mg  IV x 1 11/21 INR 1.32  She is now s/p endoscopy and resumed coumadin. INR 1.35, will likely require higher doses initially to overcome vitamin k resistance. Likely for TEE/DCCV Monday. No bleeding per chart.  Goal of Therapy:  INR 2-3 Monitor platelets by anticoagulation protocol: Yes   Plan:  1) Coumadin 5mg  x 1 2) Follow up INR in AM  Riki Rusk 02/03/2012,1:08 PM

## 2012-02-03 NOTE — Progress Notes (Signed)
Subjective: She feels very mild today, with dyspnea with any activity. She was able to eat some normal food today, decided against trip home for meal with multiple outside family members today. We discussed eventual post discharge care and patient and family in agreement with plan for SNF rehab when her condition stabilizes.   Objective: Vital signs in last 24 hours: Temp:  [97.5 F (36.4 C)-98.2 F (36.8 C)] 98.2 F (36.8 C) (11/23 0309) Pulse Rate:  [101-108] 108  (11/23 1020) Resp:  [18-20] 20  (11/23 1020) BP: (79-99)/(48-67) 99/64 mmHg (11/23 1020) SpO2:  [96 %-99 %] 96 % (11/23 0445) Weight change:    Intake/Output from previous day: 11/22 0701 - 11/23 0700 In: 800 [P.O.:750; IV Piggyback:50] Out: 300 [Urine:300]   General appearance: alert and no distress, Chest was clear to auscultation, Heart had an irregularly irregular rhythm with a 2/6 SEM, abdomen was benign, she was alert and well oriented with a normal affect.   Lab Results:  Basename 02/02/12 0555 02/01/12 0159  WBC 13.0* 14.7*  HGB 11.6* 11.2*  HCT 34.6* 34.0*  PLT 214 215   BMET  Basename 02/03/12 0515 02/02/12 0845  NA 137 139  K 3.2* 3.2*  CL 98 101  CO2 26 26  GLUCOSE 120* 110*  BUN 45* 33*  CREATININE 2.10* 1.34*  CALCIUM 8.7 8.8   CMET CMP     Component Value Date/Time   NA 137 02/03/2012 0515   K 3.2* 02/03/2012 0515   CL 98 02/03/2012 0515   CO2 26 02/03/2012 0515   GLUCOSE 120* 02/03/2012 0515   BUN 45* 02/03/2012 0515   CREATININE 2.10* 02/03/2012 0515   CALCIUM 8.7 02/03/2012 0515   PROT 6.1 01/29/2012 1849   ALBUMIN 2.7* 01/29/2012 1849   AST 25 01/29/2012 1849   ALT 35 01/29/2012 1849   ALKPHOS 175* 01/29/2012 1849   BILITOT 0.9 01/29/2012 1849   GFRNONAA 21* 02/03/2012 0515   GFRAA 24* 02/03/2012 0515    CBG (last 3)   Basename 02/03/12 0758 02/02/12 2102 02/02/12 1612  GLUCAP 139* 120* 138*    INR RESULTS:   Lab Results  Component Value Date   INR 1.35  02/03/2012   INR 1.32 02/01/2012   INR 3.01* 01/31/2012     Studies/Results: No results found.  Medications: I have reviewed the patient's current medications.  Assessment/Plan: #1 Esophageal Stricture: much improved after dilatation and tolerating foods well. #2 DM-2: stable blood sugar levels #3 Acute on Chronic Renal Insufficiency: likely due to hypotension in the face of ARB treatment. Agree with plans for CHF treatment and adjustment of meds per cardiologist.  #4 CHF: moderate sxs likely due to afib in the face of dilated cardiomyopathy. Likely to have TEE guided cardioversion.  LOS: 5 days   [name] G 02/03/2012, 11:29 AM

## 2012-02-04 ENCOUNTER — Encounter (HOSPITAL_COMMUNITY): Payer: Self-pay | Admitting: *Deleted

## 2012-02-04 DIAGNOSIS — G459 Transient cerebral ischemic attack, unspecified: Secondary | ICD-10-CM | POA: Diagnosis not present

## 2012-02-04 DIAGNOSIS — I6350 Cerebral infarction due to unspecified occlusion or stenosis of unspecified cerebral artery: Secondary | ICD-10-CM

## 2012-02-04 LAB — CBC
MCV: 98.9 fL (ref 78.0–100.0)
Platelets: 213 10*3/uL (ref 150–400)
RBC: 3.57 MIL/uL — ABNORMAL LOW (ref 3.87–5.11)
WBC: 14.5 10*3/uL — ABNORMAL HIGH (ref 4.0–10.5)

## 2012-02-04 LAB — BASIC METABOLIC PANEL
BUN: 38 mg/dL — ABNORMAL HIGH (ref 6–23)
CO2: 25 mEq/L (ref 19–32)
CO2: 24 mEq/L (ref 19–32)
Calcium: 8.8 mg/dL (ref 8.4–10.5)
Chloride: 98 mEq/L (ref 96–112)
GFR calc non Af Amer: 34 mL/min — ABNORMAL LOW (ref >90)
Glucose, Bld: 118 mg/dL — ABNORMAL HIGH (ref 70–99)
Potassium: 2.9 mEq/L — ABNORMAL LOW (ref 3.5–5.1)
Potassium: 4.3 mEq/L (ref 3.5–5.1)
Sodium: 134 mEq/L — ABNORMAL LOW (ref 135–145)
Sodium: 133 mEq/L — ABNORMAL LOW (ref 135–145)

## 2012-02-04 LAB — PROTIME-INR
INR: 1.5 — ABNORMAL HIGH (ref 0.00–1.49)
Prothrombin Time: 17.7 seconds — ABNORMAL HIGH (ref 11.6–15.2)

## 2012-02-04 LAB — CARBOXYHEMOGLOBIN
Carboxyhemoglobin: 1.3 % (ref 0.5–1.5)
O2 Saturation: 68.9 %

## 2012-02-04 LAB — GLUCOSE, CAPILLARY: Glucose-Capillary: 98 mg/dL (ref 70–99)

## 2012-02-04 MED ORDER — POTASSIUM CHLORIDE 10 MEQ/50ML IV SOLN
10.0000 meq | INTRAVENOUS | Status: AC
  Administered 2012-02-04 (×4): 10 meq via INTRAVENOUS
  Filled 2012-02-04: qty 50
  Filled 2012-02-04: qty 100

## 2012-02-04 MED ORDER — POTASSIUM CHLORIDE 10 MEQ/50ML IV SOLN
INTRAVENOUS | Status: AC
  Administered 2012-02-04: 10 meq
  Filled 2012-02-04: qty 50

## 2012-02-04 MED ORDER — POTASSIUM CHLORIDE 10 MEQ/100ML IV SOLN
10.0000 meq | INTRAVENOUS | Status: AC
  Administered 2012-02-04: 10 meq via INTRAVENOUS

## 2012-02-04 MED ORDER — WARFARIN SODIUM 4 MG PO TABS
4.0000 mg | ORAL_TABLET | Freq: Once | ORAL | Status: AC
  Administered 2012-02-04: 4 mg via ORAL
  Filled 2012-02-04: qty 1

## 2012-02-04 MED ORDER — POTASSIUM CHLORIDE 10 MEQ/100ML IV SOLN: 10.0000 meq | INTRAVENOUS | Status: DC

## 2012-02-04 MED ORDER — POTASSIUM CHLORIDE 10 MEQ/100ML IV SOLN
INTRAVENOUS | Status: AC
  Filled 2012-02-04: qty 100

## 2012-02-04 MED ORDER — POTASSIUM CHLORIDE 10 MEQ/100ML IV SOLN
INTRAVENOUS | Status: AC
  Administered 2012-02-04: 10 meq
  Filled 2012-02-04: qty 100

## 2012-02-04 NOTE — Progress Notes (Signed)
Subjective: Events of last evening noted. Her speech difficulties have resolved entirely. She continues to have pervasive weakness and dyspnea with any activity other than complete bedrest.  Objective: Vital signs in last 24 hours: Temp:  [97.5 F (36.4 C)-98.2 F (36.8 C)] 98.1 F (36.7 C) (11/24 0800) Pulse Rate:  [71-108] 91  (11/24 0700) Resp:  [16-23] 20  (11/24 0700) BP: (87-119)/(42-78) 98/61 mmHg (11/24 0700) SpO2:  [94 %-98 %] 98 % (11/24 0700) Weight:  [73.7 kg (162 lb 7.7 oz)] 73.7 kg (162 lb 7.7 oz) (11/24 0400) Weight change:    Intake/Output from previous day: 11/23 0701 - 11/24 0700 In: 1109.4 [P.O.:240; I.V.:419.4; IV Piggyback:450] Out: 100 [Urine:100]   General appearance: alert, cooperative and no distress Resp: clear to auscultation bilaterally Cardio: irregularly irregular rhythm Neurologic: Grossly normal Today I had her slowly swallow water while sitting up which she did with no difficulty and no coughing.  Lab Results:  Basename 02/04/12 0400 02/02/12 0555  WBC 14.5* 13.0*  HGB 12.1 11.6*  HCT 35.3* 34.6*  PLT 213 214   BMET  Basename 02/04/12 0400 02/03/12 0515  NA 134* 137  K 2.9* 3.2*  CL 98 98  CO2 25 26  GLUCOSE 105* 120*  BUN 41* 45*  CREATININE 1.52* 2.10*  CALCIUM 8.8 8.7   CMET CMP     Component Value Date/Time   NA 134* 02/04/2012 0400   K 2.9* 02/04/2012 0400   CL 98 02/04/2012 0400   CO2 25 02/04/2012 0400   GLUCOSE 105* 02/04/2012 0400   BUN 41* 02/04/2012 0400   CREATININE 1.52* 02/04/2012 0400   CALCIUM 8.8 02/04/2012 0400   PROT 6.1 01/29/2012 1849   ALBUMIN 2.7* 01/29/2012 1849   AST 25 01/29/2012 1849   ALT 35 01/29/2012 1849   ALKPHOS 175* 01/29/2012 1849   BILITOT 0.9 01/29/2012 1849   GFRNONAA 31* 02/04/2012 0400   GFRAA 35* 02/04/2012 0400    CBG (last 3)   Basename 02/04/12 0801 02/03/12 2113 02/03/12 1639  GLUCAP 113* 101* 123*    INR RESULTS:   Lab Results  Component Value Date   INR  1.50* 02/04/2012   INR 1.35 02/03/2012   INR 1.32 02/01/2012     Studies/Results: Ct Head Wo Contrast  02/03/2012  *RADIOLOGY REPORT*  Clinical Data: 78 year old female with atrial fibrillation on Coumadin, subtherapeutic, slurred speech.  CT HEAD WITHOUT CONTRAST  Technique:  Contiguous axial images were obtained from the base of the skull through the vertex without contrast.  Comparison: Head CTs 10/14/2011 and earlier.  Findings: Visualized paranasal sinuses and mastoids are clear.  No acute orbit or scalp soft tissue findings. No acute osseous abnormality identified.  Calcified atherosclerosis at the skull base.  Stable cerebral volume.  No ventriculomegaly.  Cortical encephalomalacia in the anterior right frontal lobe on image 19 seems increased, but appears chronic. Patchy and confluent periventricular and subcortical white matter hypodensity in the cerebral hemispheres has not significantly changed.  No acute cortically based infarct identified. No midline shift, mass effect, or evidence of mass lesion.  No acute intracranial hemorrhage identified.  No suspicious intracranial vascular hyperdensity.  IMPRESSION: No acute cortically based infarct or intracranial hemorrhage identified.  Interval progression of focal right frontal lobe encephalomalacia, but appears chronic.  Chronically advanced white matter disease.   Original Report Authenticated By: Erskine Speed, M.D.    Dg Chest Port 1 View  02/03/2012  *RADIOLOGY REPORT*  Clinical Data: CHF and status post PICC line  placement.  PORTABLE CHEST - 1 VIEW  Comparison: 1235 hours  Findings: Portable film at 1603 hours demonstrates interval placement of a right upper extremity PICC line.  The tip is located in the distal SVC.  Lungs show relatively stable CHF.  IMPRESSION: PICC line tip is in the lower SVC.   Original Report Authenticated By: Irish Lack, M.D.    Dg Chest Port 1 View  02/03/2012  *RADIOLOGY REPORT*  Clinical Data: Dyspnea.   PORTABLE CHEST - 1 VIEW  Comparison: 01/31/2012  Findings: Pacer / AICD device.  Mild cardiomegaly.  Probable small bilateral pleural effusions. No pneumothorax.  Patchy interstitial and airspace disease is improved on the right.  Mild left base volume loss. Glenohumeral joint osteoarthritis bilaterally.  IMPRESSION: Improved right-sided aeration.  Either infection or asymmetric pulmonary edema.  Cardiomegaly with possible small bilateral pleural effusions.   Original Report Authenticated By: Jeronimo Greaves, M.D.     Medications: I have reviewed the patient's current medications.  Assessment/Plan: #1 Esophageal Obstruction: resolved #2 Expressive Aphasia: entirely resolved with no evidence of dysphagia. Most likely she had a TIA. Will resume normal diet. #3 CHF:  Chronic compensated systolic CHF exacerbated by atrial fibrillation. Agree with plans for eventual TEE guided cardioversion.  LOS: 6 days   [name] 02/04/2012, 9:26 AM

## 2012-02-04 NOTE — Evaluation (Signed)
Clinical/Bedside Swallow Evaluation Patient Details  Name: [name] MRN: [id number] Date of Birth: 1926-12-23  Today's Date: 02/04/2012 Time: 1730-1800 SLP Time Calculation (min): 30 min  Past Medical History:  Past Medical History  Diagnosis Date  . Benign neoplasm of colon   . Diverticulosis of colon (without mention of hemorrhage)   . Reflux esophagitis   . Gastric polyp     history of  . GERD (gastroesophageal reflux disease)   . Stricture and stenosis of esophagus   . Reactive airway disease   . Anemia, iron deficiency   . CHF (congestive heart failure)   . Restless leg syndrome   . Nonischemic cardiomyopathy     EF 20-25% 01-23-12 / BiV AICD  . Personal history of sudden cardiac death successfully resuscitated     status post cardioverter- defibrillator  . Atrial fibrillation   . Diabetes mellitus   . Osteoarthritis   . Hypertension   . UTI (lower urinary tract infection)   . v45.02   . Chronic kidney disease (CKD), stage III (moderate)   . Hyperlipemia   . GERD with stricture   . Anticoagulated on Coumadin    Past Surgical History:  Past Surgical History  Procedure Date  . Cardiac defibrillator placement 1999, 2001, 3.30.2012    [facility], Removal of previously implanted BiV ICD followed by insertion of BiV pacemaker with pacemaker pocket revision. without immediate procedural complications.   . Eye surgery 2002    cataract   . Total hip arthroplasty 2004    right  . Insert / replace / remove pacemaker   . Joint replacement   . Esophagogastroduodenoscopy (egd) with esophageal dilation 01/31/2012    Procedure: ESOPHAGOGASTRODUODENOSCOPY (EGD) WITH ESOPHAGEAL DILATION;  Surgeon: Rachael Fee, MD;  Location[facility] ENDOSCOPY;  Service: Endoscopy;  Laterality: N/A;  . Esophagogastroduodenoscopy 02/01/2012    Procedure: ESOPHAGOGASTRODUODENOSCOPY (EGD);  Surgeon: Rachael Fee, MD;  Location: [facility] ENDOSCOPY;  Service: Endoscopy;  Laterality: N/A;    HPI:  78 y/o female with history of atrial fibrillation, DM, HTN, hyperlipidemia, and non ischemic cardiomyopathy was admitted to [facility] on 01/29/12 for endoscopy. Patient with remote history of GERD with esophageal stricture who has been unable to keep any food or fluids down prior to admission.   S/p EGD dilation.  Coumadin suspended for procedure only and restarted.  02/03/12 onset with slurred speech as well as expressive aphasia.  CT indicates no acute intracranial abnormality.  Patient referred for BSE per stroke protocol.    Assessment / Plan / Recommendation Clinical Impression  Oropharyngeal swallow appeared functional with no outward s/s of aspiration noted throughout evening meal of regular consistency.  Increased WOB with reduced reserved noted but patient independent in eating at slow rate and taking frequent rest breaks.  Recommend to continue current diet of regular consistency with thin liquids with aspiration and reflux precautions.  ST to follow briefly in acute care setting for diet tolerance due to PMH of esophageal based dysphagia and most recent episode .      Aspiration Risk  Moderate    Diet Recommendation Regular;Thin liquid   Liquid Administration via: Cup Medication Administration: Whole meds with liquid Supervision: Full supervision/cueing for compensatory strategies Compensations: Slow rate;Small sips/bites;Follow solids with liquid;Effortful swallow Postural Changes and/or Swallow Maneuvers: Seated upright 90 degrees;Upright 30-60 min after meal    Other  Recommendations Oral Care Recommendations: Oral care QID Other Recommendations: Clarify dietary restrictions   Follow Up Recommendations  Frequency and Duration min 2x/week  2 weeks       SLP Swallow Goals Patient will consume recommended diet without observed clinical signs of aspiration with: Independent assistance Patient will utilize recommended strategies during swallow to increase swallowing  safety with: Independent assistance   Swallow Study Prior Functional Status   Lives at home with history of esophageal based dysphagia     General Date of Onset: 02/03/12 HPI: 78 y/o female with history of atrial fibrillation, DM, HTN, hyperlipidemia, and non ischemic cardiomyopathy was admitted to [facility] on 01/29/12 for endoscopy. Coumadin suspended for procedure only and restarted.  02/03/12 onset with slurred speech as well as expressive aphasia.  CT indicates no acute intracranial abnormality.  Type of Study: Bedside swallow evaluation Diet Prior to this Study: Regular;Thin liquids Temperature Spikes Noted: No Respiratory Status: Supplemental O2 delivered via (comment) (nasal cannula) Behavior/Cognition: Alert;Cooperative;Pleasant mood Oral Cavity - Dentition: Adequate natural dentition Self-Feeding Abilities: Needs assist Patient Positioning: Upright in bed Baseline Vocal Quality: Clear;Low vocal intensity Volitional Cough: Strong Volitional Swallow: Able to elicit    Oral/Motor/Sensory Function Overall Oral Motor/Sensory Function: Appears within functional limits for tasks assessed Labial ROM: Reduced right Labial Symmetry: Abnormal symmetry right Labial Strength: Within Functional Limits Labial Sensation: Within Functional Limits Lingual ROM: Within Functional Limits Lingual Symmetry: Within Functional Limits Lingual Strength: Within Functional Limits Lingual Sensation: Within Functional Limits Facial ROM: Within Functional Limits Facial Symmetry: Within Functional Limits Facial Strength: Within Functional Limits Facial Sensation: Within Functional Limits Velum: Within Functional Limits Mandible: Within Functional Limits   Ice Chips Ice chips: Within functional limits Presentation: Self Fed   Thin Liquid Thin Liquid: Within functional limits Presentation: Cup;Straw    Nectar Thick Nectar Thick Liquid: Not tested   Honey Thick Honey Thick Liquid: Not tested   Puree Puree:  Within functional limits Presentation: Self Fed;Spoon   Solid   GO     [name] MS, CCC-SLP [phone] Solid: Impaired Oral Phase Functional Implications: Oral residue Pharyngeal Phase Impairments: Other (comments) Other Comments: increase WOB required several rest breaks.        Ilham Roughton 02/04/2012,7:55 PM

## 2012-02-04 NOTE — Consult Note (Signed)
ANTICOAGULATION CONSULT NOTE - Initial Consult  Pharmacy Consult for Coumadin/heparin Indication: atrial fibrillation  Allergies  Allergen Reactions  . Iodinated Diagnostic Agents     Unknown    Patient Measurements: Height: 5\' 2"  (157.5 cm) Weight: 162 lb 7.7 oz (73.7 kg) IBW/kg (Calculated) : 50.1   Vital Signs: Temp: 97.6 F (36.4 C) (11/24 1207) Temp src: Oral (11/24 1207) BP: 98/61 mmHg (11/24 0700) Pulse Rate: 91  (11/24 0700)  Labs:  Basename 02/04/12 1300 02/04/12 0400 02/03/12 0515 02/02/12 0845 02/02/12 0555  HGB -- 12.1 -- -- 11.6*  HCT -- 35.3* -- -- 34.6*  PLT -- 213 -- -- 214  APTT -- -- -- -- --  LABPROT -- 17.7* 16.4* -- --  INR -- 1.50* 1.35 -- --  HEPARINUNFRC 0.30 0.13* -- -- --  CREATININE -- 1.52* 2.10* 1.34* --  CKTOTAL -- -- -- -- --  CKMB -- -- -- -- --  TROPONINI -- -- -- -- --    Estimated Creatinine Clearance: 26.3 ml/min (by C-G formula based on Cr of 1.52).   Medical History: Past Medical History  Diagnosis Date  . Benign neoplasm of colon   . Diverticulosis of colon (without mention of hemorrhage)   . Reflux esophagitis   . Gastric polyp     history of  . GERD (gastroesophageal reflux disease)   . Stricture and stenosis of esophagus   . Reactive airway disease   . Anemia, iron deficiency   . CHF (congestive heart failure)   . Restless leg syndrome   . Hypercholesterolemia   . Nonischemic cardiomyopathy     EF 20%  . Personal history of sudden cardiac death successfully resuscitated     status post cardioverter- defibrillator  . Atrial fibrillation   . Diabetes mellitus   . Osteoarthritis   . Hypertension   . UTI (lower urinary tract infection)   . v45.02    Assessment: 85yof on coumadin pta (2mg  daily except 4mg  on Thursday) for afib. Has a history of GERD with esophageal stricture and admitted with poor PO intake/dysphagia. INR 5.23 on admission and coumadin placed on hold for possible GI procedures.  She is now s/p  endoscopy and resumed coumadin, she had expressive aphasia and right arm weakness last night, started heparin bridge. INR trending up 1.5, but yesterday's coumadin dose wasn't charted (not sure if it was given). Heparin level low-end therapeutic = 0.3. Hgb/Plt are stable, no bleeding noted.   Goal of Therapy:  Heparin level 0.3-0.5 INR 2-3 Monitor platelets by anticoagulation protocol: Yes   Plan:  1) Coumadin 4mg  x 1 2) Continue heparin gtt at current rate 2) Follow up INR, CBC and Heparin level in AM  Bayard Hugger, PharmD, BCPS  Clinical Pharmacist  Pager: [phone]

## 2012-02-04 NOTE — Progress Notes (Signed)
ANTICOAGULATION CONSULT NOTE - Follow Up Consult  Pharmacy Consult for heparin Indication: atrial fibrillation  Labs:  Basename 02/04/12 0400 02/03/12 0515 02/02/12 0845 02/02/12 0555 02/01/12 1808  HGB 12.1 -- -- 11.6* --  HCT 35.3* -- -- 34.6* --  PLT 213 -- -- 214 --  APTT -- -- -- -- --  LABPROT 17.7* 16.4* -- -- --  INR 1.50* 1.35 -- -- --  HEPARINUNFRC 0.13* -- -- -- --  CREATININE -- 2.10* 1.34* -- 1.40*  CKTOTAL -- -- -- -- --  CKMB -- -- -- -- --  TROPONINI -- -- -- -- --    Assessment: 78yo female subtherapeutic on heparin with initial dosing for Afib.  Goal of Therapy:  Heparin level 0.3-0.7 units/ml   Plan:  Will increase heparin gtt by 3 units/kg/hr to 1150 units/hr and check level in 8hr.  Colleen Can PharmD BCPS 02/04/2012,5:02 AM

## 2012-02-04 NOTE — Progress Notes (Signed)
Stroke Team Rounding 78 year old female with a history of atrial fibrillation, diabetes mellitus, hypertension, hyperlipidemia and nonischemic cardiomyopathy (EF 20-25%), who was admitted on 01/29/2012 for workup of upper GI dysfunction including endoscopy. Patient has been on Coumadin for anticoagulation. Coumadin was suspended for endoscopy been restarted following the procedure. Neurology consultation was obtained because of new onset of slurred speech as well as expressive aphasia with waxing and waning of severity. Patient was last seen normal at 3:30 PM on 02/03/12. CT scan of her head showed no acute intracranial abnormality. Patient had no clinical history of previous stroke nor TIA.  Subjective: No new events. Now on heparin and coumadin. Doing better with language. Right arm weakness is persistent.  Objective: Vital signs in last 24 hours: Filed Vitals:   02/04/12 0600 02/04/12 0700 02/04/12 0800 02/04/12 1207  BP: 96/51 98/61    Pulse: 71 91    Temp:   98.1 F (36.7 C) 97.6 F (36.4 C)  TempSrc:    Oral  Resp: 19 20    Height:      Weight:      SpO2: 98% 98%       Intake/Output from previous day: 11/23 0701 - 11/24 0700 In: 1109.4 [P.O.:240; I.V.:419.4; IV Piggyback:450] Out: 100 [Urine:100]  GENERAL EXAM: Patient is in no distress  CARDIOVASCULAR: Regular rate and rhythm, no murmurs, no carotid bruits  NEUROLOGIC: MENTAL STATUS: awake, alert, language fluent, comprehension intact, naming intact; ONLY ERROR WAS READING BASEBALL PLAYER AS "BASKETBALL PLAYER". CRANIAL NERVE: pupils equal and reactive to light, visual fields full to confrontation, extraocular muscles intact, no nystagmus, facial sensation and strength symmetric, uvula midline, shoulder shrug symmetric, tongue midline. MOTOR: normal bulk and tone, RIGHT ARM DRIFT, RIGHT TRICEPS AND GRIP WEAKNESS. LEFT ARM AND BLE FULL STRENGTH. SENSORY: normal and symmetric to light touch COORDINATION: finger-nose-finger  LIMITED ON RUE DUE TO WEAKNESS.  Lab Results:  Basename 02/04/12 0400 02/02/12 0555  WBC 14.5* 13.0*  HGB 12.1 11.6*  HCT 35.3* 34.6*  PLT 213 214    BMET  Basename 02/04/12 0400 02/03/12 0515  NA 134* 137  K 2.9* 3.2*  CL 98 98  CO2 25 26  GLUCOSE 105* 120*  BUN 41* 45*  CREATININE 1.52* 2.10*  CALCIUM 8.8 8.7    LIPIDS: No results found for this basename: chol, trig, hdl, cholhdl, vldl, ldlcalc    HEMOGLOBIN A1C: No results found for this basename: HGBA1C     Studies/Results: Ct Head Wo Contrast  02/03/2012  *RADIOLOGY REPORT*  Clinical Data: 78 year old female with atrial fibrillation on Coumadin, subtherapeutic, slurred speech.  CT HEAD WITHOUT CONTRAST  Technique:  Contiguous axial images were obtained from the base of the skull through the vertex without contrast.  Comparison: Head CTs 10/14/2011 and earlier.  Findings: Visualized paranasal sinuses and mastoids are clear.  No acute orbit or scalp soft tissue findings. No acute osseous abnormality identified.  Calcified atherosclerosis at the skull base.  Stable cerebral volume.  No ventriculomegaly.  Cortical encephalomalacia in the anterior right frontal lobe on image 19 seems increased, but appears chronic. Patchy and confluent periventricular and subcortical white matter hypodensity in the cerebral hemispheres has not significantly changed.  No acute cortically based infarct identified. No midline shift, mass effect, or evidence of mass lesion.  No acute intracranial hemorrhage identified.  No suspicious intracranial vascular hyperdensity.  IMPRESSION: No acute cortically based infarct or intracranial hemorrhage identified.  Interval progression of focal right frontal lobe encephalomalacia, but appears chronic.  Chronically advanced white matter disease.   Original Report Authenticated By: Erskine Speed, M.D.    Dg Chest Port 1 View  02/03/2012  *RADIOLOGY REPORT*  Clinical Data: CHF and status post PICC line placement.   PORTABLE CHEST - 1 VIEW  Comparison: 1235 hours  Findings: Portable film at 1603 hours demonstrates interval placement of a right upper extremity PICC line.  The tip is located in the distal SVC.  Lungs show relatively stable CHF.  IMPRESSION: PICC line tip is in the lower SVC.   Original Report Authenticated By: Irish Lack, M.D.    Dg Chest Port 1 View  02/03/2012  *RADIOLOGY REPORT*  Clinical Data: Dyspnea.  PORTABLE CHEST - 1 VIEW  Comparison: 01/31/2012  Findings: Pacer / AICD device.  Mild cardiomegaly.  Probable small bilateral pleural effusions. No pneumothorax.  Patchy interstitial and airspace disease is improved on the right.  Mild left base volume loss. Glenohumeral joint osteoarthritis bilaterally.  IMPRESSION: Improved right-sided aeration.  Either infection or asymmetric pulmonary edema.  Cardiomegaly with possible small bilateral pleural effusions.   Original Report Authenticated By: Jeronimo Greaves, M.D.    MRI brain - cannot due to AICD  MRA head - cannot due to AICD  Carotid u/s  Medications:     . amiodarone  150 mg Intravenous Once  . cefTRIAXone (ROCEPHIN)  IV  1 g Intravenous Q24H  . digoxin  0.0625 mg Oral Daily  . levothyroxine  100 mcg Oral Daily  . [COMPLETED] potassium chloride  10 mEq Intravenous Q1 Hr x 2  . potassium chloride  10 mEq Intravenous Q1 Hr x 4  . [COMPLETED] potassium chloride      . [COMPLETED] potassium chloride      . [COMPLETED] potassium chloride      . sodium chloride  250 mL Intravenous Once  . [COMPLETED] sodium chloride  250 mL Intravenous Once  . sodium chloride  10-40 mL Intracatheter Q12H  . warfarin  5 mg Oral ONCE-1800  . Warfarin - Physician Dosing Inpatient   Does not apply q1800  . [DISCONTINUED] potassium chloride  10 mEq Intravenous Q1 Hr x 4      . sodium chloride 10 mL (02/03/12 2005)  . [EXPIRED] amiodarone (NEXTERONE PREMIX) 360 mg/200 mL dextrose 1 mg/min (02/03/12 1319)   Followed by  . amiodarone (NEXTERONE  PREMIX) 360 mg/200 mL dextrose 0.5 mg/min (02/04/12 0404)  . heparin 1,150 Units/hr (02/04/12 0500)  . [DISCONTINUED] heparin       Assessment: 78 year old female with a history of atrial fibrillation, diabetes mellitus, hypertension, hyperlipidemia and nonischemic cardiomyopathy (EF 20-25%), who was admitted on 01/29/2012 for workup of upper GI dysfunction including endoscopy. Had expressive aphasia and right arm weakness on 02/03/12, while off coumadin (INR 1.3).  Stroke Risk Factors - atrial fibrillation, diabetes mellitus, hypertension, hyperlipidemia   LOS: 6 days   Plan: - f/u carotid u/s - check lipid panel, A1c - Risk factor modification (goal SBP < 180, goal LDL < 100, goal A1c < 6) - Antiplatelet/Anticoagulation therapy: heparin gtt --> coumadin for atrial fibrillation - Maintain euvolemia, euglycemia, euthermia - PT consult, OT consult, Speech consult  [facility] - Stroke Team Joycelyn Schmid, MD 02/04/2012, 12:30 PM   Please refer to [email or web address] for on-call Stroke MD

## 2012-02-04 NOTE — Progress Notes (Signed)
Patient ID: [name], female   DOB: 25-Apr-1926, 78 y.o.   MRN: [id number]       Subjective   [name] is a 78yo female with PMHx s/f idiopathic dilated cardiomyopathy (11/12 echo: LVEF 20-25%, diffuse HK, moderate MR, severe LAE, PFO, mod TR, PASP ), PAF (on chronic Coumadin), symptomatic bradycardia (s/p CRT-D->CRT-P revision 03/12), CKD (stage III) and history of esophageal stricture who was admitted on 11/18 w/ acute esophageal obstruction  Underwent esoph dilation 11/21. Yesterday developed respiratory distress requiring bipap. With hypotension with BPs in 80s. ICD interrogated and noted to have episodes of AF underlying CRT (apparently only 2% of total rhythm). I do not see CXR on chart since 11/20.   SBP remains soft in 90-100s. Cr up 1.3 -> 2.1 -> 1.5.   Telemetry reviewed with Dr. Graciela Husbands. Looks like mostly AF/AFL with rates between 80-110. + episodes NSVT.   Yesterday she was noted to be aphasic with likely CVA.  When this was noted, she was outside window for tPA.  This morning she is still somewhat aphasic but is able to communicate reasonably well.  Heparin gtt was started.  INR still subtherapeutic.  No dyspnea or chest pain.  CVP 7-8 cm this morning.     Objective  Physical Exam: Vitals: BP 98/61  Pulse 91  Temp 98 F (36.7 C) (Oral)  Resp 20  Ht 5\' 2"  (1.575 m)  Wt 162 lb 7.7 oz (73.7 kg)  BMI 29.72 kg/m2  SpO2 98%  LMP 01/10/2012 General: Well developed, elderly 78 year old female in no acute distress. Neck: Supple. JVD not elevated. Lungs: Clear bilaterally to auscultation without wheezes, rales, or rhonchi. Breathing is unlabored. Heart: Irreg S1 S2 without murmurs, rubs, or gallops.  Abdomen: Soft, non-distended. Extremities: No clubbing or cyanosis. Tr edema.  Distal pedal pulses are 2+ and equal bilaterally. Neuro: Alert and oriented X 3. Moves all extremities spontaneously. No focal deficits.  Intake/Output:  Intake/Output Summary (Last 24 hours)  at 02/04/12 0743 Last data filed at 02/04/12 0700  Gross per 24 hour  Intake 1109.44 ml  Output    100 ml  Net 1009.44 ml    Inpatient Medications:     . amiodarone  150 mg Intravenous Once  . cefTRIAXone (ROCEPHIN)  IV  1 g Intravenous Q24H  . digoxin  0.0625 mg Oral Daily  . levothyroxine  100 mcg Oral Daily  . potassium chloride  10 mEq Intravenous Q1 Hr x 2  . [COMPLETED] potassium chloride      . [COMPLETED] potassium chloride      . sodium chloride  250 mL Intravenous Once  . [COMPLETED] sodium chloride  250 mL Intravenous Once  . sodium chloride  10-40 mL Intracatheter Q12H  . warfarin  5 mg Oral ONCE-1800  . Warfarin - Physician Dosing Inpatient   Does not apply q1800  . [DISCONTINUED] amiodarone  100 mg Oral Daily  . [DISCONTINUED] irbesartan  37.5 mg Oral QHS  . [DISCONTINUED] metoprolol tartrate  25 mg Oral Q8H  . [DISCONTINUED] spironolactone  12.5 mg Oral Daily      . sodium chloride 10 mL (02/03/12 2005)  . [EXPIRED] amiodarone (NEXTERONE PREMIX) 360 mg/200 mL dextrose 1 mg/min (02/03/12 1319)   Followed by  . amiodarone (NEXTERONE PREMIX) 360 mg/200 mL dextrose 0.5 mg/min (02/04/12 0404)  . heparin 1,150 Units/hr (02/04/12 0500)  . [DISCONTINUED] 0.9 % NaCl with KCl 20 mEq / L 30 mL/hr at 01/31/12 0717  . [DISCONTINUED]  heparin      Labs:  [facility] 02/04/12 0400 02/03/12 0515 02/02/12 0555  NA 134* 137 --  K 2.9* 3.2* --  CL 98 98 --  CO2 25 26 --  GLUCOSE 105* 120* --  BUN 41* 45* --  CREATININE 1.52* 2.10* --  CALCIUM 8.8 8.7 --  MG -- -- 1.7  PHOS -- -- --    Basename 02/04/12 0400 02/02/12 0555  WBC 14.5* 13.0*  NEUTROABS -- --  HGB 12.1 11.6*  HCT 35.3* 34.6*  MCV 98.9 100.3*  PLT 213 214   No results found for this basename: CKTOTAL:4,CKMB:4,TROPONINI:4 in the last 72 hours  Basename 02/04/12 0400  INR 1.50*    Radiology/Studies: CXR (11/23): Patchy right-sided airspace disease, improved.  Infection versus asymmetric edema.      Echocardiogram: Study Conclusions - Left ventricle: LVEF is severely depressed at approximately 15 to 25% with akinesisinferoseptum, posterior wall, inferior wall and apex; severe hypokinesis elsewhere. The cavity size was severely dilated. Wall thickness was normal. - Aortic valve: Mild regurgitation. - Mitral valve: Moderate regurgitation. - Left atrium: Suspicious for small PFO by color doppler The atrium was moderately to severely dilated. - Pulmonary arteries: PA peak pressure: 40mm Hg (S).  Telemetry: atrial fibrillation with BiV pacing    Assessment  1. NICM, EF 20%, s/p CRT-P   2. Acute on chronic systolic HF  3. PAFib/FL with RVR 4. NSVT - asymptomatic;  5. A/c renal failure 6. Hypotension 7. Acute respiratory failure - improved 8. Esophageal stricture s/p dilation 9. CVA with aphasia  Assessment and nd  Plan/Discussion  Pla  SBP stable in 90s-100s and HR in the 80s-90s.  INR 1.5.  Will continue digoxin and amiodarone IV for now. Given stroke yesterday, would be leery about TEE-guided DCCV as had been planned for Monday.  Would hold off for now and wait at least a few days and therapeutic INR.  Agree that hemodynamics may improve with NSR.  Continue heparin/coumadin.   She will need speech/swallow evaluation this morning to determine appropriate diet.  She will need PT.  Sheikh Leverich,MD 7:43 AM

## 2012-02-04 NOTE — Progress Notes (Signed)
eLink Physician-Brief Progress Note Patient Name: [name] DOB: September 22, 1926 MRN: [id number]  Date of Service  02/04/2012   HPI/Events of Note  Hyperkalemia    eICU Interventions  K replaced      [name] 02/04/2012, 5:44 AM

## 2012-02-05 ENCOUNTER — Inpatient Hospital Stay (HOSPITAL_COMMUNITY): Payer: Medicare Other

## 2012-02-05 LAB — CBC
HCT: 33.3 % — ABNORMAL LOW (ref 36.0–46.0)
Hemoglobin: 11.4 g/dL — ABNORMAL LOW (ref 12.0–15.0)
MCH: 33.7 pg (ref 26.0–34.0)
MCHC: 34.2 g/dL (ref 30.0–36.0)
MCV: 98.5 fL (ref 78.0–100.0)
RDW: 15 % (ref 11.5–15.5)

## 2012-02-05 LAB — CARBOXYHEMOGLOBIN
O2 Saturation: 69.3 %
Total hemoglobin: 11.4 g/dL — ABNORMAL LOW (ref 12.0–16.0)

## 2012-02-05 LAB — BASIC METABOLIC PANEL
Chloride: 99 mEq/L (ref 96–112)
GFR calc Af Amer: 44 mL/min — ABNORMAL LOW (ref >90)
GFR calc non Af Amer: 38 mL/min — ABNORMAL LOW (ref >90)
Potassium: 3.5 mEq/L (ref 3.5–5.1)
Sodium: 132 mEq/L — ABNORMAL LOW (ref 135–145)

## 2012-02-05 LAB — PROTIME-INR
INR: 1.65 — ABNORMAL HIGH (ref 0.00–1.49)
Prothrombin Time: 19 seconds — ABNORMAL HIGH (ref 11.6–15.2)

## 2012-02-05 LAB — GLUCOSE, CAPILLARY: Glucose-Capillary: 125 mg/dL — ABNORMAL HIGH (ref 70–99)

## 2012-02-05 MED ORDER — WARFARIN SODIUM 4 MG PO TABS
4.0000 mg | ORAL_TABLET | Freq: Once | ORAL | Status: AC
  Administered 2012-02-05: 4 mg via ORAL
  Filled 2012-02-05: qty 1

## 2012-02-05 MED ORDER — ENSURE COMPLETE PO LIQD
237.0000 mL | Freq: Two times a day (BID) | ORAL | Status: DC
  Administered 2012-02-05 – 2012-02-11 (×7): 237 mL via ORAL

## 2012-02-05 NOTE — Progress Notes (Signed)
Patient Name: [name]      SUBJECTIVE:  Aphasia resolved  Still with sob  PT oT pending  Past Medical History  Diagnosis Date  . Benign neoplasm of colon   . Diverticulosis of colon (without mention of hemorrhage)   . Reflux esophagitis   . Gastric polyp     history of  . GERD (gastroesophageal reflux disease)   . Stricture and stenosis of esophagus   . Reactive airway disease   . Anemia, iron deficiency   . CHF (congestive heart failure)   . Restless leg syndrome   . Nonischemic cardiomyopathy     EF 20-25% 01-23-12 / BiV AICD  . Personal history of sudden cardiac death successfully resuscitated     status post cardioverter- defibrillator  . Atrial fibrillation   . Diabetes mellitus   . Osteoarthritis   . Hypertension   . UTI (lower urinary tract infection)   . v45.02   . Chronic kidney disease (CKD), stage III (moderate)   . Hyperlipemia   . GERD with stricture   . Anticoagulated on Coumadin     PHYSICAL EXAM Filed Vitals:   02/05/12 0500 02/05/12 0600 02/05/12 0700 02/05/12 0820  BP: 116/67 113/63 120/59   Pulse: 77 75 82   Temp:    97.7 F (36.5 C)  TempSrc:    Oral  Resp: 20 18 22    Height:      Weight:      SpO2: 97% 96% 96%     Well developed and nourished in no acute distress HENT normal Neck supple  t Clear Irregularly irregular rate and rhythm with controlled  ventricular response, no murmurs or gallops Abd-soft with active BS without hepatomegaly No Clubbing cyanosis edema Skin-warm and dry A & Oriented  Grossly normal sensory and motor function  TELEMETRY: Reviewed telemetry pt in :afb    Intake/Output Summary (Last 24 hours) at 02/05/12 0823 Last data filed at 02/05/12 0700  Gross per 24 hour  Intake  825.8 ml  Output    100 ml  Net  725.8 ml    LABS: Basic Metabolic Panel:  Lab 02/05/12 1610 02/04/12 1800 02/04/12 0400 02/03/12 0515 02/02/12 0845 02/02/12 0555 02/01/12 1808 02/01/12 0159  NA 132* 133* 134* 137 139 --  137 147*  K 3.5 4.3 2.9* 3.2* 3.2* -- 4.0 3.2*  CL 99 98 98 98 101 -- 100 109  CO2 24 24 25 26 26  -- 23 23  GLUCOSE 112* 118* 105* 120* 110* -- 135* 90  BUN 36* 38* 41* 45* 33* -- 35* 32*  CREATININE 1.28* 1.38* 1.52* 2.10* 1.34* -- 1.40* 1.39*  CALCIUM 8.8 8.9 -- -- -- -- -- --  MG -- -- -- -- -- 1.7 -- --  PHOS -- -- -- -- -- -- -- --   Cardiac Enzymes: No results found for this basename: CKTOTAL:3,CKMB:3,CKMBINDEX:3,TROPONINI:3 in the last 72 hours CBC:  Lab 02/05/12 0430 02/04/12 0400 02/02/12 0555 02/01/12 0159 01/31/12 0605 01/30/12 0550 01/29/12 1849  WBC 11.4* 14.5* 13.0* 14.7* 13.1* 10.1 11.0*  NEUTROABS -- -- -- -- -- -- --  HGB 11.4* 12.1 11.6* 11.2* 12.0 11.1* 12.0  HCT 33.3* 35.3* 34.6* 34.0* 36.0 33.9* 36.2  MCV 98.5 98.9 100.3* 99.1 99.4 98.8 98.9  PLT 218 213 214 215 207 190 210   PROTIME:  Basename 02/05/12 0430 02/04/12 0400 02/03/12 0515  LABPROT 19.0* 17.7* 16.4*  INR 1.65* 1.50* 1.35   Liver Function Tests: No results found  for this basename: AST:2,ALT:2,ALKPHOS:2,BILITOT:2,PROT:2,ALBUMIN:2 in the last 72 hours No results found for this basename: LIPASE:2,AMYLASE:2 in the last 72 hours BNP: BNP (last 3 results)  Basename 01/31/12 1555 01/29/12 1849  PROBNP 25282.0* 15984.0*     ASSESSMENT AND PLAN:  Patient Active Hospital Problem List: CHF (congestive heart failure), NYHA class IV (01/31/2012)  HYPERTENSION (04/30/2007)  CARDIOMYOPATHY, PRIMARY (05/26/2008)  Atrial fibrillation (05/26/2008) * Hypotension (02/03/2012)  Acute on chronic renal failure (02/03/2012) MUCH IMPROVED  Transient ischemic attack (02/04/2012)  Still on  Amio for rate control;  With improving renal function, will discuss with DB re adding ACE for afterload; his thought a good one is to hold off so as to allow maintence of CPP.  CVP is low, and maybe needs more volume with target CVP of 7-9 Depending on HR will consider metop in am ? Role of heparin in context of  stroke, defer to Neuro   Check dig level mobilize   Signed, Sherryl Manges MD  02/05/2012

## 2012-02-05 NOTE — Progress Notes (Signed)
Pt has not worn bipap since November 20th. Pt was removed from oxygen today.  Spoke with pt and rn and they feel she doesn't need bipap at this time, but they are both aware the order is there if needed.

## 2012-02-05 NOTE — Progress Notes (Signed)
SLP has reviewed and agrees with student's note below.  Breck Coons Newell.Ed [employer] [phone]  02/05/2012

## 2012-02-05 NOTE — Progress Notes (Signed)
Physical Therapy Re-evaluation Patient Details Name: [name] MRN: [id number] DOB: 21-Jun-1926 Today's Date: 02/05/2012 Time: 0928-1010 PT Time Calculation (min): 42 min  PT Assessment / Plan / Recommendation Comments on Treatment Session  78 year old female with a history of atrial fibrillation, diabetes mellitus, hypertension, hyperlipidemia and nonischemic cardiomyopathy (EF 20-25%), who was admitted on 01/29/2012 for workup of upper GI dysfunction including endoscopy. Underwent esoph dilation 11/21. Had expressive aphasia and right arm weakness on 02/03/12. Neurology following now and suspect small CVA.   Initial PT eval 11/19. Presents to PT today for re-evaluation. Speech has returned to normal per patient however has persistent RUE weakness grossly 2-2+/5. Pt also with increased weakness LLE. Mobility now further limited by new weakness as well as fatigue, decreased activity tolerance, generalized weakness, and pain. Will continue to benefit physical therapy while in the acute setting to maxmize strength and function so as to facilitate safe d/c. Agree SNF prior to d/c home is best plan at this time.      Follow Up Recommendations  SNF     Does the patient have the potential to tolerate intense rehabilitation     Barriers to Discharge        Equipment Recommendations  None recommended by PT    Recommendations for Other Services OT consult  Frequency Min 3X/week   Plan Discharge plan needs to be updated;Frequency remains appropriate    Precautions / Restrictions Precautions Precautions: Fall Restrictions Weight Bearing Restrictions: No   Pertinent Vitals/Pain Vitals stable however pt with increased c/o dizziness and fatigue limiting our session today.     Mobility  Bed Mobility Bed Mobility: Rolling Right;Rolling Left;Sit to Supine Rolling Right: 4: Min assist;With rail Rolling Left: 4: Min assist;With rail Supine to Sit: 3: Mod assist;HOB elevated (30  degrees) Sitting - Scoot to Edge of Bed: 3: Mod assist Sit to Supine: 2: Max assist Details for Bed Mobility Assistance: cues for sequencing, pt using LUE to assist RUE in reaching to use bed rails, noted increased fatigue today making bed mobility much harder for her; facilitation for all movements to ease the difficulty of transitions Transfers Transfers: Sit to Stand;Stand to Sit Sit to Stand: 3: Mod assist;With upper extremity assist Stand to Sit: 3: Mod assist;With upper extremity assist Details for Transfer Assistance: mod facilitation for anterior translation of trunk over BOS as well as assist to power up through extremities, pt very weak with c/o dizziness; BP checked and remained at or around 112 sbp; pt needing assist to control descent back to bed Ambulation/Gait Ambulation/Gait Assistance: Not tested (comment) Ambulation/Gait Assistance Details: pt too fatigued today for anymore activity     Exercises General Exercises - Lower Extremity Ankle Circles/Pumps: AROM;Both;10 reps;Supine    PT Goals Acute Rehab PT Goals Pt will go Supine/Side to Sit: with supervision PT Goal: Supine/Side to Sit - Progress: Goal set today Pt will go Sit to Supine/Side: with supervision PT Goal: Sit to Supine/Side - Progress: Goal set today Pt will go Sit to Stand: with supervision PT Goal: Sit to Stand - Progress: Progressing toward goal Pt will go Stand to Sit: with supervision PT Goal: Stand to Sit - Progress: Goal set today Pt will Transfer Bed to Chair/Chair to Bed: with supervision PT Transfer Goal: Bed to Chair/Chair to Bed - Progress: Goal set today Pt will Stand: with supervision;3 - 5 min PT Goal: Stand - Progress: Goal set today Pt will Ambulate: 16 - 50 feet;with least restrictive assistive device;with min  assist PT Goal: Ambulate - Progress: Revised due to lack of progress PT Goal: Up/Down Stairs - Progress: Discontinued (comment) (not needed at this time) Pt will Perform Home  Exercise Program: with supervision, verbal cues required/provided PT Goal: Perform Home Exercise Program - Progress: Goal set today  Visit Information  Last PT Received On: 02/05/12 Assistance Needed: +2    Subjective Data  Subjective: I am just so tired.    Cognition  Overall Cognitive Status: Appears within functional limits for tasks assessed/performed Arousal/Alertness: Awake/alert Orientation Level: Appears intact for tasks assessed Behavior During Session: [facility] for tasks performed    Balance  Balance Balance Assessed: Yes Static Sitting Balance Static Sitting - Balance Support: Bilateral upper extremity supported Static Sitting - Level of Assistance: 5: Stand by assistance Static Sitting - Comment/# of Minutes: flexed/kyphotic trunk, just sitting on the edge of the bed for 1-2 minutes fatigued her today Static Standing Balance Static Standing - Balance Support: Bilateral upper extremity supported Static Standing - Level of Assistance: 4: Min assist;3: Mod assist Static Standing - Comment/# of Minutes: stood about a minute but pt with increased dizziness, min facilitation for upright trunk and tall posture  End of Session PT - End of Session Equipment Utilized During Treatment: Gait belt Activity Tolerance: Patient limited by fatigue Patient left: in bed;with call bell/phone within reach;with family/visitor present   GP     [facility] HELEN 02/05/2012, 1:46 PM

## 2012-02-05 NOTE — Progress Notes (Signed)
Stroke Team Rounding 78 year old female with a history of atrial fibrillation, diabetes mellitus, hypertension, hyperlipidemia and nonischemic cardiomyopathy (EF 20-25%), who was admitted on 01/29/2012 for workup of upper GI dysfunction including endoscopy. Patient has been on Coumadin for anticoagulation. Coumadin was suspended for endoscopy been restarted following the procedure. Neurology consultation was obtained because of new onset of slurred speech as well as expressive aphasia with waxing and waning of severity. Patient was last seen normal at 3:30 PM on 02/03/12. CT scan of her head showed no acute intracranial abnormality. Patient had no clinical history of previous stroke nor TIA.  Subjective: Her daughter is at the bedside.She still has some struggle with speech but weakness has improved.  Objective: Vital signs in last 24 hours: Filed Vitals:   02/05/12 0500 02/05/12 0600 02/05/12 0700 02/05/12 0820  BP: 116/67 113/63 120/59   Pulse: 77 75 82   Temp:    97.7 F (36.5 C)  TempSrc:    Oral  Resp: 20 18 22    Height:      Weight:      SpO2: 97% 96% 96%      Intake/Output from previous day: 11/24 0701 - 11/25 0700 In: 864 [P.O.:100; I.V.:764] Out: 100 [Urine:100]  GENERAL EXAM: Patient is in no distress CARDIOVASCULAR: Regular rate and rhythm, no murmurs, no carotid bruits NEUROLOGIC: MENTAL STATUS: awake, alert, language fluent, comprehension intact, naming intact; ONLY ERROR WAS READING BASEBALL PLAYER AS "BASKETBALL PLAYER". CRANIAL NERVE: pupils equal and reactive to light, visual fields full to confrontation, extraocular muscles intact, no nystagmus, facial sensation and strength symmetric, uvula midline, shoulder shrug symmetric, tongue midline. MOTOR: normal bulk and tone, RIGHT ARM DRIFT, RIGHT TRICEPS AND GRIP WEAKNESS. LEFT ARM AND BLE FULL STRENGTH. SENSORY: normal and symmetric to light touch COORDINATION: finger-nose-finger LIMITED ON RUE DUE TO  WEAKNESS.  Lab Results:  Basename 02/05/12 0430 02/04/12 0400  WBC 11.4* 14.5*  HGB 11.4* 12.1  HCT 33.3* 35.3*  PLT 218 213    BMET  Basename 02/05/12 0430 02/04/12 1800  NA 132* 133*  K 3.5 4.3  CL 99 98  CO2 24 24  GLUCOSE 112* 118*  BUN 36* 38*  CREATININE 1.28* 1.38*  CALCIUM 8.8 8.9    LIPIDS: No results found for this basename: chol,  trig,  hdl,  cholhdl,  vldl,  ldlcalc    HEMOGLOBIN A1C: No results found for this basename: HGBA1C     Studies/Results: Ct Head   02/05/3012 02/03/2012 No acute cortically based infarct or intracranial hemorrhage identified.  Interval progression of focal right frontal lobe encephalomalacia, but appears chronic.  Chronically advanced white matter disease.     Chest Xray  02/03/2012  * PICC line tip is in the lower SVC.    02/03/2012 Improved right-sided aeration.  Either infection or asymmetric pulmonary edema.  Cardiomegaly with possible small bilateral pleural effusions.     MRI brain - cannot due to AICD  MRA head - cannot due to AICD  Carotid Doppler  TCD  2D echo  02/01/2012 EF 12-25% with akinesisinferoseptum, posterior wall, inferior wall and apex; severe hypokinesis elsewhere. The cavity size was severely dilated. Wall thickness was normal.  Medications:    . amiodarone  150 mg Intravenous Once  . cefTRIAXone (ROCEPHIN)  IV  1 g Intravenous Q24H  . digoxin  0.0625 mg Oral Daily  . levothyroxine  100 mcg Oral Daily  . [COMPLETED] potassium chloride  10 mEq Intravenous Q1 Hr x 4  . [  COMPLETED] potassium chloride      . sodium chloride  250 mL Intravenous Once  . sodium chloride  10-40 mL Intracatheter Q12H  . [COMPLETED] warfarin  4 mg Oral ONCE-1800  . warfarin  4 mg Oral ONCE-1800  . Warfarin - Physician Dosing Inpatient   Does not apply q1800  . [DISCONTINUED] potassium chloride  10 mEq Intravenous Q1 Hr x 4  . [DISCONTINUED] warfarin  5 mg Oral ONCE-1800      . sodium chloride 10 mL (02/04/12 1900)   . amiodarone (NEXTERONE PREMIX) 360 mg/200 mL dextrose 0.5 mg/min (02/05/12 0308)  . heparin 1,150 Units/hr (02/04/12 1900)   Physical Exam : pleasant elderly caucasian lady not in distress.Awake alert. Afebrile. Head is nontraumatic. Neck is supple without bruit. Hearing is normal. Cardiac exam no murmur or gallop. Lungs are clear to auscultation. Distal pulses are well felt.  Neurological Exam ; awake alert oriented to time place and person. Mild nonfluent speech with occasional word hesitancy but no paraphasic errors. Able to name and repeat quite well. Good comprehension. Extraocular moments are full range without nystagmus. Fundi were not visualized. Vision acuity and fields appear normal. Face is symmetric without weakness. Tongue is midline. Motor system exam reveals no upper or lower extremity drift. Fine finger movements are slightly diminished on the right. Orbits left over right approximately. Symmetric strength in both lower extremity is. Deep tendon flexes are 1+ symmetric. Plantars are downgoing. Sensation appears intact. Gait was not tested. Assessment: 78 year old female with a history of atrial fibrillation, diabetes mellitus, hypertension, hyperlipidemia and nonischemic cardiomyopathy (EF 20-25%), who was admitted on 01/29/2012 for workup of upper GI dysfunction including endoscopy. Had expressive aphasia and right arm weakness on 02/03/12, while off coumadin (INR 1.3).suspect small left brain embolic infarct not seen on initial CT head. Cannot do MRI due to pacer. Stroke Risk Factors - atrial fibrillation, diabetes mellitus, hypertension, hyperlipidemia   LOS: 7 days   Plan: - f/u carotid u/s and repeat CT head wo - check lipid panel, A1c - Risk factor modification (goal SBP < 180, goal LDL < 100, goal A1c < 6) - continue heparin gtt with transition to coumadin for atrial fibrillation - Maintain euvolemia, euglycemia, euthermia - PT consult, OT consult, Speech consult - repeat  CT head to confirm stroke - Check TCD  Annie Main, MSN, RN, ANVP-BC, ANP-BC, GNP-BC [facility] Pager: [phone] 02/05/2012 8:40 AM  I have personally examined this patient, reviewed pertinent data and developed plan of care and discussed her care with patient and her daughter and degree with above  Delia Heady, MD Medical Director [facility] Pager: [phone] 02/05/2012 3:36 PM

## 2012-02-05 NOTE — Clinical Social Work Note (Signed)
Clinical Social Worker spoke with patient and her daughter, Arna Medici at bedside regarding discharge planning. They were previously looking into [facility] for independent living, but are agreeable to SNF placement at discharge. Per daughter, the plan is for short term SNF placement.  Patient reported that she prefers [name] if it is available. CSW encouraged the family to consider a backup SNF, if their chosen facility does not have availability. CSW will continue to follow.   Rozetta Nunnery MSW, [employer] [phone]

## 2012-02-05 NOTE — Progress Notes (Signed)
Speech Language Pathology Dysphagia Treatment Patient Details Name: [name] MRN: [id number] DOB: 25-Feb-1927 Today's Date: 02/05/2012 Time: 8295-6213 SLP Time Calculation (min): 13 min  Assessment / Plan / Recommendation Clinical Impression  Pt. seen for dysphagia treatment focusing on utilization of compensatory strategies. She is pleasant and cooperative requiring minimal verbal cueing to follow solids with liquids. Daughter and "son-in-law" present at bedside as well. No outward s/s of aspiration observed. Pt. and family educated on compensatory strategies and reflux precautions (i.e. sitting upright for meals and 30-60 minutes after, alternating liquids and solids).  Pt. reports a decrease in esophageal dysphagia since dilation 11/20.  Pt. and family agreable to recommendations. At this time, no ST is warranted, please reorder if necessary.     Diet Recommendation  Continue with Current Diet: Regular;Thin liquid    SLP Plan  (close to baseline, no further ST warranted)    General Temperature Spikes Noted: No Respiratory Status: Supplemental O2 delivered via (comment) Behavior/Cognition: Alert;Cooperative;Pleasant mood Oral Cavity - Dentition: Adequate natural dentition Patient Positioning: Upright in bed  Oral Cavity - Oral Hygiene Brush patient's teeth BID with toothbrush (using toothpaste with fluoride): Yes Patient is HIGH RISK - Oral Care Protocol followed (see row info): Yes   Dysphagia Treatment Treatment focused on: Skilled observation of diet tolerance;Patient/family/caregiver education Family/Caregiver Educated:  (daughter and "son-in-law") Treatment Methods/Modalities: Skilled observation Patient observed directly with PO's: Yes Type of PO's observed: Regular;Thin liquids Feeding: Able to feed self Liquids provided via: Cup Type of cueing: Verbal Amount of cueing: Minimal   GO     [name], [name] 02/05/2012, 10:00 AM

## 2012-02-05 NOTE — Progress Notes (Signed)
  Vascular Ultrasound Transcranial Doppler has been completed.  Study was technically difficult due to poor acoustic windows.  02/05/2012 1:39 PM Gertie Fey, RDMS, RDCS

## 2012-02-05 NOTE — Consult Note (Signed)
ANTICOAGULATION CONSULT NOTE - Initial Consult  Pharmacy Consult for Coumadin/heparin Indication: atrial fibrillation  Allergies  Allergen Reactions  . Iodinated Diagnostic Agents     Unknown    Patient Measurements: Height: 5\' 2"  (157.5 cm) Weight: 162 lb 0.6 oz (73.5 kg) IBW/kg (Calculated) : 50.1   Vital Signs: Temp: 97.4 F (36.3 C) (11/25 0303) Temp src: Oral (11/25 0303) BP: 120/59 mmHg (11/25 0700) Pulse Rate: 82  (11/25 0700)  Labs:  Basename 02/05/12 0430 02/04/12 1800 02/04/12 1300 02/04/12 0400 02/03/12 0515  HGB 11.4* -- -- 12.1 --  HCT 33.3* -- -- 35.3* --  PLT 218 -- -- 213 --  APTT -- -- -- -- --  LABPROT 19.0* -- -- 17.7* 16.4*  INR 1.65* -- -- 1.50* 1.35  HEPARINUNFRC 0.48 -- 0.30 0.13* --  CREATININE 1.28* 1.38* -- 1.52* --  CKTOTAL -- -- -- -- --  CKMB -- -- -- -- --  TROPONINI -- -- -- -- --    Estimated Creatinine Clearance: 31.3 ml/min (by C-G formula based on Cr of 1.28).   Assessment: 85yof on coumadin pta (2mg  daily except 4mg  on Thursday) for afib. Has a history of GERD with esophageal stricture and admitted with poor PO intake/dysphagia. INR 5.23 on admission and coumadin placed on hold for possible GI procedures.  She is now s/p endoscopy and resumed coumadin with heparin. INR trending up at 1.65, heparin level at goal (HL=0.48), and Hg/hct, plt are stable.  Goal of Therapy:  Heparin level 0.3-0.5 INR 2-3 Monitor platelets by anticoagulation protocol: Yes   Plan:  1) Coumadin 4mg  x 1 2) Continue heparin gtt at current rate 2) Follow up INR, CBC and Heparin level in AM  Harland German, Pharm D 02/05/2012 8:15 AM

## 2012-02-05 NOTE — Progress Notes (Signed)
INITIAL ADULT NUTRITION ASSESSMENT Date: 02/05/2012   Time: 11:07 AM Reason for Assessment: Consult  INTERVENTION: 1. Ensure Complete po BID, each supplement provides 350 kcal and 13 grams of protein.  2. RD will continue to follow    DOCUMENTATION CODES Per approved criteria  -Severe malnutrition in the context of acute illness or injury    ASSESSMENT: Female 78 y.o.  Dx: CHF (congestive heart failure), NYHA class IV  Hx:  Past Medical History  Diagnosis Date  . Benign neoplasm of colon   . Diverticulosis of colon (without mention of hemorrhage)   . Reflux esophagitis   . Gastric polyp     history of  . GERD (gastroesophageal reflux disease)   . Stricture and stenosis of esophagus   . Reactive airway disease   . Anemia, iron deficiency   . CHF (congestive heart failure)   . Restless leg syndrome   . Nonischemic cardiomyopathy     EF 20-25% 01-23-12 / BiV AICD  . Personal history of sudden cardiac death successfully resuscitated     status post cardioverter- defibrillator  . Atrial fibrillation   . Diabetes mellitus   . Osteoarthritis   . Hypertension   . UTI (lower urinary tract infection)   . v45.02   . Chronic kidney disease (CKD), stage III (moderate)   . Hyperlipemia   . GERD with stricture   . Anticoagulated on Coumadin     Past Surgical History  Procedure Date  . Cardiac defibrillator placement 1999, 2001, 3.30.2012    [facility], Removal of previously implanted BiV ICD followed by insertion of BiV pacemaker with pacemaker pocket revision. without immediate procedural complications.   . Eye surgery 2002    cataract   . Total hip arthroplasty 2004    right  . Insert / replace / remove pacemaker   . Joint replacement   . Esophagogastroduodenoscopy (egd) with esophageal dilation 01/31/2012    Procedure: ESOPHAGOGASTRODUODENOSCOPY (EGD) WITH ESOPHAGEAL DILATION;  Surgeon: Rachael Fee, MD;  Location: [facility] ENDOSCOPY;  Service: Endoscopy;   Laterality: N/A;  . Esophagogastroduodenoscopy 02/01/2012    Procedure: ESOPHAGOGASTRODUODENOSCOPY (EGD);  Surgeon: Rachael Fee, MD;  Location: [facility] ENDOSCOPY;  Service: Endoscopy;  Laterality: N/A;     Related Meds:     . amiodarone  150 mg Intravenous Once  . cefTRIAXone (ROCEPHIN)  IV  1 g Intravenous Q24H  . digoxin  0.0625 mg Oral Daily  . levothyroxine  100 mcg Oral Daily  . [COMPLETED] potassium chloride  10 mEq Intravenous Q1 Hr x 4  . sodium chloride  250 mL Intravenous Once  . sodium chloride  10-40 mL Intracatheter Q12H  . [COMPLETED] warfarin  4 mg Oral ONCE-1800  . warfarin  4 mg Oral ONCE-1800  . Warfarin - Physician Dosing Inpatient   Does not apply q1800  . [DISCONTINUED] warfarin  5 mg Oral ONCE-1800     Ht: 5\' 2"  (157.5 cm)  Wt: 162 lb 0.6 oz (73.5 kg)  Ideal Wt: 50 kg  % Ideal Wt: 147%  Usual Wt: 175 lbs per pt report (several weeks ago) Wt Readings from Last 5 Encounters:  02/05/12 162 lb 0.6 oz (73.5 kg)  02/05/12 162 lb 0.6 oz (73.5 kg)  02/05/12 162 lb 0.6 oz (73.5 kg)  02/05/12 162 lb 0.6 oz (73.5 kg)  10/17/11 180 lb 11.2 oz (81.965 kg)    % Usual Wt: 90%  Body mass index is 29.64 kg/(m^2). Overweight per current BMI   Food/Nutrition Related  Hx: Pt reports 30 lb weight loss in the past several weeks  Labs:  CMP     Component Value Date/Time   NA 132* 02/05/2012 0430   K 3.5 02/05/2012 0430   CL 99 02/05/2012 0430   CO2 24 02/05/2012 0430   GLUCOSE 112* 02/05/2012 0430   BUN 36* 02/05/2012 0430   CREATININE 1.28* 02/05/2012 0430   CALCIUM 8.8 02/05/2012 0430   PROT 6.1 01/29/2012 1849   ALBUMIN 2.7* 01/29/2012 1849   AST 25 01/29/2012 1849   ALT 35 01/29/2012 1849   ALKPHOS 175* 01/29/2012 1849   BILITOT 0.9 01/29/2012 1849   GFRNONAA 38* 02/05/2012 0430   GFRAA 44* 02/05/2012 0430    Intake/Output Summary (Last 24 hours) at 02/05/12 1110 Last data filed at 02/05/12 0800  Gross per 24 hour  Intake  749.4 ml  Output    100  ml  Net  649.4 ml     Diet Order: General  Supplements/Tube Feeding: none   IVF:    sodium chloride Last Rate: 10 mL (02/04/12 1900)  amiodarone (NEXTERONE PREMIX) 360 mg/200 mL dextrose Last Rate: 0.5 mg/min (02/05/12 0308)  heparin Last Rate: 1,150 Units/hr (02/04/12 1900)    Estimated Nutritional Needs:   Kcal: 1600-1800 Protein: 40-55 gm  Fluid:  1.6- 1.8 L   Pt admitted with primary complaint of not being able to keep anything down. GI consulted and pt with partial obstruction of esophagus. EGD planned on 11/20 but was unable to be completed r/t pt respiratory issues, completed on 11/21. Esophagus was dilated, small HH was found. Reports swallowing better post EGD.  Rapid Response was called for pt with slurred speech and weakness. Likely TIA.   Speech evaluated pt and aphagia and dysphagia now resolved, regular diet.  Pt indicates that the past 3 weeks PTA she was very weak and unable to make any foods. She was only able to drink an Ensure once or twice a day. States she has lost ~30 lbs in the last 3 weeks. Weight hx shows 22 lb weight loss (admission weight of 158 lbs) about 12% of her body weight.   Pt meets criteria for severe malnutrition in the context of acute illness 2/2 weight loss of 12% in 3 weeks and meeting </=50% estimated energy needs for >/=5 days.   NUTRITION DIAGNOSIS:  Malnutrition r/t weakness/inability to swallow AEB weight loss and inability to meet nutrition needs  MONITORING/EVALUATION(Goals): Goal: Po intake to meet >/=90% estimated nutrition needs  Monitor: PO intake, weight, labs, swallowing ability  EDUCATION NEEDS: -No education needs identified at this time   Clarene Duke RD, LDN Pager [phone] After Hours pager [phone]  02/05/2012, 11:07 AM

## 2012-02-05 NOTE — Progress Notes (Signed)
VASCULAR LAB PRELIMINARY  PRELIMINARY  PRELIMINARY  PRELIMINARY  Carotid Dopplers completed.    Preliminary report:  There is no ICA stenosis.  Right vertebral artery flow is antegrade.  Left vertebral artery flow not insonated.  [name], 02/05/2012, 10:40 AM

## 2012-02-06 ENCOUNTER — Inpatient Hospital Stay (HOSPITAL_COMMUNITY): Payer: Medicare Other

## 2012-02-06 LAB — CBC
Platelets: 270 10*3/uL (ref 150–400)
RDW: 14.8 % (ref 11.5–15.5)
WBC: 13 10*3/uL — ABNORMAL HIGH (ref 4.0–10.5)

## 2012-02-06 LAB — CARBOXYHEMOGLOBIN
Carboxyhemoglobin: 1.1 % (ref 0.5–1.5)
Methemoglobin: 1 % (ref 0.0–1.5)
O2 Saturation: 67.1 %

## 2012-02-06 LAB — HEPARIN LEVEL (UNFRACTIONATED): Heparin Unfractionated: 0.41 IU/mL (ref 0.30–0.70)

## 2012-02-06 LAB — BASIC METABOLIC PANEL
BUN: 31 mg/dL — ABNORMAL HIGH (ref 6–23)
CO2: 23 mEq/L (ref 19–32)
Chloride: 101 mEq/L (ref 96–112)
Creatinine, Ser: 1.23 mg/dL — ABNORMAL HIGH (ref 0.50–1.10)
GFR calc Af Amer: 46 mL/min — ABNORMAL LOW (ref >90)

## 2012-02-06 LAB — LIPID PANEL
Cholesterol: 241 mg/dL — ABNORMAL HIGH (ref 0–200)
HDL: 19 mg/dL — ABNORMAL LOW (ref >39)
Total CHOL/HDL Ratio: 12.7 RATIO
Triglycerides: 143 mg/dL (ref <150)
VLDL: 29 mg/dL (ref 0–40)

## 2012-02-06 LAB — GLUCOSE, CAPILLARY: Glucose-Capillary: 105 mg/dL — ABNORMAL HIGH (ref 70–99)

## 2012-02-06 LAB — DIGOXIN LEVEL: Digoxin Level: 0.5 ng/mL — ABNORMAL LOW (ref 0.8–2.0)

## 2012-02-06 MED ORDER — WARFARIN SODIUM 4 MG PO TABS
4.0000 mg | ORAL_TABLET | Freq: Once | ORAL | Status: AC
  Administered 2012-02-06: 4 mg via ORAL
  Filled 2012-02-06: qty 1

## 2012-02-06 NOTE — Evaluation (Signed)
SLP has reviewed and agrees with student's note below.  Breck Coons [place].Ed [employer] [phone]  02/06/2012

## 2012-02-06 NOTE — Consult Note (Signed)
ANTICOAGULATION CONSULT NOTE  Pharmacy Consult for Coumadin/heparin Indication: atrial fibrillation  Allergies  Allergen Reactions  . Iodinated Diagnostic Agents     Unknown    Patient Measurements: Height: 5\' 2"  (157.5 cm) Weight: 167 lb 1.7 oz (75.8 kg) IBW/kg (Calculated) : 50.1   Vital Signs: Temp: 97.4 F (36.3 C) (11/26 1143) Temp src: Oral (11/26 1143) BP: 114/62 mmHg (11/26 1143) Pulse Rate: 84  (11/26 1143)  Labs:  Basename 02/06/12 0445 02/05/12 0430 02/04/12 1800 02/04/12 1300 02/04/12 0400  HGB 11.6* 11.4* -- -- --  HCT 34.2* 33.3* -- -- 35.3*  PLT 270 218 -- -- 213  APTT -- -- -- -- --  LABPROT 20.2* 19.0* -- -- 17.7*  INR 1.79* 1.65* -- -- 1.50*  HEPARINUNFRC 0.41 0.48 -- 0.30 --  CREATININE 1.23* 1.28* 1.38* -- --  CKTOTAL -- -- -- -- --  CKMB -- -- -- -- --  TROPONINI -- -- -- -- --    Estimated Creatinine Clearance: 33 ml/min (by C-G formula based on Cr of 1.23).   Assessment: 85yof on coumadin pta (2mg  daily except 4mg  on Thursday) for afib. Has a history of GERD with esophageal stricture and admitted with poor PO intake/dysphagia. INR 5.23 on admission and coumadin placed on hold for possible GI procedures.    She is now s/p endoscopy and noted with likely TIA/CVA and is on coumadin with heparin. INR trending up at 1.79, heparin level at goal (HL=0.41), and Hg/hct, plt are stable.  Goal of Therapy:  Heparin level 0.3-0.5 INR 2-3 Monitor platelets by anticoagulation protocol: Yes   Plan:  1) Coumadin 4mg  x 1 2) Continue heparin gtt at current rate 2) Follow up INR, CBC and Heparin level in AM  Harland German, Pharm D 02/06/2012 2:03 PM

## 2012-02-06 NOTE — Progress Notes (Signed)
OT Cancellation Note  Patient Details Name: MELENI DELAHUNT MRN: [id number] DOB: 11-May-1926   Cancelled Treatment:     Pt was up in chair earlier.  Too fatiqued for OT/PT & states stomach is bothering her.  Will check back later if schedule permits.    Damieon Armendariz .02/06/2012, 11:14 AM Marica Otter, OTR/L [phone] 02/06/2012

## 2012-02-06 NOTE — Progress Notes (Signed)
PT Cancellation Note  Patient Details Name: [name] MRN: [id number] DOB: December 25, 1926   Cancelled Treatment:    Reason Eval/Treat Not Completed: Fatigue/lethargy limiting ability to participate. Pt just back to bed and exhausted after having been up in the chair. Will attempt back as time and schedule allows.    [facility] HELEN 02/06/2012, 10:58 AM

## 2012-02-06 NOTE — Progress Notes (Signed)
Subjective: Patient was seen by me yesterday, however note not left. Today, she continues to have pervasive fatigue with any activity. She is not having any difficulty with swallowing and no change in mild RUE weakness.  Objective: Vital signs in last 24 hours: Temp:  [87.4 F (30.8 C)-98.2 F (36.8 C)] 87.4 F (30.8 C) (11/26 0754) Pulse Rate:  [73-101] 80  (11/26 0754) Resp:  [17-24] 17  (11/26 0400) BP: (92-130)/(47-85) 130/61 mmHg (11/26 0700) SpO2:  [95 %-98 %] 98 % (11/26 0754) Weight:  [75.8 kg (167 lb 1.7 oz)] 75.8 kg (167 lb 1.7 oz) (11/26 0500) Weight change: 2.3 kg (5 lb 1.1 oz)   Intake/Output from previous day: 11/25 0701 - 11/26 0700 In: 1788.9 [P.O.:860; I.V.:828.9; IV Piggyback:100] Out: 275 [Urine:275]   General appearance: alert, cooperative and no distress Resp: clear to auscultation bilaterally Cardio: slightly irregular rhythm with 1/6 SEM Neurologic: Motor: 4/5 RUE strength, otherwise 5/5 in other extremities  Lab Results:  Basename 02/06/12 0445 02/05/12 0430  WBC 13.0* 11.4*  HGB 11.6* 11.4*  HCT 34.2* 33.3*  PLT 270 218   BMET  Basename 02/06/12 0445 02/05/12 0430  NA 134* 132*  K 4.0 3.5  CL 101 99  CO2 23 24  GLUCOSE 105* 112*  BUN 31* 36*  CREATININE 1.23* 1.28*  CALCIUM 8.7 8.8   CMET CMP     Component Value Date/Time   NA 134* 02/06/2012 0445   K 4.0 02/06/2012 0445   CL 101 02/06/2012 0445   CO2 23 02/06/2012 0445   GLUCOSE 105* 02/06/2012 0445   BUN 31* 02/06/2012 0445   CREATININE 1.23* 02/06/2012 0445   CALCIUM 8.7 02/06/2012 0445   PROT 6.1 01/29/2012 1849   ALBUMIN 2.7* 01/29/2012 1849   AST 25 01/29/2012 1849   ALT 35 01/29/2012 1849   ALKPHOS 175* 01/29/2012 1849   BILITOT 0.9 01/29/2012 1849   GFRNONAA 39* 02/06/2012 0445   GFRAA 46* 02/06/2012 0445    CBG (last 3)   Basename 02/05/12 1646 02/05/12 1215 02/05/12 0822  GLUCAP 125* 116* 105*    INR RESULTS:   Lab Results  Component Value Date   INR  1.79* 02/06/2012   INR 1.65* 02/05/2012   INR 1.50* 02/04/2012     Studies/Results: Ct Head Wo Contrast  02/05/2012  *RADIOLOGY REPORT*  Clinical Data: Follow up stroke  CT HEAD WITHOUT CONTRAST  Technique:  Contiguous axial images were obtained from the base of the skull through the vertex without contrast.  Comparison: CT head 02/03/2012  Findings: Generalized atrophy.  Chronic microvascular ischemic changes in the white matter are stable.  No acute infarct is identified.  Negative for hemorrhage or mass.  No midline shift. Calvarium is intact.  IMPRESSION: Atrophy and moderately severe chronic microvascular ischemia in the white matter.  No acute infarct or hemorrhage.   Original Report Authenticated By: Janeece Riggers, M.D.     Medications: I have reviewed the patient's current medications.  Assessment/Plan: #1 Dyspnea: stable, and will transfer out of step down when off of IV amiodarone. #2 Aphasia: resolved and likely due to TIA versus minimal stroke. #3 DM2:  Stable on current meds.  LOS: 8 days   Jhamir Pickup G 02/06/2012, 8:51 AM

## 2012-02-06 NOTE — Progress Notes (Signed)
Stroke Team Rounding 78 year old female with a history of atrial fibrillation, diabetes mellitus, hypertension, hyperlipidemia and nonischemic cardiomyopathy (EF 20-25%), who was admitted on 01/29/2012 for workup of upper GI dysfunction including endoscopy. Patient has been on Coumadin for anticoagulation. Coumadin was suspended for endoscopy been restarted following the procedure. Neurology consultation was obtained because of new onset of slurred speech as well as expressive aphasia with waxing and waning of severity. Patient was last seen normal at 3:30 PM on 02/03/12. CT scan of her head showed no acute intracranial abnormality. Patient had no clinical history of previous stroke nor TIA.  Subjective: No family at bedside this am. Patient complains of stomach hurting. Reports hand still a little weak.  Objective: Vital signs in last 24 hours: Filed Vitals:   02/06/12 0400 02/06/12 0500 02/06/12 0700 02/06/12 0754  BP: 123/73  130/61   Pulse: 80   80  Temp:    87.4 F (30.8 C)  TempSrc:    Oral  Resp: 17     Height:      Weight:  75.8 kg (167 lb 1.7 oz)    SpO2: 96%   98%     Intake/Output from previous day: 11/25 0701 - 11/26 0700 In: 1788.9 [P.O.:860; I.V.:828.9; IV Piggyback:100] Out: 275 [Urine:275]  GENERAL EXAM: Patient is in no distress CARDIOVASCULAR: Regular rate and rhythm, no murmurs, no carotid bruits NEUROLOGIC: MENTAL STATUS: awake, alert, language fluent, comprehension intact, naming intact; ONLY ERROR WAS READING BASEBALL PLAYER AS "BASKETBALL PLAYER". CRANIAL NERVE: pupils equal and reactive to light, visual fields full to confrontation, extraocular muscles intact, no nystagmus, facial sensation and strength symmetric, uvula midline, shoulder shrug symmetric, tongue midline. MOTOR: normal bulk and tone, RIGHT ARM DRIFT, RIGHT TRICEPS AND GRIP WEAKNESS. LEFT ARM AND BLE FULL STRENGTH. SENSORY: normal and symmetric to light touch COORDINATION: finger-nose-finger  LIMITED ON RUE DUE TO WEAKNESS.  Lab Results:  Basename 02/06/12 0445 02/05/12 0430  WBC 13.0* 11.4*  HGB 11.6* 11.4*  HCT 34.2* 33.3*  PLT 270 218    BMET  Basename 02/06/12 0445 02/05/12 0430  NA 134* 132*  K 4.0 3.5  CL 101 99  CO2 23 24  GLUCOSE 105* 112*  BUN 31* 36*  CREATININE 1.23* 1.28*  CALCIUM 8.7 8.8    Lipid Panel     Component Value Date/Time   CHOL 241* 02/06/2012 0445   TRIG 143 02/06/2012 0445   HDL 19* 02/06/2012 0445   CHOLHDL 12.7 02/06/2012 0445   VLDL 29 02/06/2012 0445   LDLCALC 193* 02/06/2012 0445   HEMOGLOBIN A1C: No results found for this basename: HGBA1C     Studies/Results: Ct Head   02/05/3012 Atrophy and moderately severe chronic microvascular ischemia in the white matter. No acute infarct or hemorrhage. 02/03/2012 No acute cortically based infarct or intracranial hemorrhage identified.  Interval progression of focal right frontal lobe encephalomalacia, but appears chronic.  Chronically advanced white matter disease.     Chest Xray  02/03/2012  * PICC line tip is in the lower SVC.    02/03/2012 Improved right-sided aeration.  Either infection or asymmetric pulmonary edema.  Cardiomegaly with possible small bilateral pleural effusions.     MRI brain - cannot due to AICD  MRA head - cannot due to AICD  Carotid Doppler Right: mild to moderate soft plaque origin ICA. No signficant ICA stenosis. Vertebral artery flow is antegrade. Left: Mild soft plaque origin and proximal ICA and ECA. No significant ICA stenosis. Vertebral flow not  insonated.  TCD  2D echo  02/01/2012 EF 12-25% with akinesisinferoseptum, posterior wall, inferior wall and apex; severe hypokinesis elsewhere. The cavity size was severely dilated. Wall thickness was normal.  Medications:    . amiodarone  150 mg Intravenous Once  . cefTRIAXone (ROCEPHIN)  IV  1 g Intravenous Q24H  . digoxin  0.0625 mg Oral Daily  . feeding supplement  237 mL Oral BID BM  .  levothyroxine  100 mcg Oral Daily  . sodium chloride  250 mL Intravenous Once  . sodium chloride  10-40 mL Intracatheter Q12H  . [COMPLETED] warfarin  4 mg Oral ONCE-1800  . Warfarin - Physician Dosing Inpatient   Does not apply q1800      . sodium chloride 10 mL (02/04/12 1900)  . amiodarone (NEXTERONE PREMIX) 360 mg/200 mL dextrose 0.5 mg/min (02/06/12 0346)  . heparin 1,150 Units/hr (02/05/12 1620)    Physical Exam : pleasant elderly caucasian lady not in distress.Awake alert. Afebrile. Head is nontraumatic. Neck is supple without bruit. Hearing is normal. Cardiac exam no murmur or gallop. Lungs are clear to auscultation. Distal pulses are well felt.  Neurological Exam ; awake alert oriented to time place and person. Mild nonfluent speech with occasional word hesitancy but no paraphasic errors. Able to name and repeat quite well. Good comprehension. Extraocular moments are full range without nystagmus. Fundi were not visualized. Vision acuity and fields appear normal. Face is symmetric without weakness. Tongue is midline. Motor system exam reveals no upper or lower extremity drift. Fine finger movements are slightly diminished on the right. Orbits left over right approximately. Symmetric strength in both lower extremity is. Deep tendon flexes are 1+ symmetric. Plantars are downgoing. Sensation appears intact. Gait was not tested.  Therapy recommendations:  SNF  Assessment: 78 year old female with a history of atrial fibrillation, diabetes mellitus, hypertension, hyperlipidemia and nonischemic cardiomyopathy (EF 20-25%), who was admitted on 01/29/2012 for workup of upper GI dysfunction including endoscopy. Had expressive aphasia and right arm weakness on 02/03/12, while off coumadin (INR 1.3). Probable small left brain embolic infarct not seen on initial or repeat CT head, embolic secondary to afib. Cannot do MRI due to pacer.  Stroke Risk Factors - atrial fibrillation, diabetes mellitus,  hypertension, hyperlipidemia (LDL 193, not on statin PTA)   LOS: 8 days   Plan: - f/u A1c - Risk factor modification (goal SBP < 180, goal LDL < 100, goal A1c < 6) - continue heparin gtt with transition to coumadin for atrial fibrillation - f/u TCD - agree with plans for SNF at discharge - Stroke Service will sign off. Follow up with Dr. Pearlean Brownie, Stroke Clinic, in 2 months.   Annie Main, MSN, RN, ANVP-BC, ANP-BC, [facility] Pager: [phone] 02/06/2012 8:26 AM  I have personally examined this patient, reviewed pertinent data and developed plan of care and discussed her care with patient agree with above  Delia Heady, MD Medical Director [facility] Pager: [phone] 02/06/2012 8:26 AM

## 2012-02-06 NOTE — Evaluation (Signed)
Speech Language Pathology Evaluation Patient Details Name: [name] MRN: [id number] DOB: 03/15/1926 Today's Date: 02/06/2012 Time: 4098-1191 SLP Time Calculation (min): 18 min  Problem List:  Patient Active Problem List  Diagnosis  . POLYP, COLON  . HYPERCHOLESTEROLEMIA  . DYSLIPIDEMIA  . ANEMIA, IRON DEFICIENCY  . RESTLESS LEG SYNDROME  . HYPERTENSION  . CARDIOMYOPATHY, PRIMARY  . PAROXYSMAL VENTRICULAR TACHYCARDIA  . Atrial fibrillation  . Chronic systolic heart failure  . REACTIVE AIRWAY DISEASE  . ESOPHAGITIS, REFLUX  . ESOPHAGEAL STRICTURE  . GERD  . DIVERTICULOSIS, COLON  . GASTRIC POLYP, HX OF  . Biventricular cardiac pacemaker [employer]  . UTI (lower urinary tract infection)  . Weakness generalized  . Acute esophageal obstruction  . Dysphagia  . CHF (congestive heart failure), NYHA class IV  . Hypotension  . Acute on chronic renal failure  . CVA (cerebral infarction)  . Transient ischemic attack   Past Medical History:  Past Medical History  Diagnosis Date  . Benign neoplasm of colon   . Diverticulosis of colon (without mention of hemorrhage)   . Reflux esophagitis   . Gastric polyp     history of  . GERD (gastroesophageal reflux disease)   . Stricture and stenosis of esophagus   . Reactive airway disease   . Anemia, iron deficiency   . CHF (congestive heart failure)   . Restless leg syndrome   . Nonischemic cardiomyopathy     EF 20-25% 01-23-12 / BiV AICD  . Personal history of sudden cardiac death successfully resuscitated     status post cardioverter- defibrillator  . Atrial fibrillation   . Diabetes mellitus   . Osteoarthritis   . Hypertension   . UTI (lower urinary tract infection)   . v45.02   . Chronic kidney disease (CKD), stage III (moderate)   . Hyperlipemia   . GERD with stricture   . Anticoagulated on Coumadin    Past Surgical History:  Past Surgical History  Procedure Date  . Cardiac defibrillator placement 1999,  2001, 3.30.2012    [facility], Removal of previously implanted BiV ICD followed by insertion of BiV pacemaker with pacemaker pocket revision. without immediate procedural complications.   . Eye surgery 2002    cataract   . Total hip arthroplasty 2004    right  . Insert / replace / remove pacemaker   . Joint replacement   . Esophagogastroduodenoscopy (egd) with esophageal dilation 01/31/2012    Procedure: ESOPHAGOGASTRODUODENOSCOPY (EGD) WITH ESOPHAGEAL DILATION;  Surgeon: Rachael Fee, MD;  Location: [facility] ENDOSCOPY;  Service: Endoscopy;  Laterality: N/A;  . Esophagogastroduodenoscopy 02/01/2012    Procedure: ESOPHAGOGASTRODUODENOSCOPY (EGD);  Surgeon: Rachael Fee, MD;  Location[facility] ENDOSCOPY;  Service: Endoscopy;  Laterality: N/A;   HPI:  78 y/o female with history of atrial fibrillation, DM, HTN, hyperlipidemia, and non ischemic cardiomyopathy was admitted to [facility] on 01/29/12 for endoscopy. Coumadin suspended for procedure only and restarted.  02/03/12 onset with slurred speech as well as expressive aphasia.  CT indicates no acute intracranial abnormality. BSE on 11/24, seen for dysphagia treatment on 11/25 and then d/c as she was close to baseline.    Assessment / Plan / Recommendation Clinical Impression  Pt. seen for cognitive linguistic speech evaluation. Evaluation limited as transporters arrived to take pt. to radiology for procedure. Pt. reports her speech is much improved, however not back to baseline.  Pt. did not exhibit expressive language impairments during assesment.  Awareness, orientation, memory, naming, problem solving, motor  planning and speech intelligibility appear WFL for tasks assessed. Potential deficits are likely for higher level cognitive-linguistic tasks. Will continue to follow for diagnostic treatment to further assess higher level cognitive-linguistic abilities.     SLP Assessment  Patient needs continued Speech Lanaguage Pathology Services    Follow Up  Recommendations  Other (comment) (to be determined)    Frequency and Duration min 2x/week  2 weeks      SLP Goals  SLP Goals Potential to Achieve Goals: Good Progress/Goals/Alternative treatment plan discussed with pt/caregiver and they: No caregivers available SLP Goal #1: Pt. will demonstrate higher level cognitive tasks during further diagnostic assesmentwith minimal cues  SLP Goal #2: Pt. will improve executive functioning, planning, and organization with minimal verbal cues   SLP Evaluation Prior Functioning  Cognitive/Linguistic Baseline: Information not available Type of Home: House Lives With: Alone Available Help at Discharge: Family;Friend(s) Education:  (college) Vocation: Retired   IT consultant  Overall Cognitive Status: Impaired Arousal/Alertness: Awake/alert Orientation Level: Oriented X4 Attention: Sustained Memory: Appears intact Awareness: Appears intact Problem Solving: Appears intact Safety/Judgment: Appears intact    Comprehension  Auditory Comprehension Overall Auditory Comprehension: Appears within functional limits for tasks assessed Yes/No Questions: Not tested Commands: Not tested Conversation: Simple Visual Recognition/Discrimination Discrimination: Not tested Reading Comprehension Reading Status: Not tested    Expression Expression Primary Mode of Expression: Verbal Verbal Expression Overall Verbal Expression: Appears within functional limits for tasks assessed Initiation: No impairment Level of Generative/Spontaneous Verbalization: Conversation Naming: No impairment Pragmatics: No impairment Written Expression Dominant Hand: Right Written Expression: Not tested   Oral / Motor Oral Motor/Sensory Function Overall Oral Motor/Sensory Function: Appears within functional limits for tasks assessed Labial ROM: Reduced right Labial Symmetry: Abnormal symmetry right Labial Strength: Within Functional Limits Labial Sensation: Within Functional  Limits Lingual ROM: Within Functional Limits Lingual Symmetry: Within Functional Limits Lingual Strength: Within Functional Limits Lingual Sensation: Within Functional Limits Facial ROM: Within Functional Limits Facial Symmetry: Within Functional Limits Facial Strength: Within Functional Limits Facial Sensation: Within Functional Limits Velum: Within Functional Limits Mandible: Within Functional Limits Motor Speech Overall Motor Speech: Appears within functional limits for tasks assessed Respiration: Within functional limits Phonation: Normal Resonance: Within functional limits Articulation: Within functional limitis Intelligibility: Intelligible Motor Planning: Witnin functional limits Motor Speech Errors: Not applicable   GO     Theotis Burrow 02/06/2012, 1:16 PM

## 2012-02-07 ENCOUNTER — Inpatient Hospital Stay (HOSPITAL_COMMUNITY): Payer: Medicare Other

## 2012-02-07 DIAGNOSIS — I4891 Unspecified atrial fibrillation: Secondary | ICD-10-CM

## 2012-02-07 DIAGNOSIS — I509 Heart failure, unspecified: Secondary | ICD-10-CM

## 2012-02-07 LAB — CBC
MCH: 33.6 pg (ref 26.0–34.0)
MCV: 96.6 fL (ref 78.0–100.0)
Platelets: 268 10*3/uL (ref 150–400)
RDW: 15.1 % (ref 11.5–15.5)
WBC: 10.6 10*3/uL — ABNORMAL HIGH (ref 4.0–10.5)

## 2012-02-07 LAB — BASIC METABOLIC PANEL
CO2: 23 mEq/L (ref 19–32)
Chloride: 100 mEq/L (ref 96–112)
Creatinine, Ser: 1.13 mg/dL — ABNORMAL HIGH (ref 0.50–1.10)
Glucose, Bld: 92 mg/dL (ref 70–99)

## 2012-02-07 LAB — CARBOXYHEMOGLOBIN
Carboxyhemoglobin: 0.9 % (ref 0.5–1.5)
Methemoglobin: 1 % (ref 0.0–1.5)
O2 Saturation: 92.6 %

## 2012-02-07 LAB — PROTIME-INR: Prothrombin Time: 22.9 seconds — ABNORMAL HIGH (ref 11.6–15.2)

## 2012-02-07 LAB — GLUCOSE, CAPILLARY: Glucose-Capillary: 87 mg/dL (ref 70–99)

## 2012-02-07 MED ORDER — WARFARIN - PHARMACIST DOSING INPATIENT
Freq: Every day | Status: DC
  Administered 2012-02-07: 18:00:00

## 2012-02-07 MED ORDER — AMIODARONE HCL 200 MG PO TABS
200.0000 mg | ORAL_TABLET | Freq: Every day | ORAL | Status: DC
  Administered 2012-02-07 – 2012-02-12 (×6): 200 mg via ORAL
  Filled 2012-02-07 (×6): qty 1

## 2012-02-07 MED ORDER — WARFARIN SODIUM 2.5 MG PO TABS
2.5000 mg | ORAL_TABLET | Freq: Once | ORAL | Status: AC
  Administered 2012-02-07: 2.5 mg via ORAL
  Filled 2012-02-07: qty 1

## 2012-02-07 NOTE — Progress Notes (Signed)
Patient Name: [name] Date of Encounter: 02/07/2012     Principal Problem:  *CHF (congestive heart failure), NYHA class IV Active Problems:  ANEMIA, IRON DEFICIENCY  HYPERTENSION  CARDIOMYOPATHY, PRIMARY  Atrial fibrillation  ESOPHAGEAL STRICTURE  Acute esophageal obstruction  Dysphagia  Hypotension  Acute on chronic renal failure  CVA (cerebral infarction)  Transient ischemic attack    SUBJECTIVE  Patient seen for Dr. Johney Frame today.  Currently stable but HR remains moderately elevated.  No chest pain.  Is able to speak with me.    CURRENT MEDS    . amiodarone  150 mg Intravenous Once  . cefTRIAXone (ROCEPHIN)  IV  1 g Intravenous Q24H  . digoxin  0.0625 mg Oral Daily  . feeding supplement  237 mL Oral BID BM  . levothyroxine  100 mcg Oral Daily  . sodium chloride  250 mL Intravenous Once  . sodium chloride  10-40 mL Intracatheter Q12H  . warfarin  2.5 mg Oral ONCE-1800  . [COMPLETED] warfarin  4 mg Oral ONCE-1800  . Warfarin - Pharmacist Dosing Inpatient   Does not apply q1800  . [DISCONTINUED] Warfarin - Physician Dosing Inpatient   Does not apply q1800    OBJECTIVE  Filed Vitals:   02/07/12 0400 02/07/12 0500 02/07/12 0800 02/07/12 0815  BP: 127/67  111/73   Pulse: 75  88 97  Temp:    97.3 F (36.3 C)  TempSrc:    Oral  Resp: 17  14   Height:      Weight:  166 lb 0.1 oz (75.3 kg)    SpO2: 98%  95% 98%    Intake/Output Summary (Last 24 hours) at 02/07/12 1105 Last data filed at 02/07/12 1000  Gross per 24 hour  Intake 1192.2 ml  Output    525 ml  Net  667.2 ml   Filed Weights   02/05/12 0303 02/06/12 0500 02/07/12 0500  Weight: 162 lb 0.6 oz (73.5 kg) 167 lb 1.7 oz (75.8 kg) 166 lb 0.1 oz (75.3 kg)    PHYSICAL EXAM  General: Pleasant, NAD. Neuro: Alert and oriented X 3. Moves all extremities spontaneously. Psych: Normal affect. HEENT:  Normal  Neck: Supple without bruits or JVD. Lungs: Clear but on sides Heart: Occasionally  irregular.   Abdomen: Soft, non-tender, non-distended, BS + x 4.  Extremities: No clubbing, cyanosis or edema. DP/PT/Radials 2+ and equal bilaterally.  Accessory Clinical Findings  CBC  Basename 02/07/12 0500 02/06/12 0445  WBC 10.6* 13.0*  NEUTROABS -- --  HGB 10.8* 11.6*  HCT 31.0* 34.2*  MCV 96.6 95.3  PLT 268 270   Basic Metabolic Panel  Basename 02/07/12 0500 02/06/12 0445  NA 134* 134*  K 3.8 4.0  CL 100 101  CO2 23 23  GLUCOSE 92 105*  BUN 28* 31*  CREATININE 1.13* 1.23*  CALCIUM 8.8 8.7  MG -- --  PHOS -- --   Liver Function Tests No results found for this basename: AST:2,ALT:2,ALKPHOS:2,BILITOT:2,PROT:2,ALBUMIN:2 in the last 72 hours No results found for this basename: LIPASE:2,AMYLASE:2 in the last 72 hours Cardiac Enzymes No results found for this basename: CKTOTAL:3,CKMB:3,CKMBINDEX:3,TROPONINI:3 in the last 72 hours BNP No components found with this basename: POCBNP:3 D-Dimer No results found for this basename: DDIMER:2 in the last 72 hours Hemoglobin A1C  Basename 02/06/12 0445  HGBA1C 5.5   Fasting Lipid Panel  Basename 02/06/12 0445  CHOL 241*  HDL 19*  LDLCALC 193*  TRIG 143  CHOLHDL 12.7  LDLDIRECT --  Thyroid Function Tests No results found for this basename: TSH,T4TOTAL,FREET3,T3FREE,THYROIDAB in the last 72 hours   Radiology/Studies  Abd 1 View (kub)  01/29/2012  *RADIOLOGY REPORT*  Clinical Data: Nausea, vomiting  ABDOMEN - 1 VIEW  Comparison: 04/30/2008  Findings: Right hip arthroplasty components partially seen.  There is a mild dextroscoliosis of the lumbar spine with multilevel degenerative changes. Small bowel decompressed.  There is a normal distribution of gas and stool throughout the colon and distending the rectum.  Visualized lung bases clear.  Bilateral pelvic phleboliths.  IMPRESSION:  Nonobstructive bowel gas pattern   Original Report Authenticated By: D. Andria Rhein, MD    Ct Head Wo Contrast  02/05/2012   *RADIOLOGY REPORT*  Clinical Data: Follow up stroke  CT HEAD WITHOUT CONTRAST  Technique:  Contiguous axial images were obtained from the base of the skull through the vertex without contrast.  Comparison: CT head 02/03/2012  Findings: Generalized atrophy.  Chronic microvascular ischemic changes in the white matter are stable.  No acute infarct is identified.  Negative for hemorrhage or mass.  No midline shift. Calvarium is intact.  IMPRESSION: Atrophy and moderately severe chronic microvascular ischemia in the white matter.  No acute infarct or hemorrhage.   Original Report Authenticated By: Janeece Riggers, M.D.    Ct Head Wo Contrast  02/03/2012  *RADIOLOGY REPORT*  Clinical Data: 78 year old female with atrial fibrillation on Coumadin, subtherapeutic, slurred speech.  CT HEAD WITHOUT CONTRAST  Technique:  Contiguous axial images were obtained from the base of the skull through the vertex without contrast.  Comparison: Head CTs 10/14/2011 and earlier.  Findings: Visualized paranasal sinuses and mastoids are clear.  No acute orbit or scalp soft tissue findings. No acute osseous abnormality identified.  Calcified atherosclerosis at the skull base.  Stable cerebral volume.  No ventriculomegaly.  Cortical encephalomalacia in the anterior right frontal lobe on image 19 seems increased, but appears chronic. Patchy and confluent periventricular and subcortical white matter hypodensity in the cerebral hemispheres has not significantly changed.  No acute cortically based infarct identified. No midline shift, mass effect, or evidence of mass lesion.  No acute intracranial hemorrhage identified.  No suspicious intracranial vascular hyperdensity.  IMPRESSION: No acute cortically based infarct or intracranial hemorrhage identified.  Interval progression of focal right frontal lobe encephalomalacia, but appears chronic.  Chronically advanced white matter disease.   Original Report Authenticated By: Erskine Speed, M.D.    Dg  Chest Port 1 View  02/03/2012  *RADIOLOGY REPORT*  Clinical Data: CHF and status post PICC line placement.  PORTABLE CHEST - 1 VIEW  Comparison: 1235 hours  Findings: Portable film at 1603 hours demonstrates interval placement of a right upper extremity PICC line.  The tip is located in the distal SVC.  Lungs show relatively stable CHF.  IMPRESSION: PICC line tip is in the lower SVC.   Original Report Authenticated By: Irish Lack, M.D.    Dg Chest Port 1 View  02/03/2012  *RADIOLOGY REPORT*  Clinical Data: Dyspnea.  PORTABLE CHEST - 1 VIEW  Comparison: 01/31/2012  Findings: Pacer / AICD device.  Mild cardiomegaly.  Probable small bilateral pleural effusions. No pneumothorax.  Patchy interstitial and airspace disease is improved on the right.  Mild left base volume loss. Glenohumeral joint osteoarthritis bilaterally.  IMPRESSION: Improved right-sided aeration.  Either infection or asymmetric pulmonary edema.  Cardiomegaly with possible small bilateral pleural effusions.   Original Report Authenticated By: Jeronimo Greaves, M.D.    Dg Chest Port 1v Same  Day  01/31/2012  *RADIOLOGY REPORT*  Clinical Data: Shortness of breath  PORTABLE CHEST - 1 VIEW SAME DAY  Comparison: 06/10/2010  Findings: Left subclavian AICD stable position.  Stable cardiomegaly.  New asymmetric airspace opacities, centrally throughout much of the right lung, and to a lesser degree at the left lung base.  No definite effusion.  Atheromatous aorta. Degenerative changes of bilateral shoulders.  IMPRESSION:  1.  Asymmetric airspace infiltrates or edema, right greater than left. 2.  Stable cardiomegaly and postop changes.   Original Report Authenticated By: D. Andria Rhein, MD     ASSESSMENT AND PLAN   1.  Afib  -- not sure if she is in atrial fib or not.  Very difficult to tell.  Some beats appear however to be sinus.  Was on Amiodarone at home at 200mg  per day, and if she is taking PO then this could be switched.  I will order.  Will  get 12 lead ECG.  Warfarin has been started back and she is on UFH.      2.  CHF  -- I and O are slightly positive----BUN/Cr are improved.  CVP is 5.  Would be a little concerned about ACE with NSAIDS used daily, especially if volume is low.        Signed, Shawnie Pons MD, [facility], FSCAI

## 2012-02-07 NOTE — Clinical Social Work Note (Signed)
Clinical Social Worker spoke with patient's daughter, [name] regarding confirmation on SNF and they are confirming [name]. CSW spoke with Wille Celeste, in admissions at [facility] regarding confirmation. CSW will continue to follow and facilitate discharge when medically stable.   Rozetta Nunnery MSW, [employer] [phone]

## 2012-02-07 NOTE — Progress Notes (Signed)
Physical Therapy Treatment Patient Details Name: [name] MRN: [id number] DOB: [birth year] Today's Date: 02/07/2012 Time: 1150-1230 PT Time Calculation (min): 40 min  PT Assessment / Plan / Recommendation Comments on Treatment Session  Able to ambulate today. Needs a lot of encouragement for participation and progression of exercises. Educated on the benefits of ambulation and exercise to improve her endurance and decrease her pain.     Follow Up Recommendations  SNF     Does the patient have the potential to tolerate intense rehabilitation     Barriers to Discharge        Equipment Recommendations  None recommended by PT    Recommendations for Other Services    Frequency Min 3X/week   Plan Discharge plan remains appropriate;Frequency remains appropriate    Precautions / Restrictions Precautions Precautions: Fall Restrictions Weight Bearing Restrictions: No   Pertinent Vitals/Pain C/o back pain throughout session    Mobility  Bed Mobility Rolling Left: 4: Min assist (assist to reach with RUE for rail) Left Sidelying to Sit: 3: Mod assist;HOB flat Sitting - Scoot to Edge of Bed: 4: Min guard Details for Bed Mobility Assistance: cues for sequencing and assist to use RUE, min-mod facilitation to bring trunk upright; pt able to scoot with min verbal cues Transfers Transfers: Stand to Sit;Sit to Stand (3 trials) Sit to Stand: 3: Mod assist;From chair/3-in-1;From bed;With upper extremity assist;With armrests Stand to Sit: 3: Mod assist;To chair/3-in-1;With upper extremity assist;With armrests Details for Transfer Assistance: variable mod-min facilitation for power up and stability; cues for tall posture and safe hand placement Ambulation/Gait Ambulation/Gait Assistance: 4: Min assist Ambulation Distance (Feet): 25 Feet Assistive device: Rolling walker Ambulation/Gait Assistance Details: cues for tall posture and safe speed with RW; as pt fatigues she becomes impulsive  and insists on needing a chair to rest Gait Pattern: Trunk flexed;Shuffle;Step-through pattern General Gait Details: downward gaze  Stairs: No Wheelchair Mobility Wheelchair Mobility: No    Exercises General Exercises - Lower Extremity Ankle Circles/Pumps: AROM;Both;20 reps;Supine    PT Goals Acute Rehab PT Goals Time For Goal Achievement: 02/14/12 PT Goal: Supine/Side to Sit - Progress: Progressing toward goal PT Goal: Sit to Stand - Progress: Progressing toward goal PT Goal: Stand to Sit - Progress: Progressing toward goal PT Transfer Goal: Bed to Chair/Chair to Bed - Progress: Progressing toward goal PT Goal: Stand - Progress: Progressing toward goal PT Goal: Ambulate - Progress: Progressing toward goal PT Goal: Perform Home Exercise Program - Progress: Progressing toward goal  Visit Information  Last PT Received On: 02/07/12 Assistance Needed: +2    Subjective Data  Subjective: I just can't do it.  Patient Stated Goal: doesn't want to get up   Cognition  Overall Cognitive Status: Appears within functional limits for tasks assessed/performed Arousal/Alertness: Awake/alert Orientation Level: Appears intact for tasks assessed Behavior During Session: [facility] for tasks performed Cognition - Other Comments: needs a lot of encouragement, self limiting    Balance  Static Standing Balance Static Standing - Balance Support: Bilateral upper extremity supported Static Standing - Level of Assistance: 5: Stand by assistance Static Standing - Comment/# of Minutes: pt stood at the sink with elbows propped on the counter with significant forward flexion for approx 4 min., able to stand with tall posture with bilateral upper extremity support for less than 1-2 seconds without collapsing back to her elbows with fatigue reporting she can't do it  End of Session PT - End of Session Equipment Utilized During Treatment: Gait belt Activity  Tolerance: Patient limited by fatigue;Patient tolerated  treatment well Patient left: in chair;with call bell/phone within reach Nurse Communication: Mobility status   GP     [facility] HELEN 02/07/2012, 1:50 PM

## 2012-02-07 NOTE — Progress Notes (Signed)
Subjective: Having difficulty swallowing solid foods, and has a history of vomiting with attempts at barium ingestion. Dyspnea at rest when sitting in chair.  Objective: Vital signs in last 24 hours: Temp:  [97.2 F (36.2 C)-97.9 F (36.6 C)] 97.9 F (36.6 C) (11/27 1137) Pulse Rate:  [75-100] 97  (11/27 1137) Resp:  [14-20] 14  (11/27 0800) BP: (102-129)/(61-90) 129/88 mmHg (11/27 1137) SpO2:  [94 %-99 %] 99 % (11/27 1137) Weight:  [75.3 kg (166 lb 0.1 oz)] 75.3 kg (166 lb 0.1 oz) (11/27 0500) Weight change: -0.5 kg (-1 lb 1.6 oz)   Intake/Output from previous day: 11/26 0701 - 11/27 0700 In: 1128.6 [P.O.:200; I.V.:878.6; IV Piggyback:50] Out: 425 [Urine:425]   General appearance: alert, cooperative and no distress Resp: clear to auscultation bilaterally Cardio: slightly irregular rhythm with 1/6 SEM GI: soft, non-tender; bowel sounds normal; no masses,  no organomegaly Extremities: extremities normal, atraumatic, no cyanosis or edema  Lab Results:  Basename 02/07/12 0500 02/06/12 0445  WBC 10.6* 13.0*  HGB 10.8* 11.6*  HCT 31.0* 34.2*  PLT 268 270   BMET  Basename 02/07/12 0500 02/06/12 0445  NA 134* 134*  K 3.8 4.0  CL 100 101  CO2 23 23  GLUCOSE 92 105*  BUN 28* 31*  CREATININE 1.13* 1.23*  CALCIUM 8.8 8.7   CMET CMP     Component Value Date/Time   NA 134* 02/07/2012 0500   K 3.8 02/07/2012 0500   CL 100 02/07/2012 0500   CO2 23 02/07/2012 0500   GLUCOSE 92 02/07/2012 0500   BUN 28* 02/07/2012 0500   CREATININE 1.13* 02/07/2012 0500   CALCIUM 8.8 02/07/2012 0500   PROT 6.1 01/29/2012 1849   ALBUMIN 2.7* 01/29/2012 1849   AST 25 01/29/2012 1849   ALT 35 01/29/2012 1849   ALKPHOS 175* 01/29/2012 1849   BILITOT 0.9 01/29/2012 1849   GFRNONAA 44* 02/07/2012 0500   GFRAA 51* 02/07/2012 0500    CBG (last 3)   Basename 02/07/12 0815 02/06/12 1147 02/06/12 0912  GLUCAP 87 105* 97    INR RESULTS:   Lab Results  Component Value Date   INR  2.13* 02/07/2012   INR 1.79* 02/06/2012   INR 1.65* 02/05/2012     Studies/Results: Ct Head Wo Contrast  02/05/2012  *RADIOLOGY REPORT*  Clinical Data: Follow up stroke  CT HEAD WITHOUT CONTRAST  Technique:  Contiguous axial images were obtained from the base of the skull through the vertex without contrast.  Comparison: CT head 02/03/2012  Findings: Generalized atrophy.  Chronic microvascular ischemic changes in the white matter are stable.  No acute infarct is identified.  Negative for hemorrhage or mass.  No midline shift. Calvarium is intact.  IMPRESSION: Atrophy and moderately severe chronic microvascular ischemia in the white matter.  No acute infarct or hemorrhage.   Original Report Authenticated By: Janeece Riggers, M.D.     Medications: I have reviewed the patient's current medications.  Assessment/Plan: #1 Dysphagia:  Worsened over past few days and declines a barium swallow test, so will ask  GI to reevaluate her. #2 UTI:  Fully treated so will stop rocephin. #3 CHF: improving, and will transfer out of stepdown today, likely to SNF on Friday 11/29.  LOS: 9 days   [name] G 02/07/2012, 12:49 PM

## 2012-02-07 NOTE — Evaluation (Signed)
Occupational Therapy Evaluation Patient Details Name: [name] MRN: [id number] DOB: 1926-12-28 Today's Date: 02/07/2012 Time: 7829-5621 OT Time Calculation (min): 34 min  OT Assessment / Plan / Recommendation Clinical Impression  78 y/o female with history of atrial fibrillation, DM, HTN, hyperlipidemia, and non ischemic cardiomyopathy was admitted to [facility] on 01/29/12 for endoscopy. Coumadin suspended for procedure only and restarted.  02/03/12 onset with slurred speech as well as expressive aphasia.  CT indicates no acute intracranial abnormality. Pt. presents to OT with the below listed deficits. She is very self limiting and requires max encouragement to participate.  She will benefit from acute OT to maximize safety and independence with BADLs to allow her to return to min A level with SNF level rehab    OT Assessment  Patient needs continued OT Services    Follow Up Recommendations  SNF    Barriers to Discharge Decreased caregiver support    Equipment Recommendations  None recommended by PT;None recommended by OT    Recommendations for Other Services    Frequency  Min 2X/week    Precautions / Restrictions Precautions Precautions: Fall Restrictions Weight Bearing Restrictions: No       ADL  Eating/Feeding: Independent Where Assessed - Eating/Feeding: Chair Grooming: Teeth care;Brushing hair;Moderate assistance (for thoroughness) Where Assessed - Grooming: Supported standing Upper Body Bathing: Moderate assistance Where Assessed - Upper Body Bathing: Supported sitting Lower Body Bathing: Maximal assistance Where Assessed - Lower Body Bathing: Supported sit to stand Upper Body Dressing: Maximal assistance Where Assessed - Upper Body Dressing: Supported sitting Lower Body Dressing: +1 Total assistance Where Assessed - Lower Body Dressing: Supported sit to Pharmacist, hospital: Moderate assistance Toilet Transfer Method: Sit to stand Toilet Transfer Equipment:  Comfort height toilet Toileting - Clothing Manipulation and Hygiene: +1 Total assistance Where Assessed - Toileting Clothing Manipulation and Hygiene: Standing Equipment Used: Rolling walker;Gait belt Transfers/Ambulation Related to ADLs: sit to stand with mod A (occasional min A) and ambulates with min A.  requires 2+ assist due to lack of motivation and pt. will insist on sitting ADL Comments: Pt. unable to access feet for LB ADLs - minimal effort.  Pt. stood at sink to brush teeth and brush hair, but required maximal encouragement to do so and stood stooped over at sink supporting self on elbows and did not perform either task thoroughly    OT Diagnosis: Generalized weakness;Acute pain  OT Problem List: Decreased strength;Decreased activity tolerance;Decreased range of motion;Impaired balance (sitting and/or standing);Decreased knowledge of use of DME or AE;Cardiopulmonary status limiting activity;Pain OT Treatment Interventions: Self-care/ADL training;DME and/or AE instruction;Therapeutic activities;Patient/family education;Balance training   OT Goals Acute Rehab OT Goals OT Goal Formulation: With patient Time For Goal Achievement: 02/21/12 Potential to Achieve Goals: Good ADL Goals Pt Will Perform Grooming: with min assist;Standing at sink ADL Goal: Grooming - Progress: Goal set today Pt Will Perform Upper Body Bathing: with min assist;Sitting, chair ADL Goal: Upper Body Bathing - Progress: Goal set today Pt Will Perform Lower Body Bathing: with mod assist;Sit to stand from chair;Sit to stand from bed ADL Goal: Lower Body Bathing - Progress: Goal set today Pt Will Transfer to Toilet: with min assist;Ambulation;Comfort height toilet ADL Goal: Toilet Transfer - Progress: Goal set today Pt Will Perform Toileting - Clothing Manipulation: with min assist ADL Goal: Toileting - Clothing Manipulation - Progress: Goal set today Additional ADL Goal #1: Pt. will participate in 25 mins  therapeutic activity with no more than 2 rest breaks ADL Goal: Additional  Goal #1 - Progress: Goal set today  Visit Information  Last OT Received On: 02/07/12 Assistance Needed: +2 PT/OT Co-Evaluation/Treatment: Yes    Subjective Data  Subjective: "Oh, honey.  I just can't.  I'm too weak" Patient Stated Goal: To get back in bed   Prior Functioning     Home Living Lives With: Alone Available Help at Discharge: Family;Friend(s) Type of Home: House Home Access: Stairs to enter [employer] of Steps: 1 Entrance Stairs-Rails: Right Home Layout: One level Bathroom Shower/Tub: Walk-in shower;Tub/shower unit Teacher, early years/pre: Yes How Accessible: Accessible via walker Home Adaptive Equipment: Walker - four wheeled Prior Function Level of Independence: Independent with assistive device(s) Able to Take Stairs?: Yes Driving: No Vocation: Retired Musician: No difficulties Dominant Hand: Right         Vision/Perception     Cognition  Overall Cognitive Status: Appears within functional limits for tasks assessed/performed Arousal/Alertness: Awake/alert Orientation Level: Appears intact for tasks assessed Behavior During Session: [facility] for tasks performed Cognition - Other Comments: needs a lot of encouragement, self limiting    Extremity/Trunk Assessment Right Upper Extremity Assessment RUE ROM/Strength/Tone: Deficits RUE ROM/Strength/Tone Deficits: shoulder appears to be 2-/5; elbow distally at least 4/5.  Strength of Lt. shoulder difficult to accurately asses due to self limiting behavior.  Pt. states it's from the stroke, but upon further questioning, she does acknowledge weakness PTA.  Elbow distally with isolated movement RUE Sensation: WFL - Light Touch RUE Coordination: WFL - fine motor Left Upper Extremity Assessment LUE ROM/Strength/Tone: Deficits LUE ROM/Strength/Tone Deficits: strength grossly 4-/5 -  4/5 LUE Sensation: WFL - Light Touch LUE Coordination: WFL - gross/fine motor Trunk Assessment Trunk Assessment: Other exceptions Trunk Exceptions: Pt. maintains flexed posture.  Max cues/encouragement to sit and stand upright     Mobility Bed Mobility Bed Mobility: Sitting - Scoot to Edge of Bed;Rolling Left;Left Sidelying to Sit Rolling Left: 4: Min assist Left Sidelying to Sit: 3: Mod assist;HOB flat Sitting - Scoot to Edge of Bed: 4: Min guard Details for Bed Mobility Assistance: cues for sequencing and assist to use RUE, min-mod facilitation to bring trunk upright; pt able to scoot with min verbal cues Transfers Transfers: Sit to Stand;Stand to Sit Sit to Stand: 3: Mod assist;From chair/3-in-1;From bed;With upper extremity assist;With armrests Stand to Sit: 3: Mod assist;To chair/3-in-1;With upper extremity assist;With armrests Details for Transfer Assistance: variable mod-min facilitation for power up and stability; cues for tall posture and safe hand placement     Shoulder Instructions     Exercise General Exercises - Lower Extremity Ankle Circles/Pumps: AROM;Both;20 reps;Supine   Balance Balance Balance Assessed: Yes Static Standing Balance Static Standing - Balance Support: Bilateral upper extremity supported Static Standing - Level of Assistance: 5: Stand by assistance Static Standing - Comment/# of Minutes: pt stood at the sink with elbows propped on the counter with significant forward flexion for approx 4 min., able to stand with tall posture with bilateral upper extremity support for less than 1-2 seconds without collapsing back to her elbows with fatigue reporting she can't do it   End of Session OT - End of Session Equipment Utilized During Treatment: Gait belt Activity Tolerance: Other (comment);Patient limited by fatigue (self limiting behaviors) Patient left: in chair;with call bell/phone within reach Nurse Communication: Mobility status  GO     Bearett Porcaro,  Ursula Alert M 02/07/2012, 2:25 PM

## 2012-02-07 NOTE — Progress Notes (Signed)
ANTICOAGULATION CONSULT NOTE  Pharmacy Consult for Heparin + Warfarin Indication: Hx Afib and possible new CVA this admit  Allergies  Allergen Reactions  . Iodinated Diagnostic Agents     Unknown    Patient Measurements: Height: 5\' 2"  (157.5 cm) Weight: 166 lb 0.1 oz (75.3 kg) IBW/kg (Calculated) : 50.1   Vital Signs: Temp: 97.2 F (36.2 C) (11/27 0300) Temp src: Oral (11/27 0300) BP: 127/67 mmHg (11/27 0400) Pulse Rate: 75  (11/27 0400)  Labs:  Basename 02/07/12 0500 02/06/12 0445 02/05/12 0430  HGB 10.8* 11.6* --  HCT 31.0* 34.2* 33.3*  PLT 268 270 218  APTT -- -- --  LABPROT 22.9* 20.2* 19.0*  INR 2.13* 1.79* 1.65*  HEPARINUNFRC 0.47 0.41 0.48  CREATININE 1.13* 1.23* 1.28*  CKTOTAL -- -- --  CKMB -- -- --  TROPONINI -- -- --    Estimated Creatinine Clearance: 35.8 ml/min (by C-G formula based on Cr of 1.13).   Assessment: 78 y.o. F on warfarin PTA (2mg  daily except 4mg  on Thursday) for hx Afib and admitted with elevated INR of 5.23 likely related to poor po intake/dysphagia PTA in the setting of hx GERD and esophageal stricture. Warfarin initially held and reversed with Vit K for GI procedures -- then resumed with heparin bridge on 11/22. The patient was noted to have slurred speech and expressive aphasia on 11/23 while off of warfarin -- likely TIA vs small CVA.  Heparin level and INR this morning is therapeutic (HL 0.47, goal of 0.3-0.5 and INR 2.13, goal of 2-3). Hgb/Hct slight drop -- no s/sx of bleeding noted. Will continue heparin drip today with plans to d/c tomorrow a.m if INR remains therapeutic. Heparin infusing at the appropriate rate. The patient is noted to have been re-educated on warfarin this admission.   Goal of Therapy:  Heparin level 0.3-0.5 INR 2-3 Monitor platelets by anticoagulation protocol: Yes   Plan:  1. Continue heparin at current rate of 1150 units/hr 2. Warfarin 2.5 mg x 1 dose at 1800 today 3. Will continue to monitor for any  signs/symptoms of bleeding and will follow up with PT/INR in the a.m.   Georgina Pillion, PharmD, BCPS Clinical Pharmacist Pager: [phone] 02/07/2012 7:51 AM

## 2012-02-08 LAB — BASIC METABOLIC PANEL
BUN: 27 mg/dL — ABNORMAL HIGH (ref 6–23)
Creatinine, Ser: 1.34 mg/dL — ABNORMAL HIGH (ref 0.50–1.10)
GFR calc Af Amer: 41 mL/min — ABNORMAL LOW (ref >90)
GFR calc non Af Amer: 36 mL/min — ABNORMAL LOW (ref >90)
Potassium: 4 mEq/L (ref 3.5–5.1)

## 2012-02-08 LAB — CBC
HCT: 30.5 % — ABNORMAL LOW (ref 36.0–46.0)
Hemoglobin: 10.9 g/dL — ABNORMAL LOW (ref 12.0–15.0)
WBC: 10 10*3/uL (ref 4.0–10.5)

## 2012-02-08 LAB — GLUCOSE, CAPILLARY: Glucose-Capillary: 85 mg/dL (ref 70–99)

## 2012-02-08 LAB — HEPARIN LEVEL (UNFRACTIONATED): Heparin Unfractionated: 0.44 IU/mL (ref 0.30–0.70)

## 2012-02-08 LAB — PROTIME-INR: Prothrombin Time: 27.3 seconds — ABNORMAL HIGH (ref 11.6–15.2)

## 2012-02-08 MED ORDER — FUROSEMIDE 10 MG/ML IJ SOLN
40.0000 mg | Freq: Once | INTRAMUSCULAR | Status: AC
  Administered 2012-02-08: 40 mg via INTRAVENOUS
  Filled 2012-02-08: qty 4

## 2012-02-08 MED ORDER — WHITE PETROLATUM GEL
Status: AC
  Administered 2012-02-08: 14:00:00
  Filled 2012-02-08: qty 5

## 2012-02-08 MED ORDER — WARFARIN SODIUM 2 MG PO TABS
2.0000 mg | ORAL_TABLET | Freq: Once | ORAL | Status: AC
  Administered 2012-02-08: 2 mg via ORAL
  Filled 2012-02-08: qty 1

## 2012-02-08 MED ORDER — CARVEDILOL 3.125 MG PO TABS
3.1250 mg | ORAL_TABLET | Freq: Two times a day (BID) | ORAL | Status: DC
  Administered 2012-02-08 – 2012-02-12 (×8): 3.125 mg via ORAL
  Filled 2012-02-08 (×10): qty 1

## 2012-02-08 MED ORDER — ALTEPLASE 2 MG IJ SOLR
2.0000 mg | Freq: Once | INTRAMUSCULAR | Status: AC
  Administered 2012-02-08: 2 mg
  Filled 2012-02-08: qty 2

## 2012-02-08 NOTE — Consult Note (Signed)
Referring Provider: No ref. provider found Primary Care Physician:  Garlan Fillers, MD Primary Gastroenterologist:  Dr.John Marina Goodell  Reason for Consultation:  Dysphagia  HPI: [name] is a 78 y.o. female with complicated medical history, currently admitted on November 18 with congestive heart failure, she also has a nonischemic cardiomyopathy severe with EF of 20-25% and is chronically anticoagulated due to history of atrial/flutter. She was seen by GI earlier this admission with complaints of solid food dysphagia, and underwent upper endoscopy with Dr. Wendall Papa on November 21 while off of Coumadin and was found to have a smooth stricture at the GE junction which was dilated # 20 balloon also noted to have a slightly tortuous distal esophagus. Patient has prior history of esophageal stricture requiring dilation for 5 years ago as well. Unfortunately while she was being re\re anticoagulated she had an episode on 1123 with development of expressive aphasia and slurred speech. She was seen in consultation by neuro and had CT done which was negative but was unable to have MRI due to pacer/defibrillator area was felt that she probably had a small left brain an embolic infarct it was not seen on CT. She was placed on a heparin drip and has since been re\re anticoagulated with Coumadin. She was seen by speech pathology and apparently a barium swallow was suggestive however she has declined this. She says she's had at least 4 attempts  At  barium swallow  previously in her life and that it makes her gag and vomit and she is not willing to have one now. I do not see that bedside swallowing evaluation was done. Patient is currently on a regular diet and says that she's not sure that her swallowing has changed at all since she had the event last weekend but perhaps it's a little bit worse. She also says she's not sure how much help the dilation was she is able to swallow soft foods says she has some  difficulty still with very hard or solid food rate she denies any coughing or choking with any consistency currently. She says she doesn't have an appetite, is exhausted and short of breath and doesn't feel like eating. We were asked to see her because of persistent complaints of dysphagia.   Past Medical History  Diagnosis Date  . Benign neoplasm of colon   . Diverticulosis of colon (without mention of hemorrhage)   . Reflux esophagitis   . Gastric polyp     history of  . GERD (gastroesophageal reflux disease)   . Stricture and stenosis of esophagus   . Reactive airway disease   . Anemia, iron deficiency   . CHF (congestive heart failure)   . Restless leg syndrome   . Nonischemic cardiomyopathy     EF 20-25% 01-23-12 / BiV AICD  . Personal history of sudden cardiac death successfully resuscitated     status post cardioverter- defibrillator  . Atrial fibrillation   . Diabetes mellitus   . Osteoarthritis   . Hypertension   . UTI (lower urinary tract infection)   . v45.02   . Chronic kidney disease (CKD), stage III (moderate)   . Hyperlipemia   . GERD with stricture   . Anticoagulated on Coumadin     Past Surgical History  Procedure Date  . Cardiac defibrillator placement 1999, 2001, 3.30.2012    [facility] [employer], Removal of previously implanted BiV ICD followed by insertion of BiV pacemaker with pacemaker pocket revision. without immediate procedural complications.   Marland Kitchen  Eye surgery 2002    cataract   . Total hip arthroplasty 2004    right  . Insert / replace / remove pacemaker   . Joint replacement   . Esophagogastroduodenoscopy (egd) with esophageal dilation 01/31/2012    Procedure: ESOPHAGOGASTRODUODENOSCOPY (EGD) WITH ESOPHAGEAL DILATION;  Surgeon: Rachael Fee, MD;  Location: [facility] ENDOSCOPY;  Service: Endoscopy;  Laterality: N/A;  . Esophagogastroduodenoscopy 02/01/2012    Procedure: ESOPHAGOGASTRODUODENOSCOPY (EGD);  Surgeon: Rachael Fee, MD;  Location: [facility]  ENDOSCOPY;  Service: Endoscopy;  Laterality: N/A;    Prior to Admission medications   Medication Sig Start Date End Date Taking? Authorizing Provider  acetaminophen (TYLENOL) 325 MG tablet Take 650 mg by mouth every 6 (six) hours as needed. For pain/fever   Yes Historical Provider, MD  amiodarone (PACERONE) 200 MG tablet Take one tablet by mouth twice daily. 02/14/11  Yes Duke Salvia, MD  azelastine (OPTIVAR) 0.05 % ophthalmic solution Place 1 drop into both eyes 2 (two) times daily.   Yes Historical Provider, MD  bisacodyl (DULCOLAX) 5 MG EC tablet Take 5-10 mg by mouth daily as needed. For constipation   Yes Historical Provider, MD  desloratadine (CLARINEX) 5 MG tablet Take 5 mg by mouth daily.   Yes Historical Provider, MD  esomeprazole (NEXIUM) 40 MG capsule Take 40 mg by mouth daily before breakfast.   Yes Historical Provider, MD  fish oil-omega-3 fatty acids 1000 MG capsule Take 2 g by mouth daily.   Yes Historical Provider, MD  furosemide (LASIX) 40 MG tablet Take 40 mg by mouth daily.   Yes Historical Provider, MD  irbesartan (AVAPRO) 75 MG tablet Take 75 mg by mouth at bedtime.   Yes Historical Provider, MD  levothyroxine (SYNTHROID, LEVOTHROID) 100 MCG tablet Take 100 mcg by mouth daily.   Yes Historical Provider, MD  lipase/protease/amylase (CREON-10/PANCREASE) 12000 UNITS CPEP Take 1 capsule by mouth 3 (three) times daily before meals.   Yes Historical Provider, MD  montelukast (SINGULAIR) 10 MG tablet Take 10 mg by mouth at bedtime.   Yes Historical Provider, MD  Polyethyl Glycol-Propyl Glycol (SYSTANE ULTRA OP) Apply 3 drops to eye 3 (three) times daily.   Yes Historical Provider, MD  spironolactone (ALDACTONE) 25 MG tablet Take 25 mg by mouth daily.   Yes Historical Provider, MD  Vitamin D, Ergocalciferol, (DRISDOL) 50000 UNITS CAPS Take 50,000 Units by mouth every 7 (seven) days.   Yes Historical Provider, MD  warfarin (COUMADIN) 2 MG tablet Take 2-4 mg by mouth daily. Takes 4  mg on Thursday and 2 mg all other days   Yes Historical Provider, MD    Current Facility-Administered Medications  Medication Dose Route Frequency Provider Last Rate Last Dose  . 0.9 %  sodium chloride infusion   Intravenous Continuous Dolores Patty, MD 10 mL/hr at 02/07/12 2000    . acetaminophen (TYLENOL) tablet 650 mg  650 mg Oral Q6H PRN Jarome Matin, MD   650 mg at 02/07/12 2158   Or  . acetaminophen (TYLENOL) suppository 650 mg  650 mg Rectal Q6H PRN Jarome Matin, MD      . amiodarone (PACERONE) tablet 200 mg  200 mg Oral Daily Herby Abraham, MD   200 mg at 02/07/12 1322  . bisacodyl (DULCOLAX) suppository 10 mg  10 mg Rectal Daily PRN Jarome Matin, MD      . carvedilol (COREG) tablet 3.125 mg  3.125 mg Oral BID WC Duke Salvia, MD      .  diclofenac sodium (VOLTAREN) 1 % transdermal gel 2 g  2 g Topical QID PRN Jarome Matin, MD   2 g at 02/07/12 2021  . digoxin (LANOXIN) tablet 0.0625 mg  0.0625 mg Oral Daily Duke Salvia, MD   0.0625 mg at 02/07/12 1025  . feeding supplement (ENSURE COMPLETE) liquid 237 mL  237 mL Oral BID BM Tonye Becket, RD   237 mL at 02/06/12 1517  . furosemide (LASIX) injection 40 mg  40 mg Intravenous Once Duke Salvia, MD      . HYDROcodone-acetaminophen (NORCO/VICODIN) 5-325 MG per tablet 1-2 tablet  1-2 tablet Oral Q4H PRN Jarome Matin, MD   1 tablet at 02/04/12 2208  . levothyroxine (SYNTHROID, LEVOTHROID) tablet 100 mcg  100 mcg Oral Daily Jarome Matin, MD   100 mcg at 02/07/12 1025  . morphine 4 MG/ML injection 1 mg  1 mg Intravenous Q2H PRN Jarome Matin, MD   1 mg at 02/06/12 2105  . ondansetron (ZOFRAN) tablet 4 mg  4 mg Oral Q6H PRN Jarome Matin, MD       Or  . ondansetron [facility]) injection 4 mg  4 mg Intravenous Q6H PRN Jarome Matin, MD      . sodium chloride 0.9 % bolus 250 mL  250 mL Intravenous Once Dolores Patty, MD      . sodium chloride 0.9 % injection 10-40 mL  10-40 mL Intracatheter Q12H Jarome Matin, MD   10 mL at 02/07/12 2021  . sodium chloride 0.9 % injection 10-40 mL  10-40 mL Intracatheter PRN Jarome Matin, MD      . warfarin (COUMADIN) tablet 2 mg  2 mg Oral ONCE-1800 Jarome Matin, MD      . [address] warfarin (COUMADIN) tablet 2.5 mg  2.5 mg Oral ONCE-1800 Ann Held, PHARMD   2.5 mg at 02/07/12 1734  . Warfarin - Pharmacist Dosing Inpatient   Does not apply q1800 Ann Held, PHARMD      . zolpidem [facility]) tablet 5 mg  5 mg Oral QHS PRN Jarome Matin, MD      . [DISCONTINUED] amiodarone (NEXTERONE PREMIX) 360 mg/200 mL dextrose IV infusion  0.5 mg/min Intravenous Continuous Dolores Patty, MD 16.7 mL/hr at 02/07/12 0332 0.5 mg/min at 02/07/12 0332  . [DISCONTINUED] amiodarone (NEXTERONE) 1.8 mg/mL load via infusion 150 mg  150 mg Intravenous Once Dolores Patty, MD      . [DISCONTINUED] cefTRIAXone (ROCEPHIN) 1 g in dextrose 5 % 50 mL IVPB  1 g Intravenous Q24H Jarome Matin, MD   1 g at 02/06/12 1517  . [DISCONTINUED] heparin ADULT infusion 100 units/mL (25000 units/250 mL)  1,150 Units/hr Intravenous Continuous Colleen Can, PHARMD 11.5 mL/hr at 02/07/12 2000 1,150 Units/hr at 02/07/12 2000    Allergies as of 01/29/2012 - Review Complete 10/14/2011  Allergen Reaction Noted  . Iodinated diagnostic agents  05/31/2010    History reviewed. No pertinent family history.  History   Social History  . Marital Status: Widowed    Spouse Name: N/A    Number of Children: N/A  . Years of Education: N/A   Occupational History  . Not on file.   Social History Main Topics  . Smoking status: Never Smoker   . Smokeless tobacco: Never Used  . Alcohol Use: 3.6 oz/week    6 Shots of liquor per week     Comment: OCCASSIONAL  . Drug Use: No  . Sexually Active:    Other  Topics Concern  . Not on file   Social History Narrative  . No narrative on file    Review of Systems: Pertinent positive and negative review of systems were  noted in the above HPI section.  All other review of systems was otherwise negative.  Physical Exam: Vital signs in last 24 hours: Temp:  [97.3 F (36.3 C)-97.9 F (36.6 C)] 97.7 F (36.5 C) (11/28 0715) Pulse Rate:  [77-117] 77  (11/28 0715) Resp:  [15-17] 15  (11/28 0715) BP: (107-129)/(49-88) 121/67 mmHg (11/28 0715) SpO2:  [97 %-100 %] 98 % (11/28 0715) Weight:  [167 lb 8.8 oz (76 kg)] 167 lb 8.8 oz (76 kg) (11/28 0327) Last BM Date: 01/30/12 General:   Alert,  Well-developed, well-nourished, chronically ill-appearing elderly white female, sitting up in chair dyspneic at rest  Eyes:  Sclera clear, no icterus.   Conjunctiva pink. Ears:  Normal auditory acuity. Nose:  No deformity, discharge,  or lesions. Mouth:  No deformity or lesions.   Neck:  Supple; no masses or thyromegaly. Lungs:   Bibasilar rales  Heart: Irregular rate and rhythm; systolic murmur AICD in chest wall Abdomen:  Soft,nontender, BS active,nonpalp mass or hsm.   Rectal:  Deferred  Msk:  Symmetrical without gross deformities. . Pulses:  Normal pulses noted. Extremities:  Trace  edema. Neurologic:  Alert and  oriented x4;  grossly normal neurologically. Speech a little slow but very appropriate with no slurring or expressive aphasia Skin:  Intact without significant lesions or rashes.. Psych:  Alert and cooperative. Normal mood and affect.  Intake/Output from previous day: 11/27 0701 - 11/28 0700 In: 1166.2 [P.O.:600; I.V.:566.2] Out: 351 [Urine:350; Stool:1] Intake/Output this shift:    Lab Results:  Basename 02/08/12 0359 02/07/12 0500 02/06/12 0445  WBC 10.0 10.6* 13.0*  HGB 10.9* 10.8* 11.6*  HCT 30.5* 31.0* 34.2*  PLT 241 268 270   BMET  Basename 02/08/12 0359 02/07/12 0500 02/06/12 0445  NA 133* 134* 134*  K 4.0 3.8 4.0  CL 101 100 101  CO2 22 23 23   GLUCOSE 98 92 105*  BUN 27* 28* 31*  CREATININE 1.34* 1.13* 1.23*  CALCIUM 9.0 8.8 8.7   LFT No results found for this basename:  PROT,ALBUMIN,AST,ALT,ALKPHOS,BILITOT,BILIDIR,IBILI in the last 72 hours PT/INR  Basename 02/08/12 0359 02/07/12 0500  LABPROT 27.3* 22.9*  INR 2.69* 2.13*     Studies/Results: Dg Chest Port 1 View  02/07/2012  *RADIOLOGY REPORT*  Clinical Data: 78 year old female with shortness of breath and CHF.  PORTABLE CHEST - 1 VIEW  Comparison: 02/03/2012 chest radiograph  Findings: Right lung airspace opacities are unchanged. Cardiomegaly and left-sided AICD / pacemaker again noted. A right PICC line is again identified with tip overlying cavoatrial junction. No definite pleural effusion or pneumothorax noted.  IMPRESSION: Stable chest radiograph with right lung airspace disease which may represent asymmetric edema or pneumonia.   Original Report Authenticated By: Harmon Pier, M.D.     IMPRESSION:  78 year old female with multiple serious medical problems,in congestive heart failure and symptomatic. Patient also with nonischemic cardiomyopathy EF of 20% #2 chronic anticoagulation with Coumadin INR currently 2.69 #3 atrial flutter #4 dysphagia-suspect this is multifactorial. She had successful EGD with dilation of a distal esophageal stricture on 02/01/2012, and does not have any evidence of complication post dilation. Unfortunately she sustained a small CVA versus TIA on 02/03/2012 while off of anticoagulation and was manifested with speech impairment and expressive aphasia which has for the most part resolved.  Patient may have a component of neurogenic dysphagia at this time but is able to salt swallow soft foods without any difficulty #5 multiple other medical issues as outlined above  PLAN: Patient has declined barium swallow and would not push her to do this at this time. Do not feel that she needs another endoscopy, and if in fact she still had a component of stricture she would have to come back off of anticoagulation in order to deal with that. Would get speech path reinvolved for bedside  swallowing evaluation and for now allow her full liquid to soft diet as tolerated.    Ailish Prospero  02/08/2012, 10:12 AM

## 2012-02-08 NOTE — Progress Notes (Signed)
Patient Name: [name]      SUBJECTIVE:   Persistent atrial flutter with much improved rate control Still with difficulty swallowing and with some sob Able to walk a little bit yesterday--very weak   Past Medical History  Diagnosis Date  . Benign neoplasm of colon   . Diverticulosis of colon (without mention of hemorrhage)   . Reflux esophagitis   . Gastric polyp     history of  . GERD (gastroesophageal reflux disease)   . Stricture and stenosis of esophagus   . Reactive airway disease   . Anemia, iron deficiency   . CHF (congestive heart failure)   . Restless leg syndrome   . Nonischemic cardiomyopathy     EF 20-25% 01-23-12 / BiV AICD  . Personal history of sudden cardiac death successfully resuscitated     status post cardioverter- defibrillator  . Atrial fibrillation   . Diabetes mellitus   . Osteoarthritis   . Hypertension   . UTI (lower urinary tract infection)   . v45.02   . Chronic kidney disease (CKD), stage III (moderate)   . Hyperlipemia   . GERD with stricture   . Anticoagulated on Coumadin     PHYSICAL EXAM Filed Vitals:   02/07/12 1942 02/07/12 2328 02/08/12 0327 02/08/12 0715  BP: 112/67 107/55 110/60 121/67  Pulse: 93 79  77  Temp: 97.4 F (36.3 C) 97.8 F (36.6 C) 97.7 F (36.5 C) 97.7 F (36.5 C)  TempSrc: Oral Oral Oral Axillary  Resp: 16 15 17 15   Height:      Weight:   167 lb 8.8 oz (76 kg)   SpO2: 99% 99% 97% 98%    Well developed and nourished in no acute distress HENT normal Neck supple  JVP 8-10 Clear Irregularly irregular rate and rhythm with controlled  ventricular response, no murmurs or gallops Abd-soft with active BS with  Hepatomegaly and RUQ tenderness No Clubbing cyanosis edema Skin-warm and dry A & Oriented  Grossly normal sensory and motor function  TELEMETRY: Reviewed telemetry pt in :afb    Intake/Output Summary (Last 24 hours) at 02/08/12 0818 Last data filed at 02/08/12 0700  Gross per 24 hour    Intake    888 ml  Output    351 ml  Net    537 ml    LABS: Basic Metabolic Panel:  Lab 02/08/12 1610 02/07/12 0500 02/06/12 0445 02/05/12 0430 02/04/12 1800 02/04/12 0400 02/03/12 0515 02/02/12 0555  NA 133* 134* 134* 132* 133* 134* 137 --  K 4.0 3.8 4.0 3.5 4.3 2.9* 3.2* --  CL 101 100 101 99 98 98 98 --  CO2 22 23 23 24 24 25 26  --  GLUCOSE 98 92 105* 112* 118* 105* 120* --  BUN 27* 28* 31* 36* 38* 41* 45* --  CREATININE 1.34* 1.13* 1.23* 1.28* 1.38* 1.52* 2.10* --  CALCIUM 9.0 8.8 -- -- -- -- -- --  MG -- -- -- -- -- -- -- 1.7  PHOS -- -- -- -- -- -- -- --   Cardiac Enzymes: No results found for this basename: CKTOTAL:3,CKMB:3,CKMBINDEX:3,TROPONINI:3 in the last 72 hours CBC:  Lab 02/08/12 0359 02/07/12 0500 02/06/12 0445 02/05/12 0430 02/04/12 0400 02/02/12 0555  WBC 10.0 10.6* 13.0* 11.4* 14.5* 13.0*  NEUTROABS -- -- -- -- -- --  HGB 10.9* 10.8* 11.6* 11.4* 12.1 11.6*  HCT 30.5* 31.0* 34.2* 33.3* 35.3* 34.6*  MCV 95.0 96.6 95.3 98.5 98.9 100.3*  PLT 241 268 270  218 213 214   PROTIME:  Basename 02/08/12 0359 02/07/12 0500 02/06/12 0445  LABPROT 27.3* 22.9* 20.2*  INR 2.69* 2.13* 1.79*   Liver Function Tests: No results found for this basename: AST:2,ALT:2,ALKPHOS:2,BILITOT:2,PROT:2,ALBUMIN:2 in the last 72 hours No results found for this basename: LIPASE:2,AMYLASE:2 in the last 72 hours BNP: BNP (last 3 results)  Basename 01/31/12 1555 01/29/12 1849  PROBNP 25282.0* 15984.0*     ASSESSMENT AND PLAN:  Patient Active Hospital Problem List: CHF (congestive heart failure), NYHA class IV (01/31/2012)  HYPERTENSION (04/30/2007)  CARDIOMYOPATHY, PRIMARY (05/26/2008)  Atrial fibrillation (05/26/2008) * Hypotension (02/03/2012)  Acute on chronic renal failure (02/03/2012) MUCH IMPROVED  Transient ischemic attack (02/04/2012)  Still on  Amio for rate control; much improved Will add low dose betablocker with improved BP Resume lasix low dose, follow renal  function Stop heparin now >24h overlap  let s anticipate d/c not before mon GI still to see  Signed, Sherryl Manges MD  02/08/2012

## 2012-02-08 NOTE — Consult Note (Signed)
Patient seen, examined, and I agree with the above documentation, including the assessment and plan. Pt's esophagus dilated to 20 mm recently. I do not think her current issues relate to this stricture.  Agree with speech and swallow eval.

## 2012-02-08 NOTE — Progress Notes (Signed)
Subjective: Patient seen and evaluated by both gastroenterology as well as cardiology within the last 24 hours. Swallowing issues are not thought to be related to recent dilated esophagus and stricture. Speech and swallow evaluation recommended and pending. Cardiology is also seen the patient and noted her stage IV or class IV congestive heart failure with cardiomyopathy, atrial fibrillation, chronic kidney disease. Rate controlled on amiodarone, low-dose beta blocker also added along with resumption of Lasix.   Objective: Vital signs in last 24 hours: Temp:  [97.3 F (36.3 C)-97.8 F (36.6 C)] 97.7 F (36.5 C) (11/28 0715) Pulse Rate:  [77-93] 77  (11/28 0715) Resp:  [15-17] 15  (11/28 0715) BP: (107-125)/(49-67) 121/67 mmHg (11/28 0715) SpO2:  [97 %-100 %] 98 % (11/28 0715) Weight:  [76 kg (167 lb 8.8 oz)] 76 kg (167 lb 8.8 oz) (11/28 0327) Weight change: 0.7 kg (1 lb 8.7 oz) Last BM Date: 01/30/12  CBG (last 3)   Basename 02/08/12 0721 02/07/12 0815 02/06/12 1147  GLUCAP 85 87 105*    Intake/Output from previous day: 11/27 0701 - 11/28 0700 In: 1166.2 [P.O.:600; I.V.:566.2] Out: 351 [Urine:350; Stool:1] Intake/Output this shift: Total I/O In: 96 [I.V.:96] Out: 301 [Urine:300; Stool:1]  General appearance-no apparent distress lying flat in bed, answering questions appropriately, subjectively with some general malaise Sclera non anicteric Lungs clear to auscultation bilaterally Cardiovascular reveals an irregularly irregular rhythm Abdomen soft, nontender, nondistended bowel sounds present Extremities reveal no edema, cyanosis, pedal pulses intact.    Lab Results:  Basename 02/08/12 0359 02/07/12 0500  NA 133* 134*  K 4.0 3.8  CL 101 100  CO2 22 23  GLUCOSE 98 92  BUN 27* 28*  CREATININE 1.34* 1.13*  CALCIUM 9.0 8.8  MG -- --  PHOS -- --   No results found for this basename: AST:2,ALT:2,ALKPHOS:2,BILITOT:2,PROT:2,ALBUMIN:2 in the last 72 hours  Basename  02/08/12 0359 02/07/12 0500  WBC 10.0 10.6*  NEUTROABS -- --  HGB 10.9* 10.8*  HCT 30.5* 31.0*  MCV 95.0 96.6  PLT 241 268   No results found for this basename: CKTOTAL:3,CKMB:3,CKMBINDEX:3,TROPONINI:3 in the last 72 hours No results found for this basename: TSH,T4TOTAL,FREET3,T3FREE,THYROIDAB in the last 72 hours No results found for this basename: VITAMINB12:2,FOLATE:2,FERRITIN:2,TIBC:2,IRON:2,RETICCTPCT:2 in the last 72 hours  Studies/Results: Dg Chest Port 1 View  02/07/2012  *RADIOLOGY REPORT*  Clinical Data: 78 year old female with shortness of breath and CHF.  PORTABLE CHEST - 1 VIEW  Comparison: 02/03/2012 chest radiograph  Findings: Right lung airspace opacities are unchanged. Cardiomegaly and left-sided AICD / pacemaker again noted. A right PICC line is again identified with tip overlying cavoatrial junction. No definite pleural effusion or pneumothorax noted.  IMPRESSION: Stable chest radiograph with right lung airspace disease which may represent asymmetric edema or pneumonia.   Original Report Authenticated By: Harmon Pier, M.D.      Medications: Scheduled:   . [COMPLETED] alteplase  2 mg Intracatheter Once  . amiodarone  200 mg Oral Daily  . carvedilol  3.125 mg Oral BID WC  . digoxin  0.0625 mg Oral Daily  . feeding supplement  237 mL Oral BID BM  . [COMPLETED] furosemide  40 mg Intravenous Once  . levothyroxine  100 mcg Oral Daily  . sodium chloride  250 mL Intravenous Once  . sodium chloride  10-40 mL Intracatheter Q12H  . warfarin  2 mg Oral ONCE-1800  . [COMPLETED] warfarin  2.5 mg Oral ONCE-1800  . Warfarin - Pharmacist Dosing Inpatient   Does not  apply q1800  . white petrolatum       Continuous:   . sodium chloride 10 mL/hr at 02/07/12 2000  . [DISCONTINUED] heparin 1,150 Units/hr (02/07/12 2000)    Assessment/Plan: Principal Problem:  *CHF (congestive heart failure), NYHA class IV-no evidence of significant volume overload, Lasix reinitiated by  cardiology, urine output ongoing, no respiratory distress Active Problems:  ANEMIA, IRON DEFICIENCY-hemoglobin relatively stable at 10.9  HYPERTENSION-blood pressure stable despite initiation of Lasix on the beta blocker to be added by cardiology in conjunction with amiodarone to  CARDIOMYOPATHY, PRIMARY  Atrial fibrillation-rate controlled with initiation of beta blocker, anticoagulation continue  ESOPHAGEAL STRICTURE STATUS post dilatation, gastroenterology note reviewed  Dysphagia speech and swallow evaluation recommended by gastroenterology-order still active  Acute on chronic renal failure-renal parameters are relatively stable, slight bump in creatinine, we'll see what happens in followup in the morning with initiation of Lasix. Disposition-per Dr. Jarold Motto, question skilled nursing facility on Friday versus early next week, improving,  transfer out to step down, transfer to telemetry per Dr. Alberteen Spindle and Dr. Jarold Motto    LOS: 10 days   Albaro Deviney R 02/08/2012, 2:14 PM

## 2012-02-08 NOTE — Progress Notes (Signed)
ANTICOAGULATION CONSULT NOTE  Pharmacy Consult for Heparin + Warfarin Indication: Hx Afib and possible new CVA this admit  Allergies  Allergen Reactions  . Iodinated Diagnostic Agents     Unknown    Patient Measurements: Height: 5\' 2"  (157.5 cm) Weight: 167 lb 8.8 oz (76 kg) IBW/kg (Calculated) : 50.1   Vital Signs: Temp: 97.7 F (36.5 C) (11/28 0715) Temp src: Axillary (11/28 0715) BP: 121/67 mmHg (11/28 0715) Pulse Rate: 77  (11/28 0715)  Labs:  Basename 02/08/12 0359 02/07/12 0500 02/06/12 0445  HGB 10.9* 10.8* --  HCT 30.5* 31.0* 34.2*  PLT 241 268 270  APTT -- -- --  LABPROT 27.3* 22.9* 20.2*  INR 2.69* 2.13* 1.79*  HEPARINUNFRC 0.44 0.47 0.41  CREATININE 1.34* 1.13* 1.23*  CKTOTAL -- -- --  CKMB -- -- --  TROPONINI -- -- --    Estimated Creatinine Clearance: 30.4 ml/min (by C-G formula based on Cr of 1.34).   Assessment: 78 y.o. F on warfarin PTA (2mg  daily except 4mg  on Thursday) for hx Afib and admitted with elevated INR of 5.23 likely related to poor po intake/dysphagia PTA in the setting of hx GERD and esophageal stricture. Warfarin initially held and reversed with Vit K for GI procedures -- then resumed with heparin bridge on 11/22. The patient was noted to have slurred speech and expressive aphasia on 11/23 while off of warfarin -- likely TIA vs small CVA.  Heparin level and INR this morning is therapeutic (HL 0.44, goal of 0.3-0.5 and INR 2.69, goal of 2-3). Hgb/Hct stable -- no s/sx of bleeding noted.  Heparin drip can be discontinue at this point per MD's discretion. Goal of Therapy:  Heparin level 0.3-0.5 INR 2-3 Monitor platelets by anticoagulation protocol: Yes   Plan:  1. Continue heparin at current rate of 1150 units/hr 2. Warfarin 2 mg x 1 dose at 1800 today 3. Will continue to monitor for any signs/symptoms of bleeding and will follow up with PT/INR in the a.m.   Celedonio Miyamoto, PharmD, BCPS Clinical Pharmacist Pager [phone]  02/08/2012  8:40 AM

## 2012-02-09 LAB — CBC
Hemoglobin: 10.2 g/dL — ABNORMAL LOW (ref 12.0–15.0)
MCH: 33.8 pg (ref 26.0–34.0)
RBC: 3.02 MIL/uL — ABNORMAL LOW (ref 3.87–5.11)

## 2012-02-09 LAB — CARBOXYHEMOGLOBIN: Carboxyhemoglobin: 1 % (ref 0.5–1.5)

## 2012-02-09 LAB — BASIC METABOLIC PANEL
CO2: 24 mEq/L (ref 19–32)
Calcium: 8.7 mg/dL (ref 8.4–10.5)
Glucose, Bld: 90 mg/dL (ref 70–99)
Potassium: 4 mEq/L (ref 3.5–5.1)
Sodium: 134 mEq/L — ABNORMAL LOW (ref 135–145)

## 2012-02-09 LAB — PROTIME-INR: Prothrombin Time: 31.1 seconds — ABNORMAL HIGH (ref 11.6–15.2)

## 2012-02-09 MED ORDER — FUROSEMIDE 40 MG PO TABS
40.0000 mg | ORAL_TABLET | Freq: Every day | ORAL | Status: DC
  Administered 2012-02-09: 40 mg via ORAL
  Filled 2012-02-09 (×2): qty 1

## 2012-02-09 NOTE — Progress Notes (Addendum)
     [facility] Daily Rounding Note 02/09/2012, 9:58 AM  SUBJECTIVE:       Feels weak and fatigues easily  C/o SOB.  Aphasia and right limb weakness are much improved, nearly resolved  OBJECTIVE:         Vital signs in last 24 hours:    Temp:  [97.4 F (36.3 C)-97.8 F (36.6 C)] 97.8 F (36.6 C) (11/29 0758) Pulse Rate:  [72-78] 78  (11/29 0900) Resp:  [18-20] 20  (11/29 0445) BP: (100-127)/(54-66) 121/66 mmHg (11/29 0800) SpO2:  [96 %-98 %] 97 % (11/29 0800) Weight:  [72.1 kg (158 lb 15.2 oz)] 72.1 kg (158 lb 15.2 oz) (11/29 0500) Last BM Date: 02/08/12 General: pleasant, alert, verbal.   Heart: paced rhythmwith underlying A fib.  Chest: crackles in bases.  Mild dyspnea with speach Abdomen: soft, NT, ND.  Active BS  Extremities: no pedal edema Neuro/Psych:  Oriented x3.  Appropriate, cooperative.  Grip and UE strength on right is not weak.   Intake/Output from previous day: 11/28 0701 - 11/29 0700 In: 576 [P.O.:480; I.V.:96] Out: 701 [Urine:700; Stool:1]  Intake/Output this shift: Total I/O In: 360 [P.O.:360] Out: -   Lab Results:  Basename 02/09/12 0500 02/08/12 0359 02/07/12 0500  WBC 7.5 10.0 10.6*  HGB 10.2* 10.9* 10.8*  HCT 29.7* 30.5* 31.0*  PLT 210 241 268   BMET  Basename 02/09/12 0500 02/08/12 0359 02/07/12 0500  NA 134* 133* 134*  K 4.0 4.0 3.8  CL 102 101 100  CO2 24 22 23   GLUCOSE 90 98 92  BUN 28* 27* 28*  CREATININE 1.46* 1.34* 1.13*  CALCIUM 8.7 9.0 8.8   PT/INR  Basename 02/09/12 0500 02/08/12 0359  LABPROT 31.1* 27.3*  INR 3.21* 2.69*    Studies/Results: Dg Chest Port 1 View 02/07/2012  IMPRESSION: Stable chest radiograph with right lung airspace disease which may represent asymmetric edema or pneumonia.   Original Report Authenticated By: Harmon Pier, M.D.     ASSESMENT: *  Dysphagia.  11/21 dilation of esophageal stricture off Coumadin. Tortuous distal esophagus noted.  Not clear the dilation provided much benefit.  Suspect  dysmotility at play.  *  CHF.  Atrial fib and flutter.  Chronic Coumadin. INR once again therapeutic.  *  Acute CVA vs TIA sxs 11/23 while getting reanticoagulated with Coumadin. Unable to confirm CVA due to pacemaker in place. Her aphasia and right side weakness are resolved      PLAN: *  Not clear we need to involve SLP for swallow evaluation since she is tolerating D 3 diet.  No esophagram as she has not tolerated these in past.     LOS: 11 days   Jennye Moccasin  02/09/2012, 9:58 AM Pager: [phone]

## 2012-02-09 NOTE — Progress Notes (Signed)
ANTICOAGULATION CONSULT NOTE  Pharmacy Consult for Warfarin Indication: Hx Afib and possible new CVA this admit  Allergies  Allergen Reactions  . Iodinated Diagnostic Agents     Unknown    Patient Measurements: Height: 5\' 2"  (157.5 cm) Weight: 158 lb 15.2 oz (72.1 kg) IBW/kg (Calculated) : 50.1   Vital Signs: Temp: 97.8 F (36.6 C) (11/29 0758) Temp src: Oral (11/29 0758) BP: 121/66 mmHg (11/29 0758) Pulse Rate: 72  (11/29 0758)  Labs:  Basename 02/09/12 0500 02/08/12 0359 02/07/12 0500  HGB 10.2* 10.9* --  HCT 29.7* 30.5* 31.0*  PLT 210 241 268  APTT -- -- --  LABPROT 31.1* 27.3* 22.9*  INR 3.21* 2.69* 2.13*  HEPARINUNFRC -- 0.44 0.47  CREATININE 1.46* 1.34* 1.13*  CKTOTAL -- -- --  CKMB -- -- --  TROPONINI -- -- --    Estimated Creatinine Clearance: 27.1 ml/min (by C-G formula based on Cr of 1.46).   Assessment: 78 y.o. F on warfarin PTA (2mg  daily except 4mg  on Thursday) for hx Afib and admitted with elevated INR of 5.23 likely related to poor po intake/dysphagia PTA in the setting of hx GERD and esophageal stricture. Warfarin initially held and reversed with Vit K for GI procedures -- then resumed with heparin bridge on 11/22. The patient was noted to have slurred speech and expressive aphasia on 11/23 while off of warfarin -- likely TIA vs small CVA.  Heparin drip has been stopped and INR this morning is above goal( INR 3.21, goal of 2-3). Hgb/Hct stable -- no s/sx of bleeding noted.   Goal of Therapy:   INR 2-3 Monitor platelets by anticoagulation protocol: Yes   Plan:  - No Warfarin today - Will continue to monitor for any signs/symptoms of bleeding and will follow up with PT/INR in the a.m.   Celedonio Miyamoto, PharmD, BCPS Clinical Pharmacist Pager [phone]  02/09/2012 8:44 AM

## 2012-02-09 NOTE — Progress Notes (Signed)
Patient seen, examined, and I agree with the above documentation, including the assessment and plan. Somewhat improved dysphagia.  Again, I doubt that this is stricture related, and agree that dysmotility is likely contributing If issues continue would consider speech and swallow evaluation Please call with questions

## 2012-02-09 NOTE — Progress Notes (Signed)
UR Completed.  De Libman Jane [phone] 02/09/2012  

## 2012-02-09 NOTE — Progress Notes (Signed)
Subjective: Continues to feel very weak with any activity. She is swallowing soft foods without difficulty. No increased dyspnea with starting coreg.  Objective: Vital signs in last 24 hours: Temp:  [97.4 F (36.3 C)-97.8 F (36.6 C)] 97.8 F (36.6 C) (11/29 0758) Pulse Rate:  [72] 72  (11/29 0758) Resp:  [18-20] 20  (11/29 0445) BP: (100-127)/(54-66) 121/66 mmHg (11/29 0758) SpO2:  [96 %-98 %] 97 % (11/29 0758) Weight:  [72.1 kg (158 lb 15.2 oz)] 72.1 kg (158 lb 15.2 oz) (11/29 0500) Weight change: -3.9 kg (-8 lb 9.6 oz)   Intake/Output from previous day: 11/28 0701 - 11/29 0700 In: 576 [P.O.:480; I.V.:96] Out: 701 [Urine:700; Stool:1]   General appearance: alert, cooperative and no distress Resp: clear to auscultation bilaterally Cardio: irregularly irregular rhythm Extremities: bilateral 1+ leg edema  Lab Results:  Basename 02/09/12 0500 02/08/12 0359  WBC 7.5 10.0  HGB 10.2* 10.9*  HCT 29.7* 30.5*  PLT 210 241   BMET  Basename 02/09/12 0500 02/08/12 0359  NA 134* 133*  K 4.0 4.0  CL 102 101  CO2 24 22  GLUCOSE 90 98  BUN 28* 27*  CREATININE 1.46* 1.34*  CALCIUM 8.7 9.0   CMET CMP     Component Value Date/Time   NA 134* 02/09/2012 0500   K 4.0 02/09/2012 0500   CL 102 02/09/2012 0500   CO2 24 02/09/2012 0500   GLUCOSE 90 02/09/2012 0500   BUN 28* 02/09/2012 0500   CREATININE 1.46* 02/09/2012 0500   CALCIUM 8.7 02/09/2012 0500   PROT 6.1 01/29/2012 1849   ALBUMIN 2.7* 01/29/2012 1849   AST 25 01/29/2012 1849   ALT 35 01/29/2012 1849   ALKPHOS 175* 01/29/2012 1849   BILITOT 0.9 01/29/2012 1849   GFRNONAA 32* 02/09/2012 0500   GFRAA 37* 02/09/2012 0500    CBG (last 3)   Basename 02/09/12 0802 02/08/12 0721 02/07/12 0815  GLUCAP 94 85 87    INR RESULTS:   Lab Results  Component Value Date   INR 3.21* 02/09/2012   INR 2.69* 02/08/2012   INR 2.13* 02/07/2012     Studies/Results: Dg Chest Port 1 View  02/07/2012  *RADIOLOGY REPORT*   Clinical Data: 78 year old female with shortness of breath and CHF.  PORTABLE CHEST - 1 VIEW  Comparison: 02/03/2012 chest radiograph  Findings: Right lung airspace opacities are unchanged. Cardiomegaly and left-sided AICD / pacemaker again noted. A right PICC line is again identified with tip overlying cavoatrial junction. No definite pleural effusion or pneumothorax noted.  IMPRESSION: Stable chest radiograph with right lung airspace disease which may represent asymmetric edema or pneumonia.   Original Report Authenticated By: Harmon Pier, M.D.     Medications: I have reviewed the patient's current medications.  Assessment/Plan: #1 dyspnea: has class 3/4 sxs, doing reasonably well on current meds. Per Dr. Graciela Husbands will postpone planned discharge to SNF today until Monday. Will see if [facility] would like to assume attending status.  #2 dysphagia: mild and likely due to dysmotility. Will continue current diet.  #3 DM2:  Stable on current meds.   LOS: 11 days   Setsuko Robins G 02/09/2012, 9:20 AM

## 2012-02-09 NOTE — Progress Notes (Signed)
Patient Name: [name]      SUBJECTIVE:   Persistent atrial flutter with much improved rate control Swallowing and breathing better, sitting up but not ambulation   Past Medical History  Diagnosis Date  . Benign neoplasm of colon   . Diverticulosis of colon (without mention of hemorrhage)   . Reflux esophagitis   . Gastric polyp     history of  . GERD (gastroesophageal reflux disease)   . Stricture and stenosis of esophagus   . Reactive airway disease   . Anemia, iron deficiency   . CHF (congestive heart failure)   . Restless leg syndrome   . Nonischemic cardiomyopathy     EF 20-25% 01-23-12 / BiV AICD  . Personal history of sudden cardiac death successfully resuscitated     status post cardioverter- defibrillator  . Atrial fibrillation   . Diabetes mellitus   . Osteoarthritis   . Hypertension   . UTI (lower urinary tract infection)   . v45.02   . Chronic kidney disease (CKD), stage III (moderate)   . Hyperlipemia   . GERD with stricture   . Anticoagulated on Coumadin     PHYSICAL EXAM Filed Vitals:   02/09/12 0500 02/09/12 0758 02/09/12 0800 02/09/12 0900  BP:  121/66 121/66   Pulse:  72  78  Temp:  97.8 F (36.6 C)    TempSrc:  Oral    Resp:      Height:      Weight: 158 lb 15.2 oz (72.1 kg)     SpO2:  97% 97%     Well developed and nourished in no acute distress HENT normal Neck supple  JVP <8-10 Clear Irregularly irregular rate and rhythm with controlled  ventricular response, no murmurs or gallops Abd-soft with active BS with  Hepatomegaly and RUQ tenderness No Clubbing cyanosis edema Skin-warm and dry A & Oriented  Grossly normal sensory and motor function  TELEMETRY: Reviewed telemetry pt in :afb    Intake/Output Summary (Last 24 hours) at 02/09/12 1043 Last data filed at 02/09/12 0915  Gross per 24 hour  Intake  751.5 ml  Output    701 ml  Net   50.5 ml    LABS: Basic Metabolic Panel:  Lab 02/09/12 4098 02/08/12 0359 02/07/12  0500 02/06/12 0445 02/05/12 0430 02/04/12 1800 02/04/12 0400  NA 134* 133* 134* 134* 132* 133* 134*  K 4.0 4.0 3.8 4.0 3.5 4.3 2.9*  CL 102 101 100 101 99 98 98  CO2 24 22 23 23 24 24 25   GLUCOSE 90 98 92 105* 112* 118* 105*  BUN 28* 27* 28* 31* 36* 38* 41*  CREATININE 1.46* 1.34* 1.13* 1.23* 1.28* 1.38* 1.52*  CALCIUM 8.7 9.0 -- -- -- -- --  MG -- -- -- -- -- -- --  PHOS -- -- -- -- -- -- --   Cardiac Enzymes: No results found for this basename: CKTOTAL:3,CKMB:3,CKMBINDEX:3,TROPONINI:3 in the last 72 hours CBC:  Lab 02/09/12 0500 02/08/12 0359 02/07/12 0500 02/06/12 0445 02/05/12 0430 02/04/12 0400  WBC 7.5 10.0 10.6* 13.0* 11.4* 14.5*  NEUTROABS -- -- -- -- -- --  HGB 10.2* 10.9* 10.8* 11.6* 11.4* 12.1  HCT 29.7* 30.5* 31.0* 34.2* 33.3* 35.3*  MCV 98.3 95.0 96.6 95.3 98.5 98.9  PLT 210 241 268 270 218 213   PROTIME:  Basename 02/09/12 0500 02/08/12 0359 02/07/12 0500  LABPROT 31.1* 27.3* 22.9*  INR 3.21* 2.69* 2.13*     ASSESSMENT AND PLAN:  Patient Active Hospital Problem List: CHF (congestive heart failure), NYHA class IV (01/31/2012)  HYPERTENSION (04/30/2007)  CARDIOMYOPATHY, PRIMARY (05/26/2008)  Atrial fibrillation (05/26/2008) * Hypotension (02/03/2012)  Acute on chronic renal failure (02/03/2012) MUCH IMPROVED  Transient ischemic attack (02/04/2012)  Much improved today with less sob and improved swallowing, ??was this being bothered by her dyspnea Still on  Amio for rate control; much improved continue low dose betablocker with improved BP continue lasix low dose, follow renal function.  If bp and renal function permit, plz start low dose ace over weekend      Signed, Sherryl Manges MD  02/09/2012

## 2012-02-10 DIAGNOSIS — Z950 Presence of cardiac pacemaker: Secondary | ICD-10-CM

## 2012-02-10 DIAGNOSIS — Z5181 Encounter for therapeutic drug level monitoring: Secondary | ICD-10-CM

## 2012-02-10 DIAGNOSIS — I4892 Unspecified atrial flutter: Secondary | ICD-10-CM

## 2012-02-10 DIAGNOSIS — Z7901 Long term (current) use of anticoagulants: Secondary | ICD-10-CM

## 2012-02-10 LAB — BASIC METABOLIC PANEL
Chloride: 101 mEq/L (ref 96–112)
GFR calc Af Amer: 35 mL/min — ABNORMAL LOW (ref >90)
Potassium: 4 mEq/L (ref 3.5–5.1)
Sodium: 136 mEq/L (ref 135–145)

## 2012-02-10 LAB — PROTIME-INR
INR: 3.34 — ABNORMAL HIGH (ref 0.00–1.49)
Prothrombin Time: 32 seconds — ABNORMAL HIGH (ref 11.6–15.2)

## 2012-02-10 LAB — GLUCOSE, CAPILLARY: Glucose-Capillary: 92 mg/dL (ref 70–99)

## 2012-02-10 MED ORDER — FUROSEMIDE 40 MG PO TABS
40.0000 mg | ORAL_TABLET | Freq: Every day | ORAL | Status: DC
  Filled 2012-02-10: qty 1

## 2012-02-10 NOTE — Progress Notes (Signed)
ANTICOAGULATION CONSULT NOTE  Pharmacy Consult for Warfarin Indication: Hx Afib and possible new CVA this admit  Allergies  Allergen Reactions  . Iodinated Diagnostic Agents     Unknown   Labs:  Basename 02/10/12 0500 02/09/12 0500 02/08/12 0359  HGB -- 10.2* 10.9*  HCT -- 29.7* 30.5*  PLT -- 210 241  APTT -- -- --  LABPROT 32.0* 31.1* 27.3*  INR 3.34* 3.21* 2.69*  HEPARINUNFRC -- -- 0.44  CREATININE 1.52* 1.46* 1.34*  CKTOTAL -- -- --  CKMB -- -- --  TROPONINI -- -- --    Estimated Creatinine Clearance: 25.8 ml/min (by C-G formula based on Cr of 1.52).   Assessment: 78 y.o. F on warfarin PTA (2mg  daily except 4mg  on Thursday) for hx Afib and admitted with elevated INR of 5.23 likely related to poor po intake/dysphagia PTA in the setting of hx GERD and esophageal stricture. Warfarin initially held and reversed with Vit K for GI procedures -- then resumed with heparin bridge on 11/22. The patient was noted to have slurred speech and expressive aphasia on 11/23 while off of warfarin -- likely TIA vs small CVA.  INR this AM = 3.34  Goal of Therapy:   INR 2-3 Monitor platelets by anticoagulation protocol: Yes   Plan:  - No Warfarin today - Will continue to monitor for any signs/symptoms of bleeding and will follow up with PT/INR in the a.m.   Thank you. Okey Regal, PharmD [phone]  02/10/2012 8:19 AM

## 2012-02-10 NOTE — Progress Notes (Signed)
Subjective: She is still very weak. Some sob. No other events. Cardiology note reviewed.  Objective: Vital signs in last 24 hours: Temp:  [97.5 F (36.4 C)-97.7 F (36.5 C)] 97.5 F (36.4 C) (11/30 0500) Pulse Rate:  [69-72] 72  (11/30 0500) Resp:  [19-20] 20  (11/30 0500) BP: (104-134)/(54-70) 117/68 mmHg (11/30 0500) SpO2:  [98 %] 98 % (11/30 0500) Weight:  [70.3 kg (154 lb 15.7 oz)] 70.3 kg (154 lb 15.7 oz) (11/30 0500) Weight change: -1.8 kg (-3 lb 15.5 oz) Last BM Date: 02/08/12  Intake/Output from previous day: 11/29 0701 - 11/30 0700 In: 840 [P.O.:840] Out: 190 [Urine:190] Intake/Output this shift:    General appearance: alert, cooperative and appears stated age Resp: clear to auscultation bilaterally Cardio: regular rate and rhythm, S1, S2 normal, no murmur, click, rub or gallop GI: soft, non-tender; bowel sounds normal; no masses,  no organomegaly Extremities: extremities normal, atraumatic, no cyanosis or edema Neurologic: Grossly normal   Lab Results:  Basename 02/09/12 0500 02/08/12 0359  WBC 7.5 10.0  HGB 10.2* 10.9*  HCT 29.7* 30.5*  PLT 210 241   BMET  Basename 02/10/12 0500 02/09/12 0500  NA 136 134*  K 4.0 4.0  CL 101 102  CO2 26 24  GLUCOSE 85 90  BUN 28* 28*  CREATININE 1.52* 1.46*  CALCIUM 8.6 8.7   CMET CMP     Component Value Date/Time   NA 136 02/10/2012 0500   K 4.0 02/10/2012 0500   CL 101 02/10/2012 0500   CO2 26 02/10/2012 0500   GLUCOSE 85 02/10/2012 0500   BUN 28* 02/10/2012 0500   CREATININE 1.52* 02/10/2012 0500   CALCIUM 8.6 02/10/2012 0500   PROT 6.1 01/29/2012 1849   ALBUMIN 2.7* 01/29/2012 1849   AST 25 01/29/2012 1849   ALT 35 01/29/2012 1849   ALKPHOS 175* 01/29/2012 1849   BILITOT 0.9 01/29/2012 1849   GFRNONAA 31* 02/10/2012 0500   GFRAA 35* 02/10/2012 0500     Studies/Results: No results found.  Medications: I have reviewed the patient's current medications.     Marland Kitchen amiodarone  200 mg Oral Daily    . carvedilol  3.125 mg Oral BID WC  . digoxin  0.0625 mg Oral Daily  . feeding supplement  237 mL Oral BID BM  . furosemide  40 mg Oral Daily  . levothyroxine  100 mcg Oral Daily  . sodium chloride  250 mL Intravenous Once  . sodium chloride  10-40 mL Intracatheter Q12H  . Warfarin - Pharmacist Dosing Inpatient   Does not apply q1800  . [DISCONTINUED] furosemide  40 mg Oral Daily   Lab Results  Component Value Date   INR 3.34* 02/10/2012   INR 3.21* 02/09/2012   INR 2.69* 02/08/2012     Assessment/Plan:  Principal Problem:  *CHF (congestive heart failure), NYHA class IV-severe, per cardiology. Active Problems:  ANEMIA, IRON DEFICIENCY-follow  HYPERTENSION-stable.  CARDIOMYOPATHY, PRIMARY-per cardiology.  She is frail. She will go to [facility] Monday, then hopefully cardioversion and then hopefully transtion to [facility].  Atrial flutter-per cards.  ESOPHAGEAL STRICTURE-gi consult noted.     Acute on chronic renal failure-follow bun/cr  CVA (cerebral infarction) vs.  Transient ischemic attack-on therapy.  Anticoagulated on Coumadin-follow.  No new orders from me today.     LOS: 12 days   Ezequiel Kayser, MD 02/10/2012, 10:10 AM

## 2012-02-10 NOTE — Progress Notes (Signed)
Patient Name: [name] Date of Encounter: 02/10/2012  Primary cardiologist: Dr. Lewayne Bunting   SUBJECTIVE: Very weak, says had IV and PO Lasix yesterday (only PO Lasix in the computer) and urinated a great deal. I/O may be incomplete. Not eating very well. Will be going to [facility] after d/c.  OBJECTIVE Filed Vitals:   02/09/12 1300 02/09/12 1821 02/09/12 1941 02/10/12 0500  BP: 134/59 109/70 104/54 117/68  Pulse: 70 69 71 72  Temp: 97.7 F (36.5 C)  97.6 F (36.4 C) 97.5 F (36.4 C)  TempSrc:   Oral Oral  Resp:   19 20  Height:      Weight:    154 lb 15.7 oz (70.3 kg)  SpO2:   98% 98%    Intake/Output Summary (Last 24 hours) at 02/10/12 0834 Last data filed at 02/10/12 0600  Gross per 24 hour  Intake    840 ml  Output    190 ml  Net    650 ml   Filed Weights   02/08/12 0327 02/09/12 0500 02/10/12 0500  Weight: 167 lb 8.8 oz (76 kg) 158 lb 15.2 oz (72.1 kg) 154 lb 15.7 oz (70.3 kg)   PHYSICAL EXAM General: Well developed, well nourished, female in no acute distress. Head: Normocephalic, atraumatic.  Neck: Supple without bruits, JVD at 8 cm. Lungs:  Resp regular and unlabored, bibasilar rales, no crackles. Heart: RRR, S1, S2, no S3, S4,  murmur. Abdomen: Soft, non-tender, non-distended, BS + x 4.  Extremities: No clubbing, cyanosis, no edema.  Neuro: Alert and oriented X 3. Moves all extremities spontaneously. Psych: Normal affect.  LABS: CBC: Basename 02/09/12 0500 02/08/12 0359  WBC 7.5 10.0  NEUTROABS -- --  HGB 10.2* 10.9*  HCT 29.7* 30.5*  MCV 98.3 95.0  PLT 210 241   INR: Basename 02/10/12 0500  INR 3.34*   Basic Metabolic Panel: Basename 02/10/12 0500 02/09/12 0500  NA 136 134*  K 4.0 4.0  CL 101 102  CO2 26 24  GLUCOSE 85 90  BUN 28* 28*  CREATININE 1.52* 1.46*  CALCIUM 8.6 8.7  MG -- --  PHOS -- --   BNP: Pro B Natriuretic peptide (BNP)  Date/Time Value Range Status  01/31/2012  3:55 PM 25282.0* 0 - 450 pg/mL Final    01/29/2012  6:49 PM 15984.0* 0 - 450 pg/mL Final    TELE:   V pacing with some intrinsic beats  Radiology/Studies: Dg Chest Port 1 View 02/07/2012  *RADIOLOGY REPORT*  Clinical Data: 78 year old female with shortness of breath and CHF.  PORTABLE CHEST - 1 VIEW  Comparison: 02/03/2012 chest radiograph  Findings: Right lung airspace opacities are unchanged. Cardiomegaly and left-sided AICD / pacemaker again noted. A right PICC line is again identified with tip overlying cavoatrial junction. No definite pleural effusion or pneumothorax noted.  IMPRESSION: Stable chest radiograph with right lung airspace disease which may represent asymmetric edema or pneumonia.   Original Report Authenticated By: Harmon Pier, M.D.    Current Medications:     . amiodarone  200 mg Oral Daily  . carvedilol  3.125 mg Oral BID WC  . digoxin  0.0625 mg Oral Daily  . feeding supplement  237 mL Oral BID BM  . furosemide  40 mg Oral Daily  . levothyroxine  100 mcg Oral Daily  . sodium chloride  250 mL Intravenous Once  . sodium chloride  10-40 mL Intracatheter Q12H  . Warfarin - Pharmacist Dosing Inpatient  Does not apply q1800      . sodium chloride 10 mL/hr at 02/07/12 2000    ASSESSMENT AND PLAN: Principal Problem:  *CHF (congestive heart failure), NYHA class IV - Her weight is the lowest it's been, with increase in BUN/Cr, will hold Lasix today, recheck labs and hopefully restart in am.   Atrial fibrillation - continue Amio, Coreg and dig at current doses. She is pacing and very little ectopy/accelerated rates.  Coumadin anticoagulation - therapeutic coumadin, pharmacy dosing, will need d/c dosing from them and if we are to follow it after d/c, please advise.  Otherwise, per primary MD, GI. Active Problems:  ANEMIA, IRON DEFICIENCY  HYPERTENSION  CARDIOMYOPATHY, PRIMARY  ESOPHAGEAL STRICTURE  Acute esophageal obstruction  Dysphagia  Hypotension  Acute on chronic renal failure  CVA (cerebral  infarction)  Transient ischemic attack  SignedTheodore Demark , PA-C 8:34 AM 02/10/2012   Attending note:  Patient seen and examined. Reviewed recent note by Dr. Graciela Husbands. Heart rate has been very well controlled in atrial flutter on current regimen, Coumadin being followed by pharmacy with most recent INR 3.3. Intake and output reviewed, weight is down to 154 pounds with diuresis. Creatinine has bumped up from 1.3-1.5 since 11/27. Agree with holding Lasix today. Blood pressure otherwise stable, would continue Coreg. Would not add ACE inhibitor as yet until renal function shows improvement. She has history of a nonischemic cardiomyopathy with LVEF 20%.  Jonelle Sidle, M.D., F.A.C.C.

## 2012-02-11 LAB — BASIC METABOLIC PANEL
Calcium: 8.5 mg/dL (ref 8.4–10.5)
GFR calc non Af Amer: 33 mL/min — ABNORMAL LOW (ref >90)
Sodium: 134 mEq/L — ABNORMAL LOW (ref 135–145)

## 2012-02-11 LAB — PROTIME-INR
INR: 3.27 — ABNORMAL HIGH (ref 0.00–1.49)
Prothrombin Time: 31.5 seconds — ABNORMAL HIGH (ref 11.6–15.2)

## 2012-02-11 LAB — GLUCOSE, CAPILLARY: Glucose-Capillary: 101 mg/dL — ABNORMAL HIGH (ref 70–99)

## 2012-02-11 MED ORDER — ALTEPLASE 2 MG IJ SOLR
2.0000 mg | Freq: Once | INTRAMUSCULAR | Status: AC
  Administered 2012-02-11: 2 mg
  Filled 2012-02-11: qty 2

## 2012-02-11 MED ORDER — FUROSEMIDE 40 MG PO TABS
40.0000 mg | ORAL_TABLET | Freq: Every day | ORAL | Status: DC
  Administered 2012-02-11 – 2012-02-12 (×2): 40 mg via ORAL
  Filled 2012-02-11 (×2): qty 1

## 2012-02-11 NOTE — Progress Notes (Signed)
Subjective: No new events. Very weak still. Was up out of bed some however.  Objective: Vital signs in last 24 hours: Temp:  [97.7 F (36.5 C)-99.7 F (37.6 C)] 97.7 F (36.5 C) (12/01 0424) Pulse Rate:  [69-72] 70  (12/01 0424) Resp:  [18-20] 20  (12/01 0424) BP: (106-131)/(69-83) 120/70 mmHg (12/01 0803) SpO2:  [96 %-98 %] 98 % (12/01 0424) Weight:  [74.934 kg (165 lb 3.2 oz)] 74.934 kg (165 lb 3.2 oz) (12/01 0424) Weight change: 4.634 kg (10 lb 3.5 oz) Last BM Date: 02/10/12  Intake/Output from previous day: 11/30 0701 - 12/01 0700 In: 240 [P.O.:240] Out: 100 [Urine:100] Intake/Output this shift: Total I/O In: 240 [P.O.:240] Out: -   General appearance: alert and cooperative Resp: clear to auscultation bilaterally Cardio: regular rate and rhythm, S1, S2 normal, no murmur, click, rub or gallop GI: soft, non-tender; bowel sounds normal; no masses,  no organomegaly Extremities: extremities normal, atraumatic, no cyanosis or edema Neurologic: Grossly normal   Lab Results:  Basename 02/09/12 0500  WBC 7.5  HGB 10.2*  HCT 29.7*  PLT 210   BMET  Basename 02/11/12 0530 02/10/12 0500  NA 134* 136  K 3.6 4.0  CL 101 101  CO2 26 26  GLUCOSE 89 85  BUN 26* 28*  CREATININE 1.43* 1.52*  CALCIUM 8.5 8.6   CMET CMP     Component Value Date/Time   NA 134* 02/11/2012 0530   K 3.6 02/11/2012 0530   CL 101 02/11/2012 0530   CO2 26 02/11/2012 0530   GLUCOSE 89 02/11/2012 0530   BUN 26* 02/11/2012 0530   CREATININE 1.43* 02/11/2012 0530   CALCIUM 8.5 02/11/2012 0530   PROT 6.1 01/29/2012 1849   ALBUMIN 2.7* 01/29/2012 1849   AST 25 01/29/2012 1849   ALT 35 01/29/2012 1849   ALKPHOS 175* 01/29/2012 1849   BILITOT 0.9 01/29/2012 1849   GFRNONAA 33* 02/11/2012 0530   GFRAA 38* 02/11/2012 0530     Studies/Results: No results found.  Medications: I have reviewed the patient's current medications.     Marland Kitchen amiodarone  200 mg Oral Daily  . carvedilol  3.125 mg Oral BID  WC  . digoxin  0.0625 mg Oral Daily  . feeding supplement  237 mL Oral BID BM  . furosemide  40 mg Oral Daily  . levothyroxine  100 mcg Oral Daily  . sodium chloride  250 mL Intravenous Once  . sodium chloride  10-40 mL Intracatheter Q12H  . Warfarin - Pharmacist Dosing Inpatient   Does not apply q1800  . [DISCONTINUED] furosemide  40 mg Oral Daily   Lab Results  Component Value Date   INR 3.27* 02/11/2012   INR 3.34* 02/10/2012   INR 3.21* 02/09/2012     Assessment/Plan:  Principal Problem:  *CHF (congestive heart failure), NYHA class IV-per cards. Active Problems:  ANEMIA, IRON DEFICIENCY-hgb stable at 10 now.  HYPERTENSION-good bp control.  CARDIOMYOPATHY, PRIMARY-per cardiology. Lasix resume basically at old home dose.  Atrial flutter-per cardiology.  Plan for elective cardioversion at some point in the future.   Acute on chronic renal failure-gfr 33.  CVA (cerebral infarction)-no recurrent symptoms  Transient ischemic attack-see above.  Anticoagulated on Coumadin-stable. S/p esophageal dilation-doing okay. Plan for transfer to snf tomorrow.   LOS: 13 days   Traevon Meiring A, MD 02/11/2012, 10:59 AM

## 2012-02-11 NOTE — Progress Notes (Signed)
   Primary cardiologist: Dr. Lewayne Bunting  Subjective:   Up in chair this morning, still reports weakness, no significant shortness of breath however, no palpitations.   Objective:   Temp:  [97.7 F (36.5 C)-99.7 F (37.6 C)] 97.7 F (36.5 C) (12/01 0424) Pulse Rate:  [69-72] 70  (12/01 0424) Resp:  [18-20] 20  (12/01 0424) BP: (106-131)/(62-83) 120/70 mmHg (12/01 0803) SpO2:  [96 %-98 %] 98 % (12/01 0424) Weight:  [165 lb 3.2 oz (74.934 kg)] 165 lb 3.2 oz (74.934 kg) (12/01 0424) Last BM Date: 02/10/12  Filed Weights   02/09/12 0500 02/10/12 0500 02/11/12 0424  Weight: 158 lb 15.2 oz (72.1 kg) 154 lb 15.7 oz (70.3 kg) 165 lb 3.2 oz (74.934 kg)    Intake/Output Summary (Last 24 hours) at 02/11/12 0102 Last data filed at 02/10/12 1300  Gross per 24 hour  Intake    240 ml  Output    100 ml  Net    140 ml   Telemetry: Atrial flutter with ventricular pacing in the 70s.  Exam:  General: NAD.  Lungs: Decreased BS, but nonlabored.  Cardiac: Largely regular, no gallop.  Extremities: No pitting.  Lab Results:  Basic Metabolic Panel:  Lab 02/11/12 7253 02/10/12 0500 02/09/12 0500  NA 134* 136 134*  K 3.6 4.0 4.0  CL 101 101 102  CO2 26 26 24   GLUCOSE 89 85 90  BUN 26* 28* 28*  CREATININE 1.43* 1.52* 1.46*  CALCIUM 8.5 8.6 8.7  MG -- -- --    CBC:  Lab 02/09/12 0500 02/08/12 0359 02/07/12 0500  WBC 7.5 10.0 10.6*  HGB 10.2* 10.9* 10.8*  HCT 29.7* 30.5* 31.0*  MCV 98.3 95.0 96.6  PLT 210 241 268    Coagulation:  Lab 02/11/12 0530 02/10/12 0500 02/09/12 0500  INR 3.27* 3.34* 3.21*     Medications:   Scheduled Medications:    . amiodarone  200 mg Oral Daily  . carvedilol  3.125 mg Oral BID WC  . digoxin  0.0625 mg Oral Daily  . feeding supplement  237 mL Oral BID BM  . furosemide  40 mg Oral Daily  . levothyroxine  100 mcg Oral Daily  . sodium chloride  250 mL Intravenous Once  . sodium chloride  10-40 mL Intracatheter Q12H  . Warfarin -  Pharmacist Dosing Inpatient   Does not apply q1800  . [DISCONTINUED] furosemide  40 mg Oral Daily     Infusions:    . sodium chloride 10 mL/hr at 02/07/12 2000     PRN Medications:  acetaminophen, acetaminophen, bisacodyl, diclofenac sodium, HYDROcodone-acetaminophen, morphine injection, ondansetron (ZOFRAN) IV, ondansetron, sodium chloride, zolpidem   Assessment:   1. Atrial flutter, heart rate well controlled on current regimen. Continues on Coumadin under the direction of pharmacy.  2. Acute on chronic systolic heart failure, symptomatically improved. LVEF approximately 25% by recent echocardiogram.  3. Moderate mitral regurgitation.  4. Nonischemic cardiomyopathy status post CRT-P (previously CRT-D).  5. Acute on chronic renal insufficiency, creatinine down to 1.4. Lasix was held yesterday.   Plan/Discussion:    Continue current cardiac regimen, can likely resume Lasix at previous dose of 40 mg daily which she has also been on as an outpatient. Based on chart review, ultimate plan is to consider elective cardioversion as an outpatient, and continued followup with Dr. Ladona Ridgel. SNF is planned for Monday.   Jonelle Sidle, M.D., F.A.C.C.

## 2012-02-11 NOTE — Progress Notes (Signed)
ANTICOAGULATION CONSULT NOTE  Pharmacy Consult for Warfarin Indication: Hx Afib and possible new CVA this admit  Allergies  Allergen Reactions  . Iodinated Diagnostic Agents     Unknown   Labs:  Basename 02/11/12 0530 02/10/12 0500 02/09/12 0500  HGB -- -- 10.2*  HCT -- -- 29.7*  PLT -- -- 210  APTT -- -- --  LABPROT 31.5* 32.0* 31.1*  INR 3.27* 3.34* 3.21*  HEPARINUNFRC -- -- --  CREATININE 1.43* 1.52* 1.46*  CKTOTAL -- -- --  CKMB -- -- --  TROPONINI -- -- --    Estimated Creatinine Clearance: 28.2 ml/min (by C-G formula based on Cr of 1.43).   Assessment: 78 y.o. F on warfarin PTA (2mg  daily except 4mg  on Thursday) for hx Afib and admitted with elevated INR of 5.23 likely related to poor po intake/dysphagia PTA in the setting of hx GERD and esophageal stricture. Warfarin initially held and reversed with Vit K for GI procedures -- then resumed with heparin bridge on 11/22. The patient was noted to have slurred speech and expressive aphasia on 11/23 while off of warfarin -- likely TIA vs small CVA.  INR this AM = 3.27 (still elevated despite holding)  Goal of Therapy:   INR 2-3 Monitor platelets by anticoagulation protocol: Yes   Plan:  - No Warfarin today - Will continue to monitor for any signs/symptoms of bleeding and will follow up with PT/INR in the a.m.   Thank you. Okey Regal, PharmD [phone]  02/11/2012 7:59 AM

## 2012-02-12 LAB — BASIC METABOLIC PANEL
BUN: 26 mg/dL — ABNORMAL HIGH (ref 6–23)
Creatinine, Ser: 1.45 mg/dL — ABNORMAL HIGH (ref 0.50–1.10)
GFR calc Af Amer: 37 mL/min — ABNORMAL LOW (ref >90)
GFR calc non Af Amer: 32 mL/min — ABNORMAL LOW (ref >90)
Glucose, Bld: 86 mg/dL (ref 70–99)

## 2012-02-12 LAB — PROTIME-INR: Prothrombin Time: 29.8 seconds — ABNORMAL HIGH (ref 11.6–15.2)

## 2012-02-12 MED ORDER — DIGOXIN 0.0625 MG HALF TABLET: 0.0625 mg | ORAL_TABLET | Freq: Every day | ORAL | Status: DC

## 2012-02-12 MED ORDER — ENSURE COMPLETE PO LIQD: 237.0000 mL | Freq: Two times a day (BID) | ORAL | Status: DC

## 2012-02-12 MED ORDER — CARVEDILOL 3.125 MG PO TABS: 3.1250 mg | ORAL_TABLET | Freq: Two times a day (BID) | ORAL | Status: DC

## 2012-02-12 MED ORDER — WARFARIN SODIUM 1 MG PO TABS
1.0000 mg | ORAL_TABLET | Freq: Once | ORAL | Status: DC
  Filled 2012-02-12: qty 1

## 2012-02-12 MED ORDER — LEVOTHYROXINE SODIUM 100 MCG PO TABS: 100.0000 ug | ORAL_TABLET | Freq: Every day | ORAL | Status: DC

## 2012-02-12 NOTE — Progress Notes (Signed)
Clinical Social Work  CSW met with patient at bedside. Patient is agreeable to dc to [name] and reports that dtr has left to complete paperwork. CSW faxed dc summary to SNF who reports that patient can admit today. MD did not sign DNR golden form. CSW spoke with Chiropodist who stated to inform family, SNF and [facility] of form. SNF agreeable to get MD at facility to sign golden DNR form. CSW informed [facility] when call to transport patient. Patient aware of form and reports she will update facility of her plans when she arrives as well. CSW prepared dc packet. CSW coordinated transportation via [place]. CSW is signing off.  [place], [place] [phone] (Coverage for Frederico Hamman)

## 2012-02-12 NOTE — Progress Notes (Signed)
Pt c/o nausea; pt given 4mg IV Zofran at this time; will cont. To monitor. 

## 2012-02-12 NOTE — Discharge Summary (Signed)
Physician Discharge Summary  Patient ID: [name] MRN: [id number] DOB/AGE: 13-May-1926 78 y.o.  Admit date: 01/29/2012 Discharge date: 02/12/2012   Discharge Diagnoses:  Principal Problem:  *CHF (congestive heart failure), NYHA class IV Active Problems:  CARDIOMYOPATHY, PRIMARY  Atrial flutter  ESOPHAGEAL STRICTURE  Acute esophageal obstruction  Transient ischemic attack  ANEMIA, IRON DEFICIENCY  HYPERTENSION  Dysphagia  Hypotension  Acute on chronic renal failure  CVA (cerebral infarction)  Anticoagulated on Coumadin   Discharged Condition: good  Hospital Course: The patient is an 78 year old Caucasian woman with multiple medical problems, most notably idiopathic dilated cardiomyopathy with paroxysmal atrial fibrillation, and A remote history of gastroesophageal reflux with esophageal stricture.  She was admitted to the hospital because she could not keep down any food or fluids for the 3-4 days prior to admission.  She is having difficulty swallowing pills.  Her symptoms were of gradual onset over the past few weeks.  She is also feeling weaker and having difficulty walking.  The regurgitated material has the appearance of food, not that of emesis.  In December 2008 she had endoscopy with esophageal stricture dilatation.  She had not had problems with worsening of her chronic moderate dyspnea on exertion, chest pain, productive cough, or headaches.  She was admitted to medical bed with telemetry.  Her Coumadin was reversed with vitamin K and she had endoscopy done with this distal esophageal stricture dilatation.  The stricture had a few benign appearance and the procedure was done without immediate complication.  While her Coumadin had been restarted and her INR was not in the therapeutic range she had transient difficulty producing speech, which resolved with immediate resumption of heparin treatment overnight.  She did not have significant residual speech problems, dysphagia, or  upper extremity weakness.  She also went from a sinus rhythm to atrial fibrillation/flutter during her stay.  She is followed closely by her cardiologist who plan on doing and cardioversion after she has had 3 weeks of adequate Coumadin treatment.  She remains quite weak, but is able to eat foods and ambulate to the bathroom with assistance.  She also had an echocardiogram during her hospitalization that showed left ventricular ejection fraction of 15-20%. The plan will be to coordinate any cardioversion with Dr. Graciela Husbands and continue efforts at rehabilitation of her physical deconditioning and gait instability at the skilled nursing facility.  After she is discharged from the skilled nursing facility she can set up an office visit with her primary care physician, Dr. Jarome Matin.  Consults: cardiology and GI  Significant Diagnostic Studies:  No results found.  Labs: Lab Results  Component Value Date   WBC 7.5 02/09/2012   HGB 10.2* 02/09/2012   HCT 29.7* 02/09/2012   MCV 98.3 02/09/2012   PLT 210 02/09/2012     Lab 02/12/12 0420  NA 135  K 3.8  CL 101  CO2 26  BUN 26*  CREATININE 1.45*  CALCIUM 8.6  PROT --  BILITOT --  ALKPHOS --  ALT --  AST --  GLUCOSE 86       Lab Results  Component Value Date   INR 3.03* 02/12/2012   INR 3.27* 02/11/2012   INR 3.34* 02/10/2012     No results found for this or any previous visit (from the past 240 hour(s)).    Discharge Exam: Blood pressure 104/52, pulse 72, temperature 97.8 F (36.6 C), temperature source Oral, resp. rate 16, height 5\' 2"  (1.575 m), weight 73.619 kg (162 lb  4.8 oz), last menstrual period 01/10/2012, SpO2 97.00%.  Physical Exam: In general, she is an elderly white woman who was in no apparent distress while lying supine.  HEENT exam was within normal limits and she did not have official droop, neck was supple without jugular venous distention or carotid bruit, chest was clear to auscultation, heart had is slightly  irregular rhythm with grade 2/6 systolic ejection murmur, abdomen had normal bowel sounds and no tenderness, she had bilateral trace ankle edema.  She was able to move all extremities well.  She was alert and well oriented with normal affect.  Disposition:she'll be transferred to the skilled nursing facility for rehabilitation of physical deconditioning and optimization of her congestive heart failure treatment.  Discharge Orders    Future Orders Please Complete By Expires   Diet - low sodium heart healthy      Increase activity slowly      Discharge instructions      Comments:   She will be discharged to the skilled nursing facility for continued care and efforts at rehabilitation of physical deconditioning.  She is to have take cardioversion done to convert her atrial fibrillation/flutter in about 3 weeks, and this can be coordinated with Dr. Clayborne Artist office.  She should have her body weight checked regularly and her basic metabolic panel checked at least once weekly.  She will need to have her INR monitored closely.   Call MD for:      Comments:   Call physician for fever, chills, productive cough, worsening breathing, chest pain, nausea, vomiting, diarrhea, or other concerning symptoms.       Medication List     As of 02/12/2012  9:47 AM    STOP taking these medications         lipase/protease/amylase 16109 UNITS Cpep   Commonly known as: CREON-10/PANCREASE      TAKE these medications         acetaminophen 325 MG tablet   Commonly known as: TYLENOL   Take 650 mg by mouth every 6 (six) hours as needed. For pain/fever      amiodarone 200 MG tablet   Commonly known as: PACERONE   Take one tablet by mouth twice daily.      azelastine 0.05 % ophthalmic solution   Commonly known as: OPTIVAR   Place 1 drop into both eyes 2 (two) times daily.      bisacodyl 5 MG EC tablet   Commonly known as: DULCOLAX   Take 5-10 mg by mouth daily as needed. For constipation       carvedilol 3.125 MG tablet   Commonly known as: COREG   Take 1 tablet (3.125 mg total) by mouth 2 (two) times daily with a meal.      desloratadine 5 MG tablet   Commonly known as: CLARINEX   Take 5 mg by mouth daily.      digoxin 0.0625 mg Tabs   Commonly known as: LANOXIN   Take 0.5 tablets (0.0625 mg total) by mouth daily.      esomeprazole 40 MG capsule   Commonly known as: NEXIUM   Take 40 mg by mouth daily before breakfast.      feeding supplement Liqd   Take 237 mLs by mouth 2 (two) times daily between meals.      fish oil-omega-3 fatty acids 1000 MG capsule   Take 2 g by mouth daily.      furosemide 40 MG tablet   Commonly known as:  LASIX   Take 40 mg by mouth daily.      irbesartan 75 MG tablet   Commonly known as: AVAPRO   Take 75 mg by mouth at bedtime.      levothyroxine 100 MCG tablet   Commonly known as: SYNTHROID, LEVOTHROID   Take 1 tablet (100 mcg total) by mouth daily.      montelukast 10 MG tablet   Commonly known as: SINGULAIR   Take 10 mg by mouth at bedtime.      spironolactone 25 MG tablet   Commonly known as: ALDACTONE   Take 25 mg by mouth daily.      SYSTANE ULTRA OP   Apply 3 drops to eye 3 (three) times daily.      Vitamin D (Ergocalciferol) 50000 UNITS Caps   Commonly known as: DRISDOL   Take 50,000 Units by mouth every 7 (seven) days.      warfarin 2 MG tablet   Commonly known as: COUMADIN   Take 2-4 mg by mouth daily. Takes 4 mg on Thursday and 2 mg all other days           Follow-up Information    Follow up with SETHI,PRAMODKUMAR P, MD. In 2 months. (stroke clinic)    Contact information:   [address], [address] [facility] [place] [address] [phone]          Signed: Garlan Fillers 02/12/2012, 9:47 AM

## 2012-02-12 NOTE — Progress Notes (Signed)
ANTICOAGULATION CONSULT NOTE  Pharmacy Consult for Warfarin Indication: Hx Afib and possible new CVA this admit  Allergies  Allergen Reactions  . Iodinated Diagnostic Agents     Unknown   Labs:  Basename 02/12/12 0420 02/11/12 0530 02/10/12 0500  HGB -- -- --  HCT -- -- --  PLT -- -- --  APTT -- -- --  LABPROT 29.8* 31.5* 32.0*  INR 3.03* 3.27* 3.34*  HEPARINUNFRC -- -- --  CREATININE 1.45* 1.43* 1.52*  CKTOTAL -- -- --  CKMB -- -- --  TROPONINI -- -- --    Estimated Creatinine Clearance: 27.6 ml/min (by C-G formula based on Cr of 1.45).   Assessment: 78 y.o. F on warfarin PTA (2mg  daily except 4mg  on Thursday) for hx Afib and admitted with elevated INR of 5.23 likely related to poor po intake/dysphagia PTA in the setting of hx GERD and esophageal stricture. Warfarin initially held and reversed with Vit K for GI procedures -- then resumed with heparin bridge on 11/22. The patient was noted to have slurred speech and expressive aphasia on 11/23 while off of warfarin -- likely TIA vs small CVA.  INR this AM = 3.03  Goal of Therapy:   INR 2-3 Monitor platelets by anticoagulation protocol: Yes   Plan:  - Coumadin 1 mg today to prevent dropping INR after several doses held. - Will continue to monitor for any signs/symptoms of bleeding. -  Recommend 1 mg coumadin daily at discharge.  Thank you. Talbert Cage, PharmD [phone]  02/12/2012 7:58 AM

## 2012-02-12 NOTE — Progress Notes (Signed)
Pt resting at this time; no signs of nausea; will cont. To monitor.

## 2012-02-16 NOTE — Progress Notes (Signed)
Koa Zoeller Helen Whitlow PT, DPT  Pager: [phone] 

## 2012-02-20 ENCOUNTER — Other Ambulatory Visit: Payer: Self-pay | Admitting: Vascular Surgery

## 2012-02-20 ENCOUNTER — Other Ambulatory Visit: Payer: Self-pay | Admitting: *Deleted

## 2012-02-20 DIAGNOSIS — I739 Peripheral vascular disease, unspecified: Secondary | ICD-10-CM

## 2012-02-21 ENCOUNTER — Ambulatory Visit (INDEPENDENT_AMBULATORY_CARE_PROVIDER_SITE_OTHER): Payer: Medicare Other | Admitting: Vascular Surgery

## 2012-02-21 ENCOUNTER — Encounter: Payer: Self-pay | Admitting: Vascular Surgery

## 2012-02-21 ENCOUNTER — Encounter (INDEPENDENT_AMBULATORY_CARE_PROVIDER_SITE_OTHER): Payer: Medicare Other | Admitting: *Deleted

## 2012-02-21 VITALS — BP 122/68 | HR 73 | Temp 97.6°F | Resp 18 | Ht 62.0 in | Wt 162.0 lb

## 2012-02-21 DIAGNOSIS — I7025 Atherosclerosis of native arteries of other extremities with ulceration: Secondary | ICD-10-CM | POA: Insufficient documentation

## 2012-02-21 DIAGNOSIS — I739 Peripheral vascular disease, unspecified: Secondary | ICD-10-CM

## 2012-02-21 NOTE — Progress Notes (Signed)
Vascular and Vein Specialist of [facility]  Patient name: [name] MRN: [id number] DOB: [birth year] Sex: female  REASON FOR CONSULT: wounds on heels. Referred by Dr. Adrian Prince  HPI: [name] is a 78 y.o. female who was admitted to the hospital on 01/29/2012 with congestive heart failure and was discharged on 02/12/12 to a skilled nursing facility. She developed a small wounds on her heels and was referred for vascular consultation. Of note she has been nonambulatory for several weeks because of deconditioning. Prior to her hospitalization she was ambulatory with a walker. I do not get any clear-cut history of claudication although her activity is very limited. I do not get any history of rest pain or previous nonhealing ulcers.  She is getting some physical therapy at [facility].  I reviewed her discharge summary from her recent admission. Her primary problem was congestive heart failure. In addition she has a history of atrial flutter and some renal insufficiency. She's had a previous stroke in the past. She is on Coumadin.  Past Medical History  Diagnosis Date  . Benign neoplasm of colon   . Diverticulosis of colon (without mention of hemorrhage)   . Reflux esophagitis   . Gastric polyp     history of  . GERD (gastroesophageal reflux disease)   . Stricture and stenosis of esophagus   . Reactive airway disease   . Anemia, iron deficiency   . CHF (congestive heart failure)   . Restless leg syndrome   . Nonischemic cardiomyopathy     EF 20-25% 01-23-12 / BiV AICD  . Personal history of sudden cardiac death successfully resuscitated     status post cardioverter- defibrillator  . Atrial fibrillation   . Diabetes mellitus   . Osteoarthritis   . Hypertension   . UTI (lower urinary tract infection)   . v45.02   . Chronic kidney disease (CKD), stage III (moderate)   . Hyperlipemia   . GERD with stricture   . Anticoagulated on Coumadin     History reviewed. No pertinent  family history.  SOCIAL HISTORY: History  Substance Use Topics  . Smoking status: Never Smoker   . Smokeless tobacco: Never Used  . Alcohol Use: 3.6 oz/week    6 Shots of liquor per week     Comment: OCCASSIONAL    Allergies  Allergen Reactions  . Iodinated Diagnostic Agents     Unknown    Current Outpatient Prescriptions  Medication Sig Dispense Refill  . acetaminophen (TYLENOL) 325 MG tablet Take 650 mg by mouth every 6 (six) hours as needed. For pain/fever      . amiodarone (PACERONE) 200 MG tablet Take one tablet by mouth twice daily.  180 tablet  3  . azelastine (OPTIVAR) 0.05 % ophthalmic solution Place 1 drop into both eyes 2 (two) times daily.      . bisacodyl (DULCOLAX) 5 MG EC tablet Take 5-10 mg by mouth daily as needed. For constipation      . carvedilol (COREG) 3.125 MG tablet Take 1 tablet (3.125 mg total) by mouth 2 (two) times daily with a meal.  60 tablet  12  . desloratadine (CLARINEX) 5 MG tablet Take 5 mg by mouth daily.      . digoxin (LANOXIN) 0.0625 mg TABS Take 0.5 tablets (0.0625 mg total) by mouth daily.  30 tablet  12  . esomeprazole (NEXIUM) 40 MG capsule Take 40 mg by mouth daily before breakfast.      . feeding supplement (ENSURE  COMPLETE) LIQD Take 237 mLs by mouth 2 (two) times daily between meals.  60 Bottle  12  . fish oil-omega-3 fatty acids 1000 MG capsule Take 2 g by mouth daily.      . furosemide (LASIX) 40 MG tablet Take 40 mg by mouth daily.      . irbesartan (AVAPRO) 75 MG tablet Take 75 mg by mouth at bedtime.      Marland Kitchen levothyroxine (SYNTHROID, LEVOTHROID) 100 MCG tablet Take 1 tablet (100 mcg total) by mouth daily.  30 tablet  12  . montelukast (SINGULAIR) 10 MG tablet Take 10 mg by mouth at bedtime.      Bertram Gala Glycol-Propyl Glycol (SYSTANE ULTRA OP) Apply 3 drops to eye 3 (three) times daily.      Marland Kitchen spironolactone (ALDACTONE) 25 MG tablet Take 25 mg by mouth daily.      . Vitamin D, Ergocalciferol, (DRISDOL) 50000 UNITS CAPS Take  50,000 Units by mouth every 7 (seven) days.      Marland Kitchen warfarin (COUMADIN) 2 MG tablet Take 2-4 mg by mouth daily. Takes 4 mg on Thursday and 2 mg all other days        REVIEW OF SYSTEMS: Arly.Keller ] denotes positive finding; [  ] denotes negative finding  CARDIOVASCULAR:  [ ]  chest pain   [ ]  chest pressure   [ ]  palpitations   [ ]  orthopnea   Arly.Keller ] dyspnea on exertion   [ ]  claudication   [ ]  rest pain   [ ]  DVT   [ ]  phlebitis PULMONARY:   [ ]  productive cough   [ ]  asthma   [ ]  wheezing NEUROLOGIC:   [ ]  weakness  [ ]  paresthesias  [ ]  aphasia  [ ]  amaurosis  [ ]  dizziness HEMATOLOGIC:   [ ]  bleeding problems   [ ]  clotting disorders MUSCULOSKELETAL:  Arly.Keller ] joint pain   [ ]  joint swelling [ ]  leg swelling GASTROINTESTINAL: [ ]   blood in stool  [ ]   hematemesis GENITOURINARY:  [ ]   dysuria  [ ]   hematuria PSYCHIATRIC:  [ ]  history of major depression INTEGUMENTARY:  [ ]  rashes  [ ]  ulcers CONSTITUTIONAL:  [ ]  fever   [ ]  chills  PHYSICAL EXAM: Filed Vitals:   02/21/12 1327  BP: 122/68  Pulse: 73  Temp: 97.6 F (36.4 C)  TempSrc: Oral  Resp: 18  Height: 5\' 2"  (1.575 m)  Weight: 162 lb (73.483 kg)  SpO2: 97%   Body mass index is 29.63 kg/(m^2). GENERAL: The patient is a well-nourished female, in no acute distress. The vital signs are documented above. CARDIOVASCULAR: There is a regular rate and rhythm. I do not detect carotid bruits. She has palpable femoral pulses. I cannot palpate pedal pulses. Both feet appear adequately perfused. She has mild bilateral lower extremity swelling. PULMONARY: There is good air exchange bilaterally without wheezing or rales. ABDOMEN: Soft and non-tender with normal pitched bowel sounds.  MUSCULOSKELETAL: There are no major deformities or cyanosis. NEUROLOGIC: No focal weakness or paresthesias are detected. SKIN: the skin on her heels is dry with some small cracks but no significant ulceration is noted. PSYCHIATRIC: The patient has a normal affect.  DATA:   I have independently interpreted her arterial Doppler study in our office today which shows triphasic Doppler signals in the right posterior tibial and dorsalis pedis positions. Likewise on the left she has triphasic signals in her left posterior tibial and dorsalis pedis arteries. Pressure  the right is 70 mmHg. Toe pressure on the left is 68 mmHg.  I also reviewed her ultrasound study that was done in [place] on 02/15/2012. This was a limited study but she had biphasic and triphasic waveforms in both lower extremities.  MEDICAL ISSUES: Based on her arterial Doppler study she has no evidence of significant aortoiliac occlusive disease or infrainguinal arterial occlusive disease. I simply recommended that the nursing facility keep her skin well-lubricated and be sure to keep pressure off of her heels. I would not recommend any further vascular workup and the she developed a nonhealing wound. Clearly with her age and cardiac history she would be at significant risk for any invasive procedures. I'll see her back as needed.   Gerrard Crystal S [facility]: [phone]

## 2012-02-26 ENCOUNTER — Encounter: Payer: Self-pay | Admitting: Physician Assistant

## 2012-02-26 ENCOUNTER — Ambulatory Visit (INDEPENDENT_AMBULATORY_CARE_PROVIDER_SITE_OTHER): Payer: Medicare Other | Admitting: Physician Assistant

## 2012-02-26 VITALS — BP 100/66 | HR 70 | Ht 62.0 in | Wt 162.0 lb

## 2012-02-26 DIAGNOSIS — I4892 Unspecified atrial flutter: Secondary | ICD-10-CM

## 2012-02-26 DIAGNOSIS — I10 Essential (primary) hypertension: Secondary | ICD-10-CM

## 2012-02-26 DIAGNOSIS — N189 Chronic kidney disease, unspecified: Secondary | ICD-10-CM

## 2012-02-26 DIAGNOSIS — I6350 Cerebral infarction due to unspecified occlusion or stenosis of unspecified cerebral artery: Secondary | ICD-10-CM

## 2012-02-26 DIAGNOSIS — I639 Cerebral infarction, unspecified: Secondary | ICD-10-CM

## 2012-02-26 DIAGNOSIS — I5022 Chronic systolic (congestive) heart failure: Secondary | ICD-10-CM

## 2012-02-26 LAB — BASIC METABOLIC PANEL
BUN: 31 mg/dL — ABNORMAL HIGH (ref 6–23)
Chloride: 101 mEq/L (ref 96–112)
Creatinine, Ser: 1.5 mg/dL — ABNORMAL HIGH (ref 0.4–1.2)
Glucose, Bld: 156 mg/dL — ABNORMAL HIGH (ref 70–99)
Potassium: 4.5 mEq/L (ref 3.5–5.1)

## 2012-02-26 NOTE — Patient Instructions (Addendum)
PLEASE FOLLOW UP WITH DR. Graciela Husbands IN 1 MONTH PER SCOTT WEAVER, PAC,  LAB TODAY; BMET  NO CHANGES WERE MADE TODAY

## 2012-02-26 NOTE — Progress Notes (Signed)
[address], [address] [place], [place]  [address] Phone: [phone], Fax:  [phone]  Date:  02/26/2012   Name:  [name]   DOB:  1926/04/19   MRN:  [id number]  PCP:  Garlan Fillers, MD  Primary Cardiologist/Electrophysiologist:  Dr. Sherryl Manges     History of Present Illness: [name] is a 78 y.o. female who returns for follow up after a recent prolonged admission to the hospital.    She has a hx of nonischemic cardiomyopathy, systolic CHF, paroxysmal atrial fibrillation, symptomatic bradycardia, status post CRT-D changed to CRT-P in 3/12 (gen change c/b pocket hematoma), stage III CKD, esophageal stricture status post dilatation, DM2, HTN.  She was admitted 11/18-12/2. She presented with worsening dysphagia and regurgitation of food and fluids. She was admitted for evaluation and plan was for EGD by gastroenterology.  However, she developed pulmonary edema prior to her procedure. EGD was canceled and she was seen by cardiology. Volume overload occurred after receiving a second bag of FFP to reverse her Coumadin in the setting of a supratherapeutic INR. Echo 02/01/12: EF 15-25% with inferoseptal, posterior, inferior and apical AK, mild AI, moderate MR, questionable small PFO, PASP 40. After being diuresed, she went for EGD and dilatation of her esophagus. After this she developed worsening respiratory distress and hypotension. She was noted to have atrial fibrillation/flutter and episodes of nonsustained ventricular tachycardia on interrogation of her device. Hospitalization was then complicated by symptoms of aphasia thought to be related to a small left brain embolic stroke. She was followed by the stroke service. Carotid Dopplers were negative for significant ICA stenosis. Heart rate remained well-controlled. Volume status improved. She did have worsening renal insufficiency and her adjustments were made in her diuretics. After extensive review of her chart, it  appears as though some discussion was had regarding whether or not to proceed with cardioversion.  She is now at [facility].  She is doing ok.  No chest pain.  No significant dyspnea.  No orthopnea, PND.  LE edema is somewhat worse.  She has noted a gradual increase in weight of 5 lbs.  She attributes this to improved appetite.  No syncope.  She tells me her INRs have been therapeutic.    Labs (11/13):   LDL 193, Hgb 10.2 Labs (12/13):   K 3.8, creatinine 1.45  Wt Readings from Last 3 Encounters:  02/21/12 162 lb (73.483 kg)  02/12/12 162 lb 4.8 oz (73.619 kg)  02/12/12 162 lb 4.8 oz (73.619 kg)     Past Medical History  Diagnosis Date  . Benign neoplasm of colon   . Diverticulosis of colon (without mention of hemorrhage)   . Reflux esophagitis   . Gastric polyp     history of  . GERD (gastroesophageal reflux disease)   . Stricture and stenosis of esophagus   . Reactive airway disease   . Anemia, iron deficiency   . CHF (congestive heart failure)   . Restless leg syndrome   . Nonischemic cardiomyopathy     EF 20-25% 01-23-12 / BiV AICD  . Personal history of sudden cardiac death successfully resuscitated     status post cardioverter- defibrillator  . Atrial fibrillation   . Diabetes mellitus   . Osteoarthritis   . Hypertension   . UTI (lower urinary tract infection)   . v45.02   . Chronic kidney disease (CKD), stage III (moderate)   . Hyperlipemia   . GERD with stricture   . Anticoagulated  on Coumadin     Current Outpatient Prescriptions  Medication Sig Dispense Refill  . acetaminophen (TYLENOL) 325 MG tablet Take 650 mg by mouth every 6 (six) hours as needed. For pain/fever      . amiodarone (PACERONE) 200 MG tablet Take one tablet by mouth twice daily.  180 tablet  3  . azelastine (OPTIVAR) 0.05 % ophthalmic solution Place 1 drop into both eyes 2 (two) times daily.      . bisacodyl (DULCOLAX) 5 MG EC tablet Take 5-10 mg by mouth daily as needed. For constipation       . carvedilol (COREG) 3.125 MG tablet Take 1 tablet (3.125 mg total) by mouth 2 (two) times daily with a meal.  60 tablet  12  . desloratadine (CLARINEX) 5 MG tablet Take 5 mg by mouth daily.      . digoxin (LANOXIN) 0.0625 mg TABS Take 0.5 tablets (0.0625 mg total) by mouth daily.  30 tablet  12  . esomeprazole (NEXIUM) 40 MG capsule Take 40 mg by mouth daily before breakfast.      . feeding supplement (ENSURE COMPLETE) LIQD Take 237 mLs by mouth 2 (two) times daily between meals.  60 Bottle  12  . fish oil-omega-3 fatty acids 1000 MG capsule Take 2 g by mouth daily.      . furosemide (LASIX) 40 MG tablet Take 40 mg by mouth daily.      . irbesartan (AVAPRO) 75 MG tablet Take 75 mg by mouth at bedtime.      Marland Kitchen levothyroxine (SYNTHROID, LEVOTHROID) 100 MCG tablet Take 1 tablet (100 mcg total) by mouth daily.  30 tablet  12  . montelukast (SINGULAIR) 10 MG tablet Take 10 mg by mouth at bedtime.      Bertram Gala Glycol-Propyl Glycol (SYSTANE ULTRA OP) Apply 3 drops to eye 3 (three) times daily.      Marland Kitchen spironolactone (ALDACTONE) 25 MG tablet Take 25 mg by mouth daily.      . Vitamin D, Ergocalciferol, (DRISDOL) 50000 UNITS CAPS Take 50,000 Units by mouth every 7 (seven) days.      Marland Kitchen warfarin (COUMADIN) 2 MG tablet Take 2-4 mg by mouth daily. Takes 4 mg on Thursday and 2 mg all other days        Allergies: Allergies  Allergen Reactions  . Iodinated Diagnostic Agents     Unknown    Social History:  The patient  reports that she has never smoked. She has never used smokeless tobacco. She reports that she drinks about 3.6 ounces of alcohol per week. She reports that she does not use illicit drugs.   ROS:  Please see the history of present illness.    All other systems reviewed and negative.   PHYSICAL EXAM: VS:  BP 100/66  Pulse 70  Ht 5\' 2"  (1.575 m)  Wt 162 lb (73.483 kg)  BMI 29.63 kg/m2  LMP 01/10/2012 Well nourished, well developed, in no acute distress HEENT: normal Neck: no JVD at  90 degrees Cardiac:  normal S1, S2; RRR; no murmur Lungs:  clear to auscultation bilaterally, no wheezing, rhonchi or rales Abd: soft, nontender, no hepatomegaly Ext: trace to 1+ bilateral ankle edema Skin: warm and dry Neuro:  CNs 2-12 intact, no focal abnormalities noted  EKG:  V paced, HR  70     ASSESSMENT AND PLAN:  1. Chronic Systolic CHF:   Overall, her volume appears to be stable. Check a basic metabolic panel today. Continue current therapy.  2. Atrial Fibrillation/Flutter:   We did interrogate her device today. She is still in AFib. There was some discussion about whether or not to pursue cardioversion to restore normal sinus rhythm. She remains on amiodarone. We will obtain INRs from her nursing home to ensure that this has remained therapeutic since discharge. I will review this further with Dr. Graciela Husbands to see if he prefers that she pursue cardioversion.  3. S/p Stroke:   She has recovered nicely.  She continues rehab at [facility].  4. Hypertension:   Controlled.  Continue current therapy.  5. Chronic Kidney Disease:   Check BMET today.  6. Disposition:   Follow up with Dr. Sherryl Manges in 1 month.  As noted, I will review with him to decide +/- DCCV prior to then.    Signed, Tereso Newcomer, PA-C  2:08 PM 02/26/2012

## 2012-02-27 ENCOUNTER — Telehealth: Payer: Self-pay | Admitting: *Deleted

## 2012-02-27 NOTE — Telephone Encounter (Signed)
Message copied by Tarri Fuller on Tue Feb 27, 2012  2:12 PM ------      Message from: [place], [place] T      Created: Tue Feb 27, 2012  8:03 AM       Renal function stable.      Continue with current treatment plan.      Tereso Newcomer, PA-C  8:03 AM 02/27/2012

## 2012-02-27 NOTE — Telephone Encounter (Signed)
pt's daughter Annice Pih notified about lab results w/verbal understanding. I advised that I w/c [facility] with results as well as fax results to NH today, daughter Annice Pih said ok and thank you.

## 2012-02-29 ENCOUNTER — Telehealth: Payer: Self-pay | Admitting: Internal Medicine

## 2012-02-29 NOTE — Telephone Encounter (Signed)
Pt's dtr calling re pt will need a cardioversion or not?

## 2012-02-29 NOTE — Telephone Encounter (Signed)
Daughter notified that per Tereso Newcomer and Dr. Graciela Husbands we are not proceeding with a cardioversion at this point.  Pt should keep scheduled appt to see Dr. Graciela Husbands in Feb 2014.

## 2012-04-18 ENCOUNTER — Ambulatory Visit (INDEPENDENT_AMBULATORY_CARE_PROVIDER_SITE_OTHER): Payer: Medicare Other | Admitting: Internal Medicine

## 2012-04-18 ENCOUNTER — Encounter: Payer: Self-pay | Admitting: Internal Medicine

## 2012-04-18 VITALS — BP 100/70 | Wt 158.0 lb

## 2012-04-18 DIAGNOSIS — I255 Ischemic cardiomyopathy: Secondary | ICD-10-CM | POA: Insufficient documentation

## 2012-04-18 DIAGNOSIS — L659 Nonscarring hair loss, unspecified: Secondary | ICD-10-CM

## 2012-04-18 DIAGNOSIS — I5022 Chronic systolic (congestive) heart failure: Secondary | ICD-10-CM

## 2012-04-18 DIAGNOSIS — I4892 Unspecified atrial flutter: Secondary | ICD-10-CM

## 2012-04-18 DIAGNOSIS — I2589 Other forms of chronic ischemic heart disease: Secondary | ICD-10-CM

## 2012-04-18 DIAGNOSIS — I428 Other cardiomyopathies: Secondary | ICD-10-CM

## 2012-04-18 DIAGNOSIS — I4891 Unspecified atrial fibrillation: Secondary | ICD-10-CM

## 2012-04-18 DIAGNOSIS — I472 Ventricular tachycardia, unspecified: Secondary | ICD-10-CM

## 2012-04-18 LAB — PACEMAKER DEVICE OBSERVATION
AL IMPEDENCE PM: 402 Ohm
ATRIAL PACING PM: 46
BAMS-0003: 70 {beats}/min
LV LEAD THRESHOLD: 1 V
VENTRICULAR PACING PM: 98

## 2012-04-18 LAB — BASIC METABOLIC PANEL
CO2: 25 mEq/L (ref 19–32)
Calcium: 8.7 mg/dL (ref 8.4–10.5)
Chloride: 100 mEq/L (ref 96–112)
Potassium: 5.1 mEq/L (ref 3.5–5.1)
Sodium: 133 mEq/L — ABNORMAL LOW (ref 135–145)

## 2012-04-18 NOTE — Assessment & Plan Note (Addendum)
As above; will check TSH

## 2012-04-18 NOTE — Assessment & Plan Note (Signed)
She has regained sinus rhythm. Hopefully this will improve functional capacity. We will recheck a metabolic profile today. She has had renal insufficiency issues. We'll also check a digoxin level and anticipate augmenting diuresis.

## 2012-04-18 NOTE — Patient Instructions (Addendum)
Your physician has recommended you make the following change in your medication:  1) decrease amiodarone to 200 mg once daily. 2) increase lasix (furosemide) to 40 mg one tablet twice daily 3) stop coreg (carvedilol)  Your physician recommends that you have lab work today: bmp/tsh/digoxin  Your physician recommends that you schedule a follow-up appointment in:  1) 2 weeks with Tereso Newcomer, PA 2) 3 months with Dr. Graciela Husbands

## 2012-04-18 NOTE — Assessment & Plan Note (Signed)
She has reverted to sinus rhythm. We will decrease her amiodarone dose from 200-100 mg a day.

## 2012-04-18 NOTE — Progress Notes (Signed)
Patient Care Team: Jarome Matin, MD as PCP - General Duke Salvia, MD as Attending Physician (Cardiology)   HPI  [name] is a 79 y.o. female Seen in followup for CRT-D implantation and was changed out for a CRT P. 2012.   The procedure was complicated by a hematoma that was successfully managed noninvasively with Elastoplast dressing   She was hospitalized 11/13 for dysphagia. That hospitalization was complicated by heart failure in part triggered by FFP given to reverse Coumadin. Echo 11/13>> EF 15-25% with multiple wall motion abnormalities and moderate MR. There is moderate pulmonary hypertension.  She also told of atrial fibrillation and there is an episode of a stated that was thought to be a thromboembolic event. She is on warfarin. She is also on amiodarone and requested that remains is whether she should be cardioverted back to sinus rhythm    Her  major complaints  are significant alopecia as well as shortness of breath which is stable and fatigue which is persistent    Past Medical History  Diagnosis Date  . Benign neoplasm of colon   . Diverticulosis of colon (without mention of hemorrhage)   . Reflux esophagitis   . Gastric polyp     history of  . GERD (gastroesophageal reflux disease)   . Stricture and stenosis of esophagus   . Reactive airway disease   . Anemia, iron deficiency   . CHF (congestive heart failure)   . Restless leg syndrome   . Nonischemic cardiomyopathy     EF 20-25% 01-23-12 / BiV AICD  . Personal history of sudden cardiac death successfully resuscitated     status post cardioverter- defibrillator  . Atrial fibrillation   . Diabetes mellitus   . Osteoarthritis   . Hypertension   . UTI (lower urinary tract infection)   . v45.02   . Chronic kidney disease (CKD), stage III (moderate)   . Hyperlipemia   . GERD with stricture   . Anticoagulated on Coumadin     Past Surgical History  Procedure Date  . Cardiac defibrillator placement  1999, 2001, 3.30.2012    [facility], Removal of previously implanted BiV ICD followed by insertion of BiV pacemaker with pacemaker pocket revision. without immediate procedural complications.   . Eye surgery 2002    cataract   . Total hip arthroplasty 2004    right  . Insert / replace / remove pacemaker   . Joint replacement   . Esophagogastroduodenoscopy (egd) with esophageal dilation 01/31/2012    Procedure: ESOPHAGOGASTRODUODENOSCOPY (EGD) WITH ESOPHAGEAL DILATION;  Surgeon: Rachael Fee, MD;  Location: [facility] ENDOSCOPY;  Service: Endoscopy;  Laterality: N/A;  . Esophagogastroduodenoscopy 02/01/2012    Procedure: ESOPHAGOGASTRODUODENOSCOPY (EGD);  Surgeon: Rachael Fee, MD;  Location: [facility] ENDOSCOPY;  Service: Endoscopy;  Laterality: N/A;    Current Outpatient Prescriptions  Medication Sig Dispense Refill  . acetaminophen (TYLENOL) 325 MG tablet Take 650 mg by mouth every 6 (six) hours as needed. For pain/fever      . amiodarone (PACERONE) 200 MG tablet Take one tablet by mouth twice daily.  180 tablet  3  . azelastine (OPTIVAR) 0.05 % ophthalmic solution Place 1 drop into both eyes 2 (two) times daily.      . bisacodyl (DULCOLAX) 5 MG EC tablet Take 5-10 mg by mouth daily as needed. For constipation      . carvedilol (COREG) 3.125 MG tablet Take 1 tablet (3.125 mg total) by mouth 2 (two) times daily with a meal.  60 tablet  12  . desloratadine (CLARINEX) 5 MG tablet Take 5 mg by mouth daily.      . digoxin (LANOXIN) 0.0625 mg TABS Take 0.5 tablets (0.0625 mg total) by mouth daily.  30 tablet  12  . esomeprazole (NEXIUM) 40 MG capsule Take 40 mg by mouth daily before breakfast.      . feeding supplement (ENSURE COMPLETE) LIQD Take 237 mLs by mouth 2 (two) times daily between meals.  60 Bottle  12  . fish oil-omega-3 fatty acids 1000 MG capsule Take 2 g by mouth daily.      . furosemide (LASIX) 40 MG tablet Take 40 mg by mouth daily.      . irbesartan (AVAPRO) 75 MG tablet Take 75  mg by mouth at bedtime.      Marland Kitchen levothyroxine (SYNTHROID, LEVOTHROID) 100 MCG tablet Take 1 tablet (100 mcg total) by mouth daily.  30 tablet  12  . montelukast (SINGULAIR) 10 MG tablet Take 10 mg by mouth at bedtime.      Bertram Gala Glycol-Propyl Glycol (SYSTANE ULTRA OP) Apply 3 drops to eye 3 (three) times daily.      Marland Kitchen spironolactone (ALDACTONE) 25 MG tablet Take 25 mg by mouth daily.      . Vitamin D, Ergocalciferol, (DRISDOL) 50000 UNITS CAPS Take 50,000 Units by mouth every 7 (seven) days.      Marland Kitchen warfarin (COUMADIN) 2 MG tablet Take 2-4 mg by mouth daily. Takes 4 mg on Thursday and 2 mg all other days        Allergies  Allergen Reactions  . Iodinated Diagnostic Agents     Unknown    Review of Systems negative except from HPI and PMH  Physical Exam BP 100/70  Wt 158 lb (71.668 kg)  LMP 01/10/2012 Well developed and well nourished sitting in chair in no acute distress HENT normal E scleral and icterus clear Neck Supple JVP flat; carotids brisk and full Clear to ausculation Regular rate and rhythm, no murmurs gallops or rub Soft with active bowel sounds No clubbing cyanosis 2+ Edema Alert and oriented, grossly normal motor and sensory function Skin Warm and Dry    Assessment and  Plan

## 2012-04-18 NOTE — Assessment & Plan Note (Signed)
No intercurrent Ventricular tachycardia  

## 2012-04-18 NOTE — Assessment & Plan Note (Signed)
Significant left ventricular dysfunction. She has significant alopecia. We will hold her carvedilol for number of weeks and see if it doesn't resolve. We understand it is a risk benefit assessment. We hope to use the withdrawal as a diagnostic strategy to see if we need to look elsewhere for causes of alopecia

## 2012-04-19 ENCOUNTER — Telehealth: Payer: Self-pay | Admitting: Internal Medicine

## 2012-04-19 LAB — DIGOXIN LEVEL: Digoxin Level: 2.8 ng/mL (ref 0.8–2.0)

## 2012-04-19 NOTE — Telephone Encounter (Signed)
I left a message for [name] that the patient should be on lasix 40 mg one tablet BID.

## 2012-04-19 NOTE — Telephone Encounter (Signed)
Pt's dtr questioning her dosage of lasix, pls call to confirm

## 2012-05-02 ENCOUNTER — Ambulatory Visit (INDEPENDENT_AMBULATORY_CARE_PROVIDER_SITE_OTHER): Payer: Medicare Other | Admitting: Physician Assistant

## 2012-05-02 VITALS — BP 95/65 | HR 71 | Ht 62.0 in | Wt 153.1 lb

## 2012-05-02 DIAGNOSIS — I5022 Chronic systolic (congestive) heart failure: Secondary | ICD-10-CM

## 2012-05-02 DIAGNOSIS — I4891 Unspecified atrial fibrillation: Secondary | ICD-10-CM

## 2012-05-02 DIAGNOSIS — L659 Nonscarring hair loss, unspecified: Secondary | ICD-10-CM

## 2012-05-02 DIAGNOSIS — N189 Chronic kidney disease, unspecified: Secondary | ICD-10-CM

## 2012-05-02 LAB — CBC WITH DIFFERENTIAL/PLATELET
Basophils Relative: 0.2 % (ref 0.0–3.0)
Eosinophils Absolute: 0 10*3/uL (ref 0.0–0.7)
Hemoglobin: 12 g/dL (ref 12.0–15.0)
Lymphocytes Relative: 9.1 % — ABNORMAL LOW (ref 12.0–46.0)
MCHC: 33.6 g/dL (ref 30.0–36.0)
Monocytes Relative: 7.2 % (ref 3.0–12.0)
Neutro Abs: 8.8 10*3/uL — ABNORMAL HIGH (ref 1.4–7.7)
RBC: 3.57 Mil/uL — ABNORMAL LOW (ref 3.87–5.11)

## 2012-05-02 LAB — BASIC METABOLIC PANEL
CO2: 27 mEq/L (ref 19–32)
Calcium: 9 mg/dL (ref 8.4–10.5)
Potassium: 4.3 mEq/L (ref 3.5–5.1)
Sodium: 131 mEq/L — ABNORMAL LOW (ref 135–145)

## 2012-05-02 LAB — HEPATIC FUNCTION PANEL
AST: 83 U/L — ABNORMAL HIGH (ref 0–37)
Albumin: 3.5 g/dL (ref 3.5–5.2)
Alkaline Phosphatase: 122 U/L — ABNORMAL HIGH (ref 39–117)
Total Protein: 7 g/dL (ref 6.0–8.3)

## 2012-05-02 NOTE — Progress Notes (Signed)
[address], [address] [place], [place]  [address] Phone: [phone], Fax:  [phone]  Date:  05/02/2012   ID:  [name], [name] 1926-06-22, MRN [id number]  PCP:  Garlan Fillers, MD  Primary Cardiologist/Electrophysiologist:  Dr. Sherryl Manges    History of Present Illness: [name] is a 79 y.o. female who returns for followup.  She has a hx of NICM, systolic CHF, paroxysmal AFib, symptomatic bradycardia, status post CRT-D changed to CRT-P in 3/12 (gen change c/b pocket hematoma), stage III CKD, esophageal stricture status post dilatation, DM2, HTN.   She had a complicated admission in 01/2011. She presented with worsening dysphagia and regurgitation of food and fluids.  Hospital course was c/b pulmonary edema prior to planned EGD.  Echo 02/01/12: EF 15-25% with inferoseptal, posterior, inferior and apical AK, mild AI, moderate MR, questionable small PFO, PASP 40.  She had recurrent worsening respiratory distress and hypotension and was noted to have AFib/flutter and episodes of NSVT on interrogation of her device. Hospitalization was further c/b a small left brain embolic stroke. Carotid Dopplers were negative for significant ICA stenosis.   Patient saw Dr. Graciela Husbands in followup 04/18/12. She was noted to be back in NSR. She complained of significant alopecia. Amiodarone was decreased from 200-100 mg daily. Her carvedilol was placed on hold to see if this would improve her alopecia. followup labs demonstrated an elevated digoxin level, borderline elevated potassium and elevated creatinine. Her spironolactone and digoxin were both stopped.  Since last seen, she notes that her loss of hair has slowed down. She has chronic dyspnea with exertion. This is unchanged. She is NYHA class III. She still resides at [facility]. She denies chest pain or syncope. She denies orthopnea or PND. LE edema is improved. She denies palpitations.  Labs (11/13):  LDL 193, Hgb 10.2  Labs  (69/62):  K 3.8, creatinine 1.45 Labs (2/14):    Na 133, K 5.1, BUN 40, creatinine 2.2, TSH 0.64, Dig 2.8  Wt Readings from Last 3 Encounters:  05/02/12 153 lb 1.9 oz (69.455 kg)  04/18/12 158 lb (71.668 kg)  02/26/12 162 lb (73.483 kg)     Past Medical History  Diagnosis Date  . Benign neoplasm of colon   . Diverticulosis of colon (without mention of hemorrhage)   . Reflux esophagitis   . Gastric polyp     history of  . GERD (gastroesophageal reflux disease)   . Stricture and stenosis of esophagus   . Reactive airway disease   . Anemia, iron deficiency   . CHF (congestive heart failure)   . Restless leg syndrome   . Nonischemic cardiomyopathy     EF 20-25% 01-23-12 / BiV AICD  . Personal history of sudden cardiac death successfully resuscitated     status post cardioverter- defibrillator  . Atrial fibrillation   . Diabetes mellitus   . Osteoarthritis   . Hypertension   . UTI (lower urinary tract infection)   . v45.02   . Chronic kidney disease (CKD), stage III (moderate)   . Hyperlipemia   . GERD with stricture   . Anticoagulated on Coumadin     Current Outpatient Prescriptions  Medication Sig Dispense Refill  . acetaminophen (TYLENOL) 325 MG tablet Take 650 mg by mouth every 6 (six) hours as needed. For pain/fever      . amiodarone (PACERONE) 200 MG tablet Take 1 tablet (200 mg total) by mouth daily.      Marland Kitchen azelastine (OPTIVAR) 0.05 %  ophthalmic solution Place 1 drop into both eyes 2 (two) times daily.      . bisacodyl (DULCOLAX) 5 MG EC tablet Take 5-10 mg by mouth daily as needed. For constipation      . desloratadine (CLARINEX) 5 MG tablet Take 5 mg by mouth daily.      Marland Kitchen esomeprazole (NEXIUM) 40 MG capsule Take 40 mg by mouth daily before breakfast.      . feeding supplement (ENSURE COMPLETE) LIQD Take 237 mLs by mouth 2 (two) times daily between meals.  60 Bottle  12  . fish oil-omega-3 fatty acids 1000 MG capsule Take 2 g by mouth daily.      . furosemide  (LASIX) 40 MG tablet Take 40 mg by mouth daily.       . irbesartan (AVAPRO) 75 MG tablet Take 75 mg by mouth at bedtime.      Marland Kitchen levothyroxine (SYNTHROID, LEVOTHROID) 100 MCG tablet Take 1 tablet (100 mcg total) by mouth daily.  30 tablet  12  . montelukast (SINGULAIR) 10 MG tablet Take 10 mg by mouth at bedtime.      Bertram Gala Glycol-Propyl Glycol (SYSTANE ULTRA OP) Apply 3 drops to eye 3 (three) times daily.      . Vitamin D, Ergocalciferol, (DRISDOL) 50000 UNITS CAPS Take 50,000 Units by mouth every 7 (seven) days.      Marland Kitchen warfarin (COUMADIN) 2 MG tablet Take 2-4 mg by mouth daily. Takes 4 mg on Thursday and 2 mg all other days       No current facility-administered medications for this visit.    Allergies:    Allergies  Allergen Reactions  . Iodinated Diagnostic Agents     Unknown    Social History:  The patient  reports that she has never smoked. She has never used smokeless tobacco. She reports that she drinks about 3.6 ounces of alcohol per week. She reports that she does not use illicit drugs.   ROS:  Please see the history of present illness.   She has a chronic cough. No hemoptysis.   All other systems reviewed and negative.   PHYSICAL EXAM: VS:  BP 95/65  Pulse 71  Ht 5\' 2"  (1.575 m)  Wt 153 lb 1.9 oz (69.455 kg)  BMI 28 kg/m2  SpO2 99%  LMP 01/10/2012 Well nourished, well developed, in no acute distress HEENT: normal Neck: no JVD at 90 Cardiac:  normal S1, S2; RRR; no murmur Lungs:  clear to auscultation bilaterally, no wheezing, rhonchi or rales Abd: soft, nontender, no hepatomegaly Ext: 1+ bilateral LE edema Skin: warm and dry Neuro:  CNs 2-12 intact, no focal abnormalities noted  EKG:  AV paced, HR 71     ASSESSMENT AND PLAN:  1. Alopecia:  She mainly describes hair loss in the frontal and vertex areas of her scalp. These would be in the distribution of female pattern baldness. Her hair loss is likely from an acquired condition. She seems to have noticed a  decrease in the amount of hair loss since stopping her beta blocker. However, other possibilities include amiodarone, protein calorie malnutrition and recent episodes of significant physical stress. She is maintaining sinus rhythm on amiodarone. Albumin in 11/13 was low. She does not have a very good appetite. For now, she can remain off of the carvedilol. I have asked her to increase protein intake. I have recommended protein supplement shakes 3 times a day. She will be brought back to reassess in 2-3 weeks. Consider starting  back on carvedilol at that time. I will check a CBC and LFTs today. 2. Chronic Systolic CHF:  Volume stable. She is off of beta blocker therapy for reasons as stated above. Continue ARB. She is now off of spironolactone due to worsening renal function and potassium. She is also off of her digoxin due to significantly elevated digoxin levels. Repeat a basic metabolic panel today. 3. Chronic Kidney Disease:  Check a basic metabolic panel today. 4. Atrial Fibrillation:  She is maintaining sinus rhythm. She remains on amiodarone. Recent TSH was okay. Check LFTs today as noted above. 5. Disposition: Keep follow up with Dr. Graciela Husbands in 5/14. I will see her again in 2-3 weeks. Consider restarting carvedilol at that time depending on her progression of her loss.  Luna Glasgow, PA-C  11:29 AM 05/02/2012

## 2012-05-02 NOTE — Patient Instructions (Addendum)
NO MEDICATION CHANGES WERE MADE TODAY  LABS TODAY; BMET, CBC W/DIFF, LFT  YOU HAVE A FOLLOW UP APPT WITH SCOTT WEAVER, [facility] ON 05/14/12 @ 8:50 AM

## 2012-05-06 ENCOUNTER — Encounter: Payer: Self-pay | Admitting: Cardiology

## 2012-05-10 ENCOUNTER — Telehealth: Payer: Self-pay | Admitting: Internal Medicine

## 2012-05-10 NOTE — Telephone Encounter (Signed)
New problem    Returning call back to nurse.   

## 2012-05-10 NOTE — Telephone Encounter (Signed)
I spoke with the patient's daughter. She states the message was from me, but it could have been very old as the patient has been in rehab and just gotten home yesterday. I informed her I don't have any new information for her and we will see the patient next week as scheduled.

## 2012-05-14 ENCOUNTER — Ambulatory Visit: Payer: Medicare Other | Admitting: Physician Assistant

## 2012-05-16 ENCOUNTER — Inpatient Hospital Stay (HOSPITAL_COMMUNITY)
Admission: EM | Admit: 2012-05-16 | Discharge: 2012-05-21 | DRG: 312 | Disposition: A | Discharge status: Home / Self Care | Payer: Medicare Other | Attending: Internal Medicine | Admitting: Internal Medicine

## 2012-05-16 ENCOUNTER — Telehealth: Payer: Self-pay | Admitting: Physician Assistant

## 2012-05-16 ENCOUNTER — Emergency Department (HOSPITAL_COMMUNITY): Payer: Medicare Other

## 2012-05-16 ENCOUNTER — Encounter (HOSPITAL_COMMUNITY): Payer: Self-pay | Admitting: *Deleted

## 2012-05-16 DIAGNOSIS — Z7901 Long term (current) use of anticoagulants: Secondary | ICD-10-CM

## 2012-05-16 DIAGNOSIS — E785 Hyperlipidemia, unspecified: Secondary | ICD-10-CM

## 2012-05-16 DIAGNOSIS — I10 Essential (primary) hypertension: Secondary | ICD-10-CM

## 2012-05-16 DIAGNOSIS — G8929 Other chronic pain: Secondary | ICD-10-CM | POA: Diagnosis present

## 2012-05-16 DIAGNOSIS — N183 Chronic kidney disease, stage 3 unspecified: Secondary | ICD-10-CM | POA: Diagnosis present

## 2012-05-16 DIAGNOSIS — G2581 Restless legs syndrome: Secondary | ICD-10-CM

## 2012-05-16 DIAGNOSIS — L659 Nonscarring hair loss, unspecified: Secondary | ICD-10-CM

## 2012-05-16 DIAGNOSIS — R1310 Dysphagia, unspecified: Secondary | ICD-10-CM

## 2012-05-16 DIAGNOSIS — R531 Weakness: Secondary | ICD-10-CM

## 2012-05-16 DIAGNOSIS — Z8674 Personal history of sudden cardiac arrest: Secondary | ICD-10-CM

## 2012-05-16 DIAGNOSIS — I255 Ischemic cardiomyopathy: Secondary | ICD-10-CM

## 2012-05-16 DIAGNOSIS — I472 Ventricular tachycardia, unspecified: Secondary | ICD-10-CM

## 2012-05-16 DIAGNOSIS — G459 Transient cerebral ischemic attack, unspecified: Secondary | ICD-10-CM

## 2012-05-16 DIAGNOSIS — Z95810 Presence of automatic (implantable) cardiac defibrillator: Secondary | ICD-10-CM

## 2012-05-16 DIAGNOSIS — R634 Abnormal weight loss: Secondary | ICD-10-CM

## 2012-05-16 DIAGNOSIS — D509 Iron deficiency anemia, unspecified: Secondary | ICD-10-CM

## 2012-05-16 DIAGNOSIS — R630 Anorexia: Secondary | ICD-10-CM

## 2012-05-16 DIAGNOSIS — Z91041 Radiographic dye allergy status: Secondary | ICD-10-CM

## 2012-05-16 DIAGNOSIS — I5022 Chronic systolic (congestive) heart failure: Secondary | ICD-10-CM

## 2012-05-16 DIAGNOSIS — K224 Dyskinesia of esophagus: Secondary | ICD-10-CM

## 2012-05-16 DIAGNOSIS — Z950 Presence of cardiac pacemaker: Secondary | ICD-10-CM

## 2012-05-16 DIAGNOSIS — K2100 Gastro-esophageal reflux disease with esophagitis, without bleeding: Secondary | ICD-10-CM

## 2012-05-16 DIAGNOSIS — I428 Other cardiomyopathies: Secondary | ICD-10-CM

## 2012-05-16 DIAGNOSIS — I4729 Other ventricular tachycardia: Secondary | ICD-10-CM | POA: Diagnosis present

## 2012-05-16 DIAGNOSIS — E46 Unspecified protein-calorie malnutrition: Secondary | ICD-10-CM

## 2012-05-16 DIAGNOSIS — Z8719 Personal history of other diseases of the digestive system: Secondary | ICD-10-CM

## 2012-05-16 DIAGNOSIS — I509 Heart failure, unspecified: Secondary | ICD-10-CM

## 2012-05-16 DIAGNOSIS — K222 Esophageal obstruction: Secondary | ICD-10-CM

## 2012-05-16 DIAGNOSIS — N390 Urinary tract infection, site not specified: Secondary | ICD-10-CM

## 2012-05-16 DIAGNOSIS — N289 Disorder of kidney and ureter, unspecified: Secondary | ICD-10-CM

## 2012-05-16 DIAGNOSIS — D126 Benign neoplasm of colon, unspecified: Secondary | ICD-10-CM

## 2012-05-16 DIAGNOSIS — R7401 Elevation of levels of liver transaminase levels: Secondary | ICD-10-CM | POA: Diagnosis present

## 2012-05-16 DIAGNOSIS — K5900 Constipation, unspecified: Secondary | ICD-10-CM | POA: Diagnosis present

## 2012-05-16 DIAGNOSIS — R748 Abnormal levels of other serum enzymes: Secondary | ICD-10-CM

## 2012-05-16 DIAGNOSIS — E7800 Pure hypercholesterolemia, unspecified: Secondary | ICD-10-CM

## 2012-05-16 DIAGNOSIS — M545 Low back pain, unspecified: Secondary | ICD-10-CM | POA: Diagnosis present

## 2012-05-16 DIAGNOSIS — I4891 Unspecified atrial fibrillation: Secondary | ICD-10-CM | POA: Diagnosis present

## 2012-05-16 DIAGNOSIS — L98499 Non-pressure chronic ulcer of skin of other sites with unspecified severity: Secondary | ICD-10-CM

## 2012-05-16 DIAGNOSIS — I4892 Unspecified atrial flutter: Secondary | ICD-10-CM

## 2012-05-16 DIAGNOSIS — N179 Acute kidney failure, unspecified: Secondary | ICD-10-CM

## 2012-05-16 DIAGNOSIS — R55 Syncope and collapse: Principal | ICD-10-CM

## 2012-05-16 DIAGNOSIS — Z79899 Other long term (current) drug therapy: Secondary | ICD-10-CM

## 2012-05-16 DIAGNOSIS — I959 Hypotension, unspecified: Secondary | ICD-10-CM

## 2012-05-16 DIAGNOSIS — K219 Gastro-esophageal reflux disease without esophagitis: Secondary | ICD-10-CM

## 2012-05-16 DIAGNOSIS — Z8673 Personal history of transient ischemic attack (TIA), and cerebral infarction without residual deficits: Secondary | ICD-10-CM

## 2012-05-16 DIAGNOSIS — J45909 Unspecified asthma, uncomplicated: Secondary | ICD-10-CM

## 2012-05-16 DIAGNOSIS — R7402 Elevation of levels of lactic acid dehydrogenase (LDH): Secondary | ICD-10-CM | POA: Diagnosis present

## 2012-05-16 DIAGNOSIS — I739 Peripheral vascular disease, unspecified: Secondary | ICD-10-CM

## 2012-05-16 DIAGNOSIS — Z66 Do not resuscitate: Secondary | ICD-10-CM | POA: Diagnosis present

## 2012-05-16 DIAGNOSIS — I639 Cerebral infarction, unspecified: Secondary | ICD-10-CM

## 2012-05-16 DIAGNOSIS — I129 Hypertensive chronic kidney disease with stage 1 through stage 4 chronic kidney disease, or unspecified chronic kidney disease: Secondary | ICD-10-CM | POA: Diagnosis present

## 2012-05-16 DIAGNOSIS — E119 Type 2 diabetes mellitus without complications: Secondary | ICD-10-CM | POA: Diagnosis present

## 2012-05-16 DIAGNOSIS — K5730 Diverticulosis of large intestine without perforation or abscess without bleeding: Secondary | ICD-10-CM

## 2012-05-16 HISTORY — DX: Unspecified asthma, uncomplicated: J45.909

## 2012-05-16 HISTORY — DX: Shortness of breath: R06.02

## 2012-05-16 LAB — URINALYSIS, ROUTINE W REFLEX MICROSCOPIC
Glucose, UA: NEGATIVE mg/dL
Hgb urine dipstick: NEGATIVE
Protein, ur: NEGATIVE mg/dL
pH: 6.5 (ref 5.0–8.0)

## 2012-05-16 LAB — CBC WITH DIFFERENTIAL/PLATELET
Basophils Absolute: 0 10*3/uL (ref 0.0–0.1)
Basophils Relative: 0 % (ref 0–1)
MCHC: 34.8 g/dL (ref 30.0–36.0)
Neutro Abs: 8.9 10*3/uL — ABNORMAL HIGH (ref 1.7–7.7)
Neutrophils Relative %: 86 % — ABNORMAL HIGH (ref 43–77)
Platelets: 225 10*3/uL (ref 150–400)
RDW: 14.3 % (ref 11.5–15.5)

## 2012-05-16 LAB — COMPREHENSIVE METABOLIC PANEL
AST: 96 U/L — ABNORMAL HIGH (ref 0–37)
Albumin: 2.8 g/dL — ABNORMAL LOW (ref 3.5–5.2)
Alkaline Phosphatase: 150 U/L — ABNORMAL HIGH (ref 39–117)
Chloride: 99 mEq/L (ref 96–112)
Potassium: 4.6 mEq/L (ref 3.5–5.1)
Total Bilirubin: 0.6 mg/dL (ref 0.3–1.2)

## 2012-05-16 LAB — PROTIME-INR: Prothrombin Time: 20.5 seconds — ABNORMAL HIGH (ref 11.6–15.2)

## 2012-05-16 LAB — TROPONIN I: Troponin I: <0.3 ng/mL (ref <0.30)

## 2012-05-16 LAB — GLUCOSE, CAPILLARY

## 2012-05-16 MED ORDER — MONTELUKAST SODIUM 10 MG PO TABS
10.0000 mg | ORAL_TABLET | Freq: Every day | ORAL | Status: DC
  Administered 2012-05-16 – 2012-05-20 (×5): 10 mg via ORAL
  Filled 2012-05-16 (×6): qty 1

## 2012-05-16 MED ORDER — POTASSIUM CHLORIDE IN NACL 20-0.9 MEQ/L-% IV SOLN
INTRAVENOUS | Status: DC
  Administered 2012-05-16 – 2012-05-17 (×2): via INTRAVENOUS
  Administered 2012-05-17: 20 mL via INTRAVENOUS
  Filled 2012-05-16 (×4): qty 1000

## 2012-05-16 MED ORDER — METHOCARBAMOL 500 MG PO TABS
500.0000 mg | ORAL_TABLET | Freq: Three times a day (TID) | ORAL | Status: DC | PRN
  Filled 2012-05-16: qty 1

## 2012-05-16 MED ORDER — MIRTAZAPINE 7.5 MG PO TABS
7.5000 mg | ORAL_TABLET | Freq: Every day | ORAL | Status: DC
  Administered 2012-05-16: 7.5 mg via ORAL
  Filled 2012-05-16 (×2): qty 1

## 2012-05-16 MED ORDER — PANTOPRAZOLE SODIUM 40 MG PO TBEC
40.0000 mg | DELAYED_RELEASE_TABLET | Freq: Every day | ORAL | Status: DC
  Administered 2012-05-17 – 2012-05-21 (×5): 40 mg via ORAL
  Filled 2012-05-16 (×4): qty 1

## 2012-05-16 MED ORDER — OMEGA-3-ACID ETHYL ESTERS 1 G PO CAPS
1.0000 g | ORAL_CAPSULE | Freq: Every day | ORAL | Status: DC
  Administered 2012-05-17 – 2012-05-21 (×5): 1 g via ORAL
  Filled 2012-05-16 (×5): qty 1

## 2012-05-16 MED ORDER — POLYETHYLENE GLYCOL 3350 17 G PO PACK
17.0000 g | PACK | Freq: Every day | ORAL | Status: DC | PRN
  Filled 2012-05-16 (×2): qty 1

## 2012-05-16 MED ORDER — BISACODYL 5 MG PO TBEC
5.0000 mg | DELAYED_RELEASE_TABLET | Freq: Every day | ORAL | Status: DC | PRN
  Filled 2012-05-16: qty 1

## 2012-05-16 MED ORDER — WARFARIN - PHARMACIST DOSING INPATIENT
Freq: Every day | Status: DC
  Administered 2012-05-17 – 2012-05-20 (×2)

## 2012-05-16 MED ORDER — AMIODARONE HCL 200 MG PO TABS
200.0000 mg | ORAL_TABLET | Freq: Every day | ORAL | Status: DC
  Administered 2012-05-17 – 2012-05-18 (×2): 200 mg via ORAL
  Filled 2012-05-16 (×2): qty 1

## 2012-05-16 MED ORDER — IRBESARTAN 75 MG PO TABS
75.0000 mg | ORAL_TABLET | Freq: Every day | ORAL | Status: DC
  Administered 2012-05-17 – 2012-05-21 (×5): 75 mg via ORAL
  Filled 2012-05-16 (×5): qty 1

## 2012-05-16 MED ORDER — SODIUM CHLORIDE 0.9 % IV BOLUS (SEPSIS)
500.0000 mL | Freq: Once | INTRAVENOUS | Status: AC
  Administered 2012-05-16: 500 mL via INTRAVENOUS

## 2012-05-16 MED ORDER — ZOLPIDEM TARTRATE 5 MG PO TABS: 5.0000 mg | ORAL_TABLET | Freq: Every evening | ORAL | Status: DC | PRN

## 2012-05-16 MED ORDER — ALUM & MAG HYDROXIDE-SIMETH 200-200-20 MG/5ML PO SUSP: 30.0000 mL | Freq: Four times a day (QID) | ORAL | Status: DC | PRN

## 2012-05-16 MED ORDER — TRAMADOL HCL 50 MG PO TABS
50.0000 mg | ORAL_TABLET | Freq: Every day | ORAL | Status: DC
  Administered 2012-05-17 – 2012-05-21 (×5): 50 mg via ORAL
  Filled 2012-05-16 (×4): qty 1

## 2012-05-16 MED ORDER — MECLIZINE HCL 25 MG PO TABS
25.0000 mg | ORAL_TABLET | Freq: Once | ORAL | Status: AC
  Administered 2012-05-16: 25 mg via ORAL
  Filled 2012-05-16: qty 1

## 2012-05-16 MED ORDER — VITAMIN D (ERGOCALCIFEROL) 1.25 MG (50000 UNIT) PO CAPS
50000.0000 [IU] | ORAL_CAPSULE | ORAL | Status: DC
  Administered 2012-05-21: 50000 [IU] via ORAL
  Filled 2012-05-16: qty 1

## 2012-05-16 MED ORDER — LORATADINE 10 MG PO TABS
10.0000 mg | ORAL_TABLET | Freq: Every day | ORAL | Status: DC
  Administered 2012-05-17 – 2012-05-21 (×5): 10 mg via ORAL
  Filled 2012-05-16 (×5): qty 1

## 2012-05-16 MED ORDER — WARFARIN SODIUM 2 MG PO TABS
2.0000 mg | ORAL_TABLET | Freq: Once | ORAL | Status: AC
  Administered 2012-05-16: 2 mg via ORAL
  Filled 2012-05-16: qty 1

## 2012-05-16 MED ORDER — OLOPATADINE HCL 0.1 % OP SOLN
1.0000 [drp] | Freq: Two times a day (BID) | OPHTHALMIC | Status: DC
  Administered 2012-05-17 – 2012-05-21 (×9): 1 [drp] via OPHTHALMIC
  Filled 2012-05-16: qty 5

## 2012-05-16 MED ORDER — OMEGA-3 FATTY ACIDS 1000 MG PO CAPS: 1.0000 g | ORAL_CAPSULE | Freq: Every day | ORAL | Status: DC

## 2012-05-16 MED ORDER — AZELASTINE HCL 0.05 % OP SOLN: 1.0000 [drp] | Freq: Two times a day (BID) | OPHTHALMIC | Status: DC

## 2012-05-16 MED ORDER — PROMETHAZINE HCL 12.5 MG PO TABS: 12.5000 mg | ORAL_TABLET | Freq: Four times a day (QID) | ORAL | Status: DC | PRN

## 2012-05-16 MED ORDER — LEVOTHYROXINE SODIUM 100 MCG PO TABS
100.0000 ug | ORAL_TABLET | Freq: Every day | ORAL | Status: DC
  Administered 2012-05-17 – 2012-05-21 (×5): 100 ug via ORAL
  Filled 2012-05-16 (×6): qty 1

## 2012-05-16 MED ORDER — ACETAMINOPHEN 325 MG PO TABS: 650.0000 mg | ORAL_TABLET | Freq: Four times a day (QID) | ORAL | Status: DC | PRN

## 2012-05-16 MED ORDER — SODIUM CHLORIDE 0.9 % IJ SOLN
3.0000 mL | Freq: Two times a day (BID) | INTRAMUSCULAR | Status: DC
  Administered 2012-05-16 – 2012-05-20 (×5): 3 mL via INTRAVENOUS

## 2012-05-16 NOTE — ED Provider Notes (Signed)
History     CSN: [id number]  Arrival date & time 05/16/12  1023   First MD Initiated Contact with Patient 05/16/12 1048      Chief Complaint  Patient presents with  . Near Syncope    (Consider location/radiation/quality/duration/timing/severity/associated sxs/prior treatment) HPI [name] is a 79 y.o. female who presents with complaint of near syncopal episode this morning. Pt states she was walking to the door when suddenly became lightheaded, dizzy, weak. States felt like "legs were going to give out and i was going to pass  Out." Pt states she was able to make it to a chair and sit down. States dizziness persisted. States felt like room was spinning but also felt lightheaded. States still feeling dizzy. States symptoms do get better with closing her eyes, worse with movement. Admits to shortness of breath. Denies chest pain. Denies headache. No recent URIs. Just got out of [facility] facility where she was since she had a stroke in November. PT admits to not eating or drinking as well.    Past Medical History  Diagnosis Date  . Benign neoplasm of colon   . Diverticulosis of colon (without mention of hemorrhage)   . Reflux esophagitis   . Gastric polyp     history of  . GERD (gastroesophageal reflux disease)   . Stricture and stenosis of esophagus   . Reactive airway disease   . Anemia, iron deficiency   . CHF (congestive heart failure)   . Restless leg syndrome   . Nonischemic cardiomyopathy     EF 20-25% 01-23-12 / BiV AICD  . Personal history of sudden cardiac death successfully resuscitated     status post cardioverter- defibrillator  . Atrial fibrillation   . Diabetes mellitus   . Osteoarthritis   . Hypertension   . UTI (lower urinary tract infection)   . v45.02   . Chronic kidney disease (CKD), stage III (moderate)   . Hyperlipemia   . GERD with stricture   . Anticoagulated on Coumadin     Past Surgical History  Procedure Laterality Date  . Cardiac  defibrillator placement  1999, 2001, 3.30.2012    [facility], Removal of previously implanted BiV ICD followed by insertion of BiV pacemaker with pacemaker pocket revision. without immediate procedural complications.   . Eye surgery  2002    cataract   . Total hip arthroplasty  2004    right  . Insert / replace / remove pacemaker    . Joint replacement    . Esophagogastroduodenoscopy (egd) with esophageal dilation  01/31/2012    Procedure: ESOPHAGOGASTRODUODENOSCOPY (EGD) WITH ESOPHAGEAL DILATION;  Surgeon: Rachael Fee, MD;  Location: [facility] ENDOSCOPY;  Service: Endoscopy;  Laterality: N/A;  . Esophagogastroduodenoscopy  02/01/2012    Procedure: ESOPHAGOGASTRODUODENOSCOPY (EGD);  Surgeon: Rachael Fee, MD;  Location: [facility] ENDOSCOPY;  Service: Endoscopy;  Laterality: N/A;    No family history on file.  History  Substance Use Topics  . Smoking status: Never Smoker   . Smokeless tobacco: Never Used  . Alcohol Use: 3.6 oz/week    6 Shots of liquor per week     Comment: OCCASSIONAL    OB History   Grav Para Term Preterm Abortions TAB SAB Ect Mult Living                  Review of Systems  Constitutional: Negative for fever and chills.  HENT: Negative for neck pain and neck stiffness.   Respiratory: Positive for shortness of  breath. Negative for chest tightness.   Cardiovascular: Negative.   Gastrointestinal: Negative.   Genitourinary: Negative for dysuria and flank pain.  Neurological: Positive for dizziness and light-headedness. Negative for weakness and headaches.  All other systems reviewed and are negative.    Allergies  Iodinated diagnostic agents  Home Medications   Current Outpatient Rx  Name  Route  Sig  Dispense  Refill  . acetaminophen (TYLENOL) 325 MG tablet   Oral   Take 650 mg by mouth every 6 (six) hours as needed. For pain/fever         . amiodarone (PACERONE) 200 MG tablet   Oral   Take 1 tablet (200 mg total) by mouth daily.         Marland Kitchen  azelastine (OPTIVAR) 0.05 % ophthalmic solution   Both Eyes   Place 1 drop into both eyes 2 (two) times daily.         . bisacodyl (DULCOLAX) 5 MG EC tablet   Oral   Take 5-10 mg by mouth daily as needed. For constipation         . desloratadine (CLARINEX) 5 MG tablet   Oral   Take 5 mg by mouth daily.         Marland Kitchen esomeprazole (NEXIUM) 40 MG capsule   Oral   Take 40 mg by mouth daily before breakfast.         . feeding supplement (ENSURE COMPLETE) LIQD   Oral   Take 237 mLs by mouth 2 (two) times daily between meals.   60 Bottle   12   . fish oil-omega-3 fatty acids 1000 MG capsule   Oral   Take 2 g by mouth daily.         . furosemide (LASIX) 40 MG tablet   Oral   Take 40 mg by mouth daily.          . irbesartan (AVAPRO) 75 MG tablet   Oral   Take 75 mg by mouth at bedtime.         Marland Kitchen levothyroxine (SYNTHROID, LEVOTHROID) 100 MCG tablet   Oral   Take 1 tablet (100 mcg total) by mouth daily.   30 tablet   12   . montelukast (SINGULAIR) 10 MG tablet   Oral   Take 10 mg by mouth at bedtime.         Bertram Gala Glycol-Propyl Glycol (SYSTANE ULTRA OP)   Ophthalmic   Apply 3 drops to eye 3 (three) times daily.         . Vitamin D, Ergocalciferol, (DRISDOL) 50000 UNITS CAPS   Oral   Take 50,000 Units by mouth every 7 (seven) days.         Marland Kitchen warfarin (COUMADIN) 2 MG tablet   Oral   Take 2-4 mg by mouth daily. Takes 4 mg on Thursday and 2 mg all other days           BP 153/63  Temp(Src) 98.7 F (37.1 C) (Oral)  Ht 5\' 2"  (1.575 m)  Wt 151 lb (68.493 kg)  BMI 27.61 kg/m2  SpO2 99%  LMP 01/10/2012  Physical Exam  Nursing note and vitals reviewed. Constitutional: She is oriented to person, place, and time. She appears well-developed and well-nourished.  HENT:  Head: Normocephalic and atraumatic.  Eyes: Conjunctivae and EOM are normal. Pupils are equal, round, and reactive to light.  Neck: Neck supple.  Cardiovascular: Normal rate,  regular rhythm and normal heart sounds.   Pulmonary/Chest:  Effort normal and breath sounds normal. No respiratory distress. She has no wheezes. She has no rales.  Abdominal: Soft. Bowel sounds are normal.  Musculoskeletal: She exhibits no edema.  Neurological: She is alert and oriented to person, place, and time. No cranial nerve deficit. She exhibits normal muscle tone. Coordination normal.  5/5 and equal upper and lower extremity strength bilaterally. Equal grip strength bilaterally. Normal finger to nose and heel to shin. No pronator drift.  Skin: Skin is warm and dry.  Psychiatric: She has a normal mood and affect. Her behavior is normal.    ED Course  Procedures (including critical care time)   Date: 05/16/2012  Rate:  111  Rhythm: ventricularly paced rhythm  Old EKG Reviewed: changes noted tachycardia new  Pt with near syncope. Now stating did not have any vertigo like symptoms. Will get labs, troponin, CXR.   Results for orders placed during the hospital encounter of 05/16/12  CBC WITH DIFFERENTIAL      Result Value Range   WBC 10.4  4.0 - 10.5 K/uL   RBC 3.70 (*) 3.87 - 5.11 MIL/uL   Hemoglobin 12.2  12.0 - 15.0 g/dL   HCT 09.8 (*) 11.9 - 14.7 %   MCV 94.9  78.0 - 100.0 fL   MCH 33.0  26.0 - 34.0 pg   MCHC 34.8  30.0 - 36.0 g/dL   RDW 82.9  56.2 - 13.0 %   Platelets 225  150 - 400 K/uL   Neutrophils Relative 86 (*) 43 - 77 %   Neutro Abs 8.9 (*) 1.7 - 7.7 K/uL   Lymphocytes Relative 7 (*) 12 - 46 %   Lymphs Abs 0.7  0.7 - 4.0 K/uL   Monocytes Relative 7  3 - 12 %   Monocytes Absolute 0.7  0.1 - 1.0 K/uL   Eosinophils Relative 0  0 - 5 %   Eosinophils Absolute 0.0  0.0 - 0.7 K/uL   Basophils Relative 0  0 - 1 %   Basophils Absolute 0.0  0.0 - 0.1 K/uL  COMPREHENSIVE METABOLIC PANEL      Result Value Range   Sodium 135  135 - 145 mEq/L   Potassium 4.6  3.5 - 5.1 mEq/L   Chloride 99  96 - 112 mEq/L   CO2 24  19 - 32 mEq/L   Glucose, Bld 100 (*) 70 - 99 mg/dL    BUN 43 (*) 6 - 23 mg/dL   Creatinine, Ser 8.65 (*) 0.50 - 1.10 mg/dL   Calcium 9.0  8.4 - 78.4 mg/dL   Total Protein 6.3  6.0 - 8.3 g/dL   Albumin 2.8 (*) 3.5 - 5.2 g/dL   AST 96 (*) 0 - 37 U/L   ALT 116 (*) 0 - 35 U/L   Alkaline Phosphatase 150 (*) 39 - 117 U/L   Total Bilirubin 0.6  0.3 - 1.2 mg/dL   GFR calc non Af Amer 22 (*) >90 mL/min   GFR calc Af Amer 25 (*) >90 mL/min  TROPONIN I      Result Value Range   Troponin I <0.30  <0.30 ng/mL  PRO B NATRIURETIC PEPTIDE      Result Value Range   Pro B Natriuretic peptide (BNP) 1519.0 (*) 0 - 450 pg/mL   Dg Chest Portable 1 View  05/16/2012  *RADIOLOGY REPORT*  Clinical Data: 79 year old female with syncope.  PORTABLE CHEST - 1 VIEW  Comparison: 02/07/2012  Findings: Cardiomegaly with left AICD /  pacemaker again noted. There is no evidence of focal airspace disease, pulmonary edema, suspicious pulmonary nodule/mass, pleural effusion, or pneumothorax. No acute bony abnormalities are identified. Degenerative changes within the shoulders again noted.  IMPRESSION: Cardiomegaly without evidence of acute cardiopulmonary disease.   Original Report Authenticated By: Harmon Pier, M.D.     Tried to ambulate pt, unable to take more then 2 steps, starts getting very dizzy. Pacemaker interrogated, no events, pacing normally. Pt not orthostatic. Fluids held due to CHF. Pt continues to be symptomatic. Will call for admission vs obs.    4:20 PM Spoke with Dr. Eloise Harman, will come see pt after he is done in the clinic.    1. Near syncope   2. Renal insufficiency   3. CHF (congestive heart failure)       MDM  Pt with weakness, near syncopal episode today at home. Pt has no chest pain, admits to SOB. Hx advanced CHF. ECG paced. Unable to improve pt's condition in ED with fluids, meclizine. Labs unremarkable. Neurovascularly intact, no vertigo like presentation. Spoke with Dr. Eloise Harman, will admit.         Lottie Mussel, PA-C 05/16/12  1622

## 2012-05-16 NOTE — H&P (Addendum)
[name] is an 79 y.o. female.   Chief Complaint: very dizzy today HPI:  The patient is an 79 year old Caucasian woman who is well-known to me as I serve as her primary care physician. She has multiple medical problems, most notably idiopathic dilated cardiomyopathy with recent small stroke leading to a skilled nursing facility rehabilitation. She was discharged from the skilled nursing facility to home with caretaker help less than 2 weeks ago. Today when a caretaker arrived she got extremely lightheaded when going to the door, and felt as if her legs were going to give out on her. She was able to make it to a chair and sit down, but her lightheadedness persisted. She did not have vertigo or palpitations or chest pain. She has had no change in chronic mild dyspnea on exertion. She has not been eating and drinking as well as usual lately and notes mild difficulty with swallowing solids. Her last hospitalization she was taken off of Coumadin for swallowing test and had an esophageal stricture that was dilated. While off of Coumadin she had a small embolic stroke without significant residual deficits. Currently, she feels comfortable lying at 30 elevation of the head of bed. She's not having any dyspnea or chest discomfort. She has had some occasional cramping in her hands. Her past medical history is otherwise significant for chronic low back pain and spinal stenosis for which she uses a walker to ambulate, gastroesophageal reflux, esophageal dysmotility, osteoarthritis, chronic anticoagulation with Coumadin, paroxysmal atrial fibrillation, ventricular tachycardia with sudden cardiac death leading to defibrillator implantation, diabetes mellitus, type II controlled with diet, and chronic kidney disease, stage III. She has requested a DO NOT RESUSCITATE order.  Past Medical History  Diagnosis Date  . Benign neoplasm of colon   . Diverticulosis of colon (without mention of hemorrhage)   . Reflux  esophagitis   . Gastric polyp     history of  . GERD (gastroesophageal reflux disease)   . Stricture and stenosis of esophagus   . Reactive airway disease   . Anemia, iron deficiency   . CHF (congestive heart failure)   . Restless leg syndrome   . Nonischemic cardiomyopathy     EF 20-25% 01-23-12 / BiV AICD  . Personal history of sudden cardiac death successfully resuscitated     status post cardioverter- defibrillator  . Atrial fibrillation   . Diabetes mellitus   . Osteoarthritis   . Hypertension   . UTI (lower urinary tract infection)   . v45.02   . Chronic kidney disease (CKD), stage III (moderate)   . Hyperlipemia   . GERD with stricture   . Anticoagulated on Coumadin   . Shortness of breath   . Asthma   . Heart murmur   . H/O hiatal hernia      (Not in a hospital admission)  ADDITIONAL HOME MEDICATIONS: See list of home medications  PHYSICIANS INVOLVED IN CARE: Berton Mount, Yancey Flemings, Reggy Eye  Past Surgical History  Procedure Laterality Date  . Cardiac defibrillator placement  1999, 2001, 3.30.2012    [facility], Removal of previously implanted BiV ICD followed by insertion of BiV pacemaker with pacemaker pocket revision. without immediate procedural complications.   . Eye surgery  2002    cataract   . Total hip arthroplasty  2004    right  . Insert / replace / remove pacemaker    . Joint replacement    . Esophagogastroduodenoscopy (egd) with esophageal dilation  01/31/2012  Procedure: ESOPHAGOGASTRODUODENOSCOPY (EGD) WITH ESOPHAGEAL DILATION;  Surgeon: Rachael Fee, MD;  Location: [facility] ENDOSCOPY;  Service: Endoscopy;  Laterality: N/A;  . Esophagogastroduodenoscopy  02/01/2012    Procedure: ESOPHAGOGASTRODUODENOSCOPY (EGD);  Surgeon: Rachael Fee, MD;  Location: [facility] ENDOSCOPY;  Service: Endoscopy;  Laterality: N/A;    No family history on file.   Social History:  reports that she has never smoked. She has never used  smokeless tobacco. She reports that she drinks about 3.6 ounces of alcohol per week. She reports that she does not use illicit drugs.She is a widow since her husband died from a CNS bleed on coumadin in 2009, has 3 children, no history of tobacco or alcohol abuse.  Allergies:  Allergies  Allergen Reactions  . Iodinated Diagnostic Agents     Unknown     ROS: arthritis, asthma, diabetes, heart murmur, heart palpitation, kidney disease, shortness of breath, stroke and thyroid disease  PHYSICAL EXAM: Blood pressure 116/59, pulse 70, temperature 98.7 F (37.1 C), temperature source Oral, resp. rate 14, height 5\' 2"  (1.575 m), weight 68.493 kg (151 lb), last menstrual period 01/10/2012, SpO2 97.00%. In general, she is a mildly overweight white woman who was in no apparent distress while lying at 30 elevation head of bed. HEENT exam was within normal limits, neck was without jugular venous distention or carotid bruit, chest was clear to auscultation, heart had a regular rate and rhythm with a systolic ejection murmur of grade 2/6 cholesterol border, abdomen had normal bowel sounds no hepatosplenomegaly , extremities were without cyanosis, clubbing, or edema. She was alert and well oriented with normal affect and could move all extremities well. Gait was not assessed.  Results for orders placed during the hospital encounter of 05/16/12 (from the past 48 hour(s))  CBC WITH DIFFERENTIAL     Status: Abnormal   Collection Time    05/16/12 11:55 AM      Result Value Range   WBC 10.4  4.0 - 10.5 K/uL   RBC 3.70 (*) 3.87 - 5.11 MIL/uL   Hemoglobin 12.2  12.0 - 15.0 g/dL   HCT 16.1 (*) 09.6 - 04.5 %   MCV 94.9  78.0 - 100.0 fL   MCH 33.0  26.0 - 34.0 pg   MCHC 34.8  30.0 - 36.0 g/dL   RDW 40.9  81.1 - 91.4 %   Platelets 225  150 - 400 K/uL   Neutrophils Relative 86 (*) 43 - 77 %   Neutro Abs 8.9 (*) 1.7 - 7.7 K/uL   Lymphocytes Relative 7 (*) 12 - 46 %   Lymphs Abs 0.7  0.7 - 4.0 K/uL    Monocytes Relative 7  3 - 12 %   Monocytes Absolute 0.7  0.1 - 1.0 K/uL   Eosinophils Relative 0  0 - 5 %   Eosinophils Absolute 0.0  0.0 - 0.7 K/uL   Basophils Relative 0  0 - 1 %   Basophils Absolute 0.0  0.0 - 0.1 K/uL  COMPREHENSIVE METABOLIC PANEL     Status: Abnormal   Collection Time    05/16/12 11:55 AM      Result Value Range   Sodium 135  135 - 145 mEq/L   Potassium 4.6  3.5 - 5.1 mEq/L   Comment: HEMOLYSIS AT THIS LEVEL MAY AFFECT RESULT   Chloride 99  96 - 112 mEq/L   CO2 24  19 - 32 mEq/L   Glucose, Bld 100 (*) 70 - 99 mg/dL  BUN 43 (*) 6 - 23 mg/dL   Creatinine, Ser 1.61 (*) 0.50 - 1.10 mg/dL   Calcium 9.0  8.4 - 09.6 mg/dL   Total Protein 6.3  6.0 - 8.3 g/dL   Albumin 2.8 (*) 3.5 - 5.2 g/dL   AST 96 (*) 0 - 37 U/L   ALT 116 (*) 0 - 35 U/L   Alkaline Phosphatase 150 (*) 39 - 117 U/L   Total Bilirubin 0.6  0.3 - 1.2 mg/dL   GFR calc non Af Amer 22 (*) >90 mL/min   GFR calc Af Amer 25 (*) >90 mL/min   Comment:            The eGFR has been calculated     using the CKD EPI equation.     This calculation has not been     validated in all clinical     situations.     eGFR's persistently     <90 mL/min signify     possible Chronic Kidney Disease.  TROPONIN I     Status: None   Collection Time    05/16/12 11:55 AM      Result Value Range   Troponin I <0.30  <0.30 ng/mL   Comment:            Due to the release kinetics of cTnI,     a negative result within the first hours     of the onset of symptoms does not rule out     myocardial infarction with certainty.     If myocardial infarction is still suspected,     repeat the test at appropriate intervals.  PRO B NATRIURETIC PEPTIDE     Status: Abnormal   Collection Time    05/16/12 11:55 AM      Result Value Range   Pro B Natriuretic peptide (BNP) 1519.0 (*) 0 - 450 pg/mL  URINALYSIS, ROUTINE W REFLEX MICROSCOPIC     Status: None   Collection Time    05/16/12  1:47 PM      Result Value Range   Color, Urine  YELLOW  YELLOW   APPearance CLEAR  CLEAR   Specific Gravity, Urine 1.010  1.005 - 1.030   pH 6.5  5.0 - 8.0   Glucose, UA NEGATIVE  NEGATIVE mg/dL   Hgb urine dipstick NEGATIVE  NEGATIVE   Bilirubin Urine NEGATIVE  NEGATIVE   Ketones, ur NEGATIVE  NEGATIVE mg/dL   Protein, ur NEGATIVE  NEGATIVE mg/dL   Urobilinogen, UA 1.0  0.0 - 1.0 mg/dL   Nitrite NEGATIVE  NEGATIVE   Leukocytes, UA NEGATIVE  NEGATIVE   Comment: MICROSCOPIC NOT DONE ON URINES WITH NEGATIVE PROTEIN, BLOOD, LEUKOCYTES, NITRITE, OR GLUCOSE <1000 mg/dL.  PROTIME-INR     Status: Abnormal   Collection Time    05/16/12  4:12 PM      Result Value Range   Prothrombin Time 20.5 (*) 11.6 - 15.2 seconds   INR 1.83 (*) 0.00 - 1.49  POCT I-STAT TROPONIN I     Status: None   Collection Time    05/16/12  4:47 PM      Result Value Range   Troponin i, poc 0.00  0.00 - 0.08 ng/mL   Comment 3            Comment: Due to the release kinetics of cTnI,     a negative result within the first hours     of the onset of symptoms does not rule  out     myocardial infarction with certainty.     If myocardial infarction is still suspected,     repeat the test at appropriate intervals.   Dg Chest Portable 1 View  05/16/2012  *RADIOLOGY REPORT*  Clinical Data: 80 year old female with syncope.  PORTABLE CHEST - 1 VIEW  Comparison: 02/07/2012  Findings: Cardiomegaly with left AICD / pacemaker again noted. There is no evidence of focal airspace disease, pulmonary edema, suspicious pulmonary nodule/mass, pleural effusion, or pneumothorax. No acute bony abnormalities are identified. Degenerative changes within the shoulders again noted.  IMPRESSION: Cardiomegaly without evidence of acute cardiopulmonary disease.   Original Report Authenticated By: Harmon Pier, M.D.      Assessment/Plan #1 Near Syncope: Most likely from orthostatic hypotension given that she has a functioning pacemaker and did not have significant palpitations associated with her  extreme lightheadedness today. She has not been eating and drinking as well as usual lately, perhaps from esophageal dysmotility versus a recurrent stricture. We will give her gentle IV fluids tonight and check her orthostatic vital signs in the morning. We will continue EKG telemetry to see if there is a significant arrhythmia causing her lightheadedness. We will also have a physical therapist evaluate the safety of her gait. We will also try 10-20 knee high compression hose to see if this helps prevent orthostatic hypotension. I will discuss her situation with her daughter, Annice Pih, this evening. #2 Dysphagia: Most likely from esophageal dysmotility and less likely from her recurrent stricture. We will check a barium swallow study tomorrow. #3 Chronic Kidney Disease, stage III: Stable on current medications. #4 Idiopathic Cardiomyopathy: Stable and her pro BNP level today is a reflection of her dilated cardiomyopathy. She is not clinically in acute congestive heart failure. #5 Anticoagulation: Her INR level is slightly subtherapeutic and we will request pharmacist assistance with dosing of Coumadin.  Point of Contact:  Samantha Crimes (daughter) at cell [phone]  Garlan Fillers 05/16/2012, 7:16 PM

## 2012-05-16 NOTE — ED Provider Notes (Signed)
Medical screening examination/treatment/procedure(s) were conducted as a shared visit with non-physician practitioner(s) and myself.  I personally evaluated the patient during the encounter.  Patient had about a 30 minute spell PTA of lightheadedness with generalized weakness with presyncope without syncope, without vertigo, without headache, without any change in speech vision swallowing or understanding or lateralizing weakness numbness or incoordination. She also had no chest pain palpitations or shortness of breath.  Hurman Horn, MD 05/16/12 2120

## 2012-05-16 NOTE — ED Notes (Signed)
Pt here per [facility] with complaint of near syncope.  Pt was at home walking to her door when she had an episode that she describes as "I almost passed out".  Pt advises she walked back to her chair and her caretaker called 911.  Pt awake and alert with [facility] arrival.  IV started, CBG 88.

## 2012-05-16 NOTE — ED Notes (Signed)
Pt took 2 or 3 steps and stated she "can'tt do it". Pt advises she was "swimmy headed".  Pt assisted back into bed without complications.

## 2012-05-16 NOTE — ED Notes (Signed)
[phone] Annice Pih (Daughter) please call when patient is admitted and has a room.

## 2012-05-16 NOTE — Telephone Encounter (Signed)
This patient is currently admitted

## 2012-05-16 NOTE — Telephone Encounter (Signed)
Labs from 05/08/12:   BUN 49 Creatinine 2.1 K 4.5 ALP 136 AST 122 ALT 133 GGT 132  Renal fxn stable LFTs higher  I reviewed with Dr. Sherryl Manges. Recommendation is to stop Amiodarone for now. She needs follow up with PCP with repeat LFTs in about 4 weeks.  Tereso Newcomer, PA-C  4:55 PM 05/16/2012

## 2012-05-16 NOTE — Progress Notes (Signed)
ANTICOAGULATION CONSULT NOTE - Initial Consult  Pharmacy Consult for warfarin Indication: atrial fibrillation  Allergies  Allergen Reactions  . Iodinated Diagnostic Agents     Unknown    Patient Measurements: Height: 5\' 2"  (157.5 cm) Weight: 151 lb (68.493 kg) IBW/kg (Calculated) : 50.1  Vital Signs: Temp: 98.7 F (37.1 C) (03/06 1038) Temp src: Oral (03/06 1038) BP: 116/59 mmHg (03/06 1345) Pulse Rate: 70 (03/06 1345)  Labs:  Recent Labs  05/16/12 1155 05/16/12 1612  HGB 12.2  --   HCT 35.1*  --   PLT 225  --   LABPROT  --  20.5*  INR  --  1.83*  CREATININE 2.01*  --   TROPONINI <0.30  --     Estimated Creatinine Clearance: 19.3 ml/min (by C-G formula based on Cr of 2.01).   Medical History: Past Medical History  Diagnosis Date  . Benign neoplasm of colon   . Diverticulosis of colon (without mention of hemorrhage)   . Reflux esophagitis   . Gastric polyp     history of  . GERD (gastroesophageal reflux disease)   . Stricture and stenosis of esophagus   . Reactive airway disease   . Anemia, iron deficiency   . CHF (congestive heart failure)   . Restless leg syndrome   . Nonischemic cardiomyopathy     EF 20-25% 01-23-12 / BiV AICD  . Personal history of sudden cardiac death successfully resuscitated     status post cardioverter- defibrillator  . Atrial fibrillation   . Diabetes mellitus   . Osteoarthritis   . Hypertension   . UTI (lower urinary tract infection)   . v45.02   . Chronic kidney disease (CKD), stage III (moderate)   . Hyperlipemia   . GERD with stricture   . Anticoagulated on Coumadin   . Shortness of breath   . Asthma   . Heart murmur   . H/O hiatal hernia    Assessment: 36 yof presented to the ED with dizziness. She is on chronic coumadin for afib. Her INR is slightly subtherapeutic at 1.83. Her CBC is WNL.   Goal of Therapy:  INR 2-3   Plan:  1. Warfarin 2mg  PO x 1 tonight 2. Daily INR  Rumbarger, Drake Leach 05/16/2012,7:48 PM

## 2012-05-16 NOTE — ED Notes (Signed)
.   Patient was able to stand with the [facility].  Patient took a few little step then stated she felt like she was going to pass out.  Helped Patient back to bed.

## 2012-05-16 NOTE — ED Provider Notes (Signed)
Medical screening examination/treatment/procedure(s) were conducted as a shared visit with non-physician practitioner(s) and myself.  I personally evaluated the patient during the encounter  Hurman Horn, MD 05/16/12 2150

## 2012-05-17 ENCOUNTER — Inpatient Hospital Stay (HOSPITAL_COMMUNITY): Payer: Medicare Other

## 2012-05-17 LAB — COMPREHENSIVE METABOLIC PANEL
AST: 89 U/L — ABNORMAL HIGH (ref 0–37)
BUN: 39 mg/dL — ABNORMAL HIGH (ref 6–23)
CO2: 26 mEq/L (ref 19–32)
Calcium: 9.2 mg/dL (ref 8.4–10.5)
Chloride: 100 mEq/L (ref 96–112)
Creatinine, Ser: 1.91 mg/dL — ABNORMAL HIGH (ref 0.50–1.10)
GFR calc Af Amer: 27 mL/min — ABNORMAL LOW (ref >90)
GFR calc non Af Amer: 23 mL/min — ABNORMAL LOW (ref >90)
Glucose, Bld: 88 mg/dL (ref 70–99)
Total Bilirubin: 0.6 mg/dL (ref 0.3–1.2)

## 2012-05-17 LAB — PRO B NATRIURETIC PEPTIDE: Pro B Natriuretic peptide (BNP): 3145 pg/mL — ABNORMAL HIGH (ref 0–450)

## 2012-05-17 LAB — GLUCOSE, CAPILLARY
Glucose-Capillary: 89 mg/dL (ref 70–99)
Glucose-Capillary: 87 mg/dL (ref 70–99)

## 2012-05-17 MED ORDER — WARFARIN SODIUM 3 MG PO TABS
3.0000 mg | ORAL_TABLET | Freq: Once | ORAL | Status: AC
  Administered 2012-05-17: 3 mg via ORAL
  Filled 2012-05-17: qty 1

## 2012-05-17 MED ORDER — MEGESTROL ACETATE 400 MG/10ML PO SUSP
400.0000 mg | Freq: Two times a day (BID) | ORAL | Status: DC
  Administered 2012-05-17 (×2): 400 mg via ORAL
  Filled 2012-05-17 (×4): qty 10

## 2012-05-17 MED ORDER — CITALOPRAM HYDROBROMIDE 10 MG PO TABS
10.0000 mg | ORAL_TABLET | Freq: Every day | ORAL | Status: DC
  Administered 2012-05-17 – 2012-05-21 (×5): 10 mg via ORAL
  Filled 2012-05-17 (×5): qty 1

## 2012-05-17 NOTE — Evaluation (Signed)
Physical Therapy Evaluation Patient Details Name: [name] MRN: [id number] DOB: 1926/05/30 Today's Date: 05/17/2012 Time: 4098-1191 PT Time Calculation (min): 31 min  PT Assessment / Plan / Recommendation Clinical Impression  79 yo female recently d/c'd from SNF for rehab for 3 months admitted with pre-syncopal episode.  She has arranged personal caregivers at home from 8am-noon and 4 pm-8pm and so would like to return back to that arrangement with HHPT.  Pt also mentioned she will talk to her son about ALF in near future. [facility] if patient has some sort of life alert system as well. Will follow while she is an impatient    PT Assessment  Patient needs continued PT services    Follow Up Recommendations  Home health PT    Does the patient have the potential to tolerate intense rehabilitation      Barriers to Discharge        Equipment Recommendations  None recommended by PT    Recommendations for Other Services OT consult   Frequency Min 3X/week    Precautions / Restrictions     Pertinent Vitals/Pain Pt feels breathless with activity O2 sat readings while active are unreliable      Mobility  Bed Mobility Bed Mobility: Rolling Left;Left Sidelying to Sit;Supine to Sit;Sit to Supine Rolling Left: 5: Supervision;With rail Left Sidelying to Sit: 5: Supervision Supine to Sit: 5: Supervision Details for Bed Mobility Assistance: pt needs some assist moving on hospital mattress Transfers Transfers: Sit to Stand;Stand to Sit Sit to Stand: 5: Supervision Stand to Sit: 5: Supervision Details for Transfer Assistance: verbal cues to push off with UE's Ambulation/Gait Ambulation/Gait Assistance: 5: Supervision Ambulation Distance (Feet): 80 Feet (40 x2) Assistive device: Rolling walker Ambulation/Gait Assistance Details: pt flexed over RW, but appears to be functional with it Gait Pattern: Trunk flexed;Decreased step length - left;Decreased step length - right;Step-to  pattern Gait velocity: pt reports she cannot stand very long because of back pain so she appears that she is "hurrying" with her walking General Gait Details: pt appears deconditioned, but she is able to ambulate with RW.  She c/o feeling short of breath most of the time.  O2 sats are unreliable at fingertips (?? circulation?? ) but at rest readings are 99%  Stairs: No Wheelchair Mobility Wheelchair Mobility: No    Exercises     PT Diagnosis: Difficulty walking;Generalized weakness  PT Problem List: Decreased strength;Decreased activity tolerance;Decreased mobility PT Treatment Interventions: Gait training;Functional mobility training;Therapeutic exercise;Therapeutic activities;Balance training;Patient/family education   PT Goals Acute Rehab PT Goals PT Goal Formulation: With patient Time For Goal Achievement: 05/31/12 Potential to Achieve Goals: Good Pt will go Supine/Side to Sit: Independently PT Goal: Supine/Side to Sit - Progress: Goal set today Pt will go Sit to Supine/Side: Independently PT Goal: Sit to Supine/Side - Progress: Goal set today Pt will go Sit to Stand: Independently PT Goal: Sit to Stand - Progress: Goal set today Pt will go Stand to Sit: Independently PT Goal: Stand to Sit - Progress: Goal set today Pt will Ambulate: 51 - 150 feet;with modified independence;with least restrictive assistive device PT Goal: Ambulate - Progress: Goal set today Pt will Perform Home Exercise Program: Independently PT Goal: Perform Home Exercise Program - Progress: Goal set today  Visit Information  Last PT Received On: 05/17/12 Assistance Needed: +1    Subjective Data  Subjective: "I'm just so tired" Patient Stated Goal: to get dressed on my own   Prior Functioning  Home Living Lives  With: Alone Available Help at Discharge: Personal care attendant (8 hours per day) Type of Home: Other (Comment) (townhouse) Home Access: Ramped entrance Home Layout: One level Home Adaptive  Equipment: Dan Humphreys - four wheeled;Wheelchair - manual;Walker - rolling Additional Comments: can't use the wheelchair in the house Prior Function Level of Independence: Independent with assistive device(s) Comments: pt states she had been in a nursing home for 3 months and had only been home a week.  She said it was an adjustment to get back to her home.  She does wnat to return to home rather than go back to nursing home Communication Communication: No difficulties    Cognition  Cognition Overall Cognitive Status: Appears within functional limits for tasks assessed/performed Arousal/Alertness: Awake/alert Orientation Level: Appears intact for tasks assessed Behavior During Session: [facility] for tasks performed    Extremity/Trunk Assessment Right Lower Extremity Assessment RLE ROM/Strength/Tone: [facility] for tasks assessed Left Lower Extremity Assessment LLE ROM/Strength/Tone: [facility] for tasks assessed Trunk Assessment Trunk Assessment: Other exceptions;Kyphotic Trunk Exceptions: generalized muscle atrophy   Balance Balance Balance Assessed: Yes Static Sitting Balance Static Sitting - Balance Support: No upper extremity supported Static Sitting - Level of Assistance: 7: Independent Static Standing Balance Static Standing - Balance Support: Bilateral upper extremity supported;During functional activity Static Standing - Level of Assistance: 6: Modified independent (Device/Increase time)  End of Session PT - End of Session Equipment Utilized During Treatment: Gait belt Activity Tolerance: Patient limited by fatigue Patient left: in bed  GP   Rosey Bath K. [place], [place] [phone] 05/17/2012, 2:33 PM

## 2012-05-17 NOTE — Progress Notes (Signed)
ANTICOAGULATION CONSULT NOTE - Initial Consult  Pharmacy Consult for warfarin Indication: atrial fibrillation  Allergies  Allergen Reactions  . Iodinated Diagnostic Agents     Unknown    Patient Measurements: Height: 5\' 2"  (157.5 cm) Weight: 152 lb 14.4 oz (69.355 kg) IBW/kg (Calculated) : 50.1  Vital Signs: Temp: 98.1 F (36.7 C) (03/07 0500) Temp src: Oral (03/07 0500) BP: 121/75 mmHg (03/07 0500) Pulse Rate: 70 (03/07 0500)  Labs:  Recent Labs  05/16/12 1155 05/16/12 1612 05/17/12 0655  HGB 12.2  --   --   HCT 35.1*  --   --   PLT 225  --   --   LABPROT  --  20.5* 20.0*  INR  --  1.83* 1.77*  CREATININE 2.01*  --  1.91*  TROPONINI <0.30  --   --     Estimated Creatinine Clearance: 20.4 ml/min (by C-G formula based on Cr of 1.91).   Medical History: Past Medical History  Diagnosis Date  . Benign neoplasm of colon   . Diverticulosis of colon (without mention of hemorrhage)   . Reflux esophagitis   . Gastric polyp     history of  . GERD (gastroesophageal reflux disease)   . Stricture and stenosis of esophagus   . Reactive airway disease   . Anemia, iron deficiency   . CHF (congestive heart failure)   . Restless leg syndrome   . Nonischemic cardiomyopathy     EF 20-25% 01-23-12 / BiV AICD  . Personal history of sudden cardiac death successfully resuscitated     status post cardioverter- defibrillator  . Atrial fibrillation   . Diabetes mellitus   . Osteoarthritis   . Hypertension   . UTI (lower urinary tract infection)   . v45.02   . Chronic kidney disease (CKD), stage III (moderate)   . Hyperlipemia   . GERD with stricture   . Anticoagulated on Coumadin   . Shortness of breath   . Asthma   . Heart murmur   . H/O hiatal hernia    Assessment: [name] on chronic coumadin for afib. Her INR is slightly subtherapeutic at 1.77 today.   Home dose: 1mg  on Thurs, 2mg  all other days  Goal of Therapy:  INR 2-3   Plan:  1. Warfarin 3mg  PO x 1  tonight 2. Daily INR  Bayard Hugger, PharmD, BCPS  Clinical Pharmacist  Pager: [phone]   05/17/2012,10:58 AM

## 2012-05-17 NOTE — Progress Notes (Signed)
Subjective: Feels tired this am, no dyspnea at rest or palpitations. She is to have her barium swallow test today.  Objective: Vital signs in last 24 hours: Temp:  [98.1 F (36.7 C)-98.7 F (37.1 C)] 98.1 F (36.7 C) (03/07 0500) Pulse Rate:  [69-85] 70 (03/07 0500) Resp:  [14-18] 16 (03/07 0500) BP: (102-153)/(48-109) 121/75 mmHg (03/07 0500) SpO2:  [94 %-99 %] 98 % (03/07 0500) Weight:  [68.493 kg (151 lb)-69.4 kg (153 lb)] 69.355 kg (152 lb 14.4 oz) (03/07 0500) Weight change:    Intake/Output from previous day: 03/06 0701 - 03/07 0700 In: -  Out: 120 [Urine:120]   General appearance: alert, cooperative and no distress Resp: clear to auscultation bilaterally Cardio: regular rate and rhythm and with a 2/6 systolic ejection murmur GI: soft, non-tender; bowel sounds normal; no masses,  no organomegaly Extremities: extremities normal, atraumatic, no cyanosis or edema  Lab Results:  Recent Labs  05/16/12 1155  WBC 10.4  HGB 12.2  HCT 35.1*  PLT 225   BMET  Recent Labs  05/16/12 1155 05/17/12 0655  NA 135 136  K 4.6 4.2  CL 99 100  CO2 24 26  GLUCOSE 100* 88  BUN 43* 39*  CREATININE 2.01* 1.91*  CALCIUM 9.0 9.2   CMET CMP     Component Value Date/Time   NA 136 05/17/2012 0655   K 4.2 05/17/2012 0655   CL 100 05/17/2012 0655   CO2 26 05/17/2012 0655   GLUCOSE 88 05/17/2012 0655   BUN 39* 05/17/2012 0655   CREATININE 1.91* 05/17/2012 0655   CALCIUM 9.2 05/17/2012 0655   PROT 6.5 05/17/2012 0655   ALBUMIN 2.9* 05/17/2012 0655   AST 89* 05/17/2012 0655   ALT 114* 05/17/2012 0655   ALKPHOS 149* 05/17/2012 0655   BILITOT 0.6 05/17/2012 0655   GFRNONAA 23* 05/17/2012 0655   GFRAA 27* 05/17/2012 0655    CBG (last 3)   Recent Labs  05/16/12 2121 05/17/12 0748  GLUCAP 73 89    INR RESULTS:   Lab Results  Component Value Date   INR 1.77* 05/17/2012   INR 1.83* 05/16/2012   INR 3.03* 02/12/2012     Studies/Results: Dg Chest Portable 1 View  05/16/2012  *RADIOLOGY REPORT*   Clinical Data: 79 year old female with syncope.  PORTABLE CHEST - 1 VIEW  Comparison: 02/07/2012  Findings: Cardiomegaly with left AICD / pacemaker again noted. There is no evidence of focal airspace disease, pulmonary edema, suspicious pulmonary nodule/mass, pleural effusion, or pneumothorax. No acute bony abnormalities are identified. Degenerative changes within the shoulders again noted.  IMPRESSION: Cardiomegaly without evidence of acute cardiopulmonary disease.   Original Report Authenticated By: Harmon Pier, M.D.     Medications: I have reviewed the patient's current medications.  Assessment/Plan: #1 Near Syncope: stable with no serious arrhythmia noted on telemetry. Likely due to mild intravascular volume depletion. #2 Dysphagia: due to dysmotility versus stricture. Await results of barium swallow test today.  #3 Protein calorie malnutrition: moderate as indicated by her low albumin level. Could be due to cardiomyopathy. Will try adding megace to her regimen.  #4 Depression: mild (although she denies this). Will switch from remeron to celexa 10 mg daily.   LOS: 1 day   PATERSON,DANIEL G 05/17/2012, 9:41 AM

## 2012-05-18 DIAGNOSIS — R1310 Dysphagia, unspecified: Secondary | ICD-10-CM

## 2012-05-18 DIAGNOSIS — R630 Anorexia: Secondary | ICD-10-CM | POA: Diagnosis present

## 2012-05-18 DIAGNOSIS — R634 Abnormal weight loss: Secondary | ICD-10-CM | POA: Diagnosis present

## 2012-05-18 DIAGNOSIS — K224 Dyskinesia of esophagus: Secondary | ICD-10-CM

## 2012-05-18 DIAGNOSIS — E46 Unspecified protein-calorie malnutrition: Secondary | ICD-10-CM

## 2012-05-18 DIAGNOSIS — R7402 Elevation of levels of lactic acid dehydrogenase (LDH): Secondary | ICD-10-CM

## 2012-05-18 LAB — PROTIME-INR: INR: 2.1 — ABNORMAL HIGH (ref 0.00–1.49)

## 2012-05-18 MED ORDER — WARFARIN SODIUM 2 MG PO TABS
2.0000 mg | ORAL_TABLET | Freq: Once | ORAL | Status: DC
  Filled 2012-05-18: qty 1

## 2012-05-18 MED ORDER — FUROSEMIDE 8 MG/ML PO SOLN
40.0000 mg | Freq: Every day | ORAL | Status: DC
  Administered 2012-05-18 – 2012-05-21 (×4): 40 mg via ORAL
  Filled 2012-05-18 (×5): qty 5

## 2012-05-18 NOTE — Progress Notes (Signed)
Subjective: Patient continues to feel somewhat fatigued but denies overt chest pain, palpitations, presyncope, does have some episodic issues with nausea, and dysphasia with no overt vomiting. Caloric intake and fluids have been somewhat decreased by her report. Denies any lower extremity edema.  Objective: Vital signs in last 24 hours: Temp:  [97.4 F (36.3 C)-98.1 F (36.7 C)] 97.4 F (36.3 C) (03/08 0500) Pulse Rate:  [70] 70 (03/08 0500) Resp:  [18] 18 (03/08 0500) BP: (96-110)/(60-71) 107/66 mmHg (03/08 0500) SpO2:  [95 %-99 %] 95 % (03/08 0500) Weight:  [68.811 kg (151 lb 11.2 oz)] 68.811 kg (151 lb 11.2 oz) (03/08 0500) Weight change: 0.318 kg (11.2 oz) Last BM Date: 05/16/12  CBG (last 3)   Recent Labs  05/17/12 1700 05/17/12 2059 05/18/12 0744  GLUCAP 87 97 80    Intake/Output from previous day: 03/07 0701 - 03/08 0700 In: 663 [P.O.:660; I.V.:3] Out: -  Intake/Output this shift:   General appearance-alert cooperative, answering all questions appropriately, no respiratory distress Neck supple, no cervical lymphadenopathy Lungs clear to auscultation bilaterally Cardiovascular reveals regular rate and rhythm with a systolic ejection murmur Abdomen soft, nontender, nondistended, bowel sounds present No edema, cyanosis, pedal pulses intact  Lab Results:  Recent Labs  05/16/12 1155 05/17/12 0655  NA 135 136  K 4.6 4.2  CL 99 100  CO2 24 26  GLUCOSE 100* 88  BUN 43* 39*  CREATININE 2.01* 1.91*  CALCIUM 9.0 9.2  MG  --  2.1    Recent Labs  05/16/12 1155 05/17/12 0655  AST 96* 89*  ALT 116* 114*  ALKPHOS 150* 149*  BILITOT 0.6 0.6  PROT 6.3 6.5  ALBUMIN 2.8* 2.9*    Recent Labs  05/16/12 1155  WBC 10.4  NEUTROABS 8.9*  HGB 12.2  HCT 35.1*  MCV 94.9  PLT 225    Recent Labs  05/16/12 1155  TROPONINI <0.30   No results found for this basename: TSH, T4TOTAL, FREET3, T3FREE, THYROIDAB,  in the last 72 hours No results found for this  basename: VITAMINB12, FOLATE, FERRITIN, TIBC, IRON, RETICCTPCT,  in the last 72 hours   PT/INR 2.1 this morning Studies/Results: Dg Esophagus  05/17/2012  *RADIOLOGY REPORT*  Clinical Data: Difficulty swallowing.  History of stricture and dilatations.  ESOPHOGRAM/BARIUM SWALLOW  Technique:  Single contrast examination was performed using thin barium.  Fluoroscopy time:  3.12 minutes.  Comparison:  None.  Findings:  Severe esophageal dysmotility with a corkscrew appearance and extensive esophageal spasm with significant esophageal stasis.  There appears to be a tight stricture involving the distal esophagus but very little contrast went through and into the stomach despite elevation of the patient and using iced barium.  IMPRESSION:  1.  Severe esophageal dysmotility with diffuse esophageal spasm and a corkscrew appearance.  Significant esophageal stasis. 2.  Suspect a tight distal esophageal stricture but very little barium went into the stomach.   Original Report Authenticated By: Rudie Meyer, M.D.    Dg Chest Portable 1 View  05/16/2012  *RADIOLOGY REPORT*  Clinical Data: 79 year old female with syncope.  PORTABLE CHEST - 1 VIEW  Comparison: 02/07/2012  Findings: Cardiomegaly with left AICD / pacemaker again noted. There is no evidence of focal airspace disease, pulmonary edema, suspicious pulmonary nodule/mass, pleural effusion, or pneumothorax. No acute bony abnormalities are identified. Degenerative changes within the shoulders again noted.  IMPRESSION: Cardiomegaly without evidence of acute cardiopulmonary disease.   Original Report Authenticated By: Harmon Pier, M.D.  Medications: Scheduled: . amiodarone  200 mg Oral Daily  . citalopram  10 mg Oral Daily  . furosemide  40 mg Oral Daily  . irbesartan  75 mg Oral Daily  . levothyroxine  100 mcg Oral QAC breakfast  . loratadine  10 mg Oral Daily  . montelukast  10 mg Oral QHS  . olopatadine  1 drop Both Eyes BID  . omega-3 acid ethyl  esters  1 g Oral Daily  . pantoprazole  40 mg Oral Daily  . sodium chloride  3 mL Intravenous Q12H  . traMADol  50 mg Oral Daily  . [START ON 05/21/2012] Vitamin D (Ergocalciferol)  50,000 Units Oral Q7 days  . Warfarin - Pharmacist Dosing Inpatient   Does not apply q1800   Continuous: . 0.9 % NaCl with KCl 20 mEq / L 20 mL/hr at 05/17/12 1914    Assessment/Plan: Principal Problem:   Syncope, near-telemetry stable, patient asymptomatic at this time, question relationship to volume depletion as a result of GI issues Active Problems:   CARDIOMYOPATHY, PRIMARY-clinically comp and   Paroxysmal ventricular tachycardia-has AICD in place   Atrial flutter/Fibrillation-normal rhythm with pacemaker in place, hemodynamically stable, on Coumadin, see below.   Chronic systolic heart failure-clinically compensated   ESOPHAGEAL STRICTURE-with dysmotility, barium swallow reviewed from yesterday, will plan on GI consultation, discussion with Dr. Leone Payor indicates that he wishes to have PT-INR less than 2.0 prior to any planned procedure, possibly on Monday. See below.   Anticoagulated on Coumadin-patient does have a history of a small embolic stroke when off of Coumadin for short period of time for previous GI procedure, we'll bridge with heparin, and allow Coumadin to drop down below 2.0 with potential EGD per Dr. Leone Payor after his evaluation   Unspecified protein-calorie malnutrition-presumably multifactorial as a result of cardiovascular issues as well as GI issues.    LOS: 2 days   AVVA,RAVISANKAR R 05/18/2012, 10:31 AM

## 2012-05-18 NOTE — Progress Notes (Signed)
INITIAL NUTRITION ASSESSMENT  DOCUMENTATION CODES Per approved criteria  -Severe malnutrition in the context of chronic illness   INTERVENTION: 1.  General healthful diet; discussed intake and wt goals with pt.  Encouraged intake of food and beverages.  Discussed ways to encourage appetite. 2.  Supplements; Ensure Complete once daily 3.  Nutrition-related medication; consider bowel regimen for pt who reports requiring softeners to induce bowel movements q 3 days.   NUTRITION DIAGNOSIS: Unintended wt loss related to poor appetite, poor intake as evidenced by pt report, wt trend.   Monitor:  1.  Food/Beverage; continued intake >50% of meals and supplements to meet >/=90% estimated needs 2.  Wt/wt change; deter further loss  Reason for Assessment: consult  79 y.o. female  Admitting Dx: Syncope, near  ASSESSMENT: Pt admitted with episode of dizziness.  Pt evaluated by GI and found to have esophageal dysmotility, anorexia, wt loss, and dehydration.  Pt with recent esophageal dilation in Nov 2013 which did not completely resolve her trouble swallowing.  GI pursuing possibility of spasms. Pt reports trouble with solids and liquids.  Pt reports ongoing wt loss since July 2013.  Pt has lost 17% of her usual wt in 8 months.  She has not reached a point of wt stability since wt loss started.  She reports anorexia which prevents her from eating.   RD notes pt has spent the last 3 months at SNF rehab due to TIA in November.  Pt with some residual right-sided weakness.   With frequent constipation requiring stool softeners to induce bowel movements q 3 days. Pt states she has not been able to stop wt loss.  She has tried drinking supplements, but has only been drinking "a couple per week."     Nutrition Focused Physical Exam:  Subcutaneous Fat:  Orbital Region: wnl Upper Arm Region: moderate Thoracic and Lumbar Region: wnl  Muscle:  Temple Region: moderate Clavicle Bone Region:  wnl Clavicle and Acromion Bone Region: wnl Scapular Bone Region: n/a Dorsal Hand: wnl Patellar Region: n/a  Anterior Thigh Region: n/a Posterior Calf Region: wnl  Edema: none present  Discussed nutrition status and wt loss with pt.  Pt is able to fully engage in conversation and answers all questions appropriately, however has some difficulty staying awake.  Discussed ways to encourage appetite.  Pt states she was able to eat most of her meals today.  She denies identifying any factors which have improved her appetite saying "I just like it, I guess." Encourage OOB as able.   Encouraged bowel regimen. Discussed goals r/t to intake at meals and use of supplements.     Pt was recently assessed by clinical nutrition staff in Nov 2013 and dx with severe malnutrition of chronic illness.  This is ongoing due to continued poor PO intake reduced from 3 meals and frequent snacks to 1-2 small meals per day per pt report, as well as ongoing wt loss.   Height: Ht Readings from Last 1 Encounters:  05/16/12 5\' 2"  (1.575 m)    Weight: Wt Readings from Last 1 Encounters:  05/18/12 151 lb 11.2 oz (68.811 kg)    Ideal Body Weight: 110 lbs  % Ideal Body Weight: 137%  Wt Readings from Last 10 Encounters:  05/18/12 151 lb 11.2 oz (68.811 kg)  05/02/12 153 lb 1.9 oz (69.455 kg)  04/18/12 158 lb (71.668 kg)  02/26/12 162 lb (73.483 kg)  02/21/12 162 lb (73.483 kg)  02/12/12 162 lb 4.8 oz (73.619 kg)  02/12/12 162 lb 4.8 oz (73.619 kg)  02/12/12 162 lb 4.8 oz (73.619 kg)  02/12/12 162 lb 4.8 oz (73.619 kg)  10/17/11 180 lb 11.2 oz (81.965 kg)    Usual Body Weight: 180 lbs, last weighed July 2013  % Usual Body Weight: 83%  BMI:  Body mass index is 27.74 kg/(m^2).  Estimated Nutritional Needs: Kcal: 1560-1700 Protein: 54-65g Fluid: ~2.0 L/day  Skin: intact  Diet Order: Dysphagia 2, thin  EDUCATION NEEDS: -Education needs addressed   Intake/Output Summary (Last 24 hours) at  05/18/12 1538 Last data filed at 05/18/12 1049  Gross per 24 hour  Intake    603 ml  Output    250 ml  Net    353 ml    Last BM: 3/6  Labs:   Recent Labs Lab 05/16/12 1155 05/17/12 0655  NA 135 136  K 4.6 4.2  CL 99 100  CO2 24 26  BUN 43* 39*  CREATININE 2.01* 1.91*  CALCIUM 9.0 9.2  MG  --  2.1  GLUCOSE 100* 88    CBG (last 3)   Recent Labs  05/17/12 2059 05/18/12 0744 05/18/12 1202  GLUCAP 97 80 89    Scheduled Meds: . citalopram  10 mg Oral Daily  . furosemide  40 mg Oral Daily  . irbesartan  75 mg Oral Daily  . levothyroxine  100 mcg Oral QAC breakfast  . loratadine  10 mg Oral Daily  . montelukast  10 mg Oral QHS  . olopatadine  1 drop Both Eyes BID  . omega-3 acid ethyl esters  1 g Oral Daily  . pantoprazole  40 mg Oral Daily  . sodium chloride  3 mL Intravenous Q12H  . traMADol  50 mg Oral Daily  . [START ON 05/21/2012] Vitamin D (Ergocalciferol)  50,000 Units Oral Q7 days  . Warfarin - Pharmacist Dosing Inpatient   Does not apply q1800    Continuous Infusions: . 0.9 % NaCl with KCl 20 mEq / L 20 mL/hr at 05/17/12 1914    Past Medical History  Diagnosis Date  . Benign neoplasm of colon   . Diverticulosis of colon (without mention of hemorrhage)   . Reflux esophagitis   . Gastric polyp     history of  . GERD (gastroesophageal reflux disease)   . Stricture and stenosis of esophagus   . Reactive airway disease   . Anemia, iron deficiency   . CHF (congestive heart failure)   . Restless leg syndrome   . Nonischemic cardiomyopathy     EF 20-25% 01-23-12 / BiV AICD  . Personal history of sudden cardiac death successfully resuscitated     status post cardioverter- defibrillator  . Atrial fibrillation   . Diabetes mellitus   . Osteoarthritis   . Hypertension   . UTI (lower urinary tract infection)   . v45.02   . Chronic kidney disease (CKD), stage III (moderate)   . Hyperlipemia   . GERD with stricture   . Anticoagulated on Coumadin    . Shortness of breath   . Asthma   . Heart murmur   . H/O hiatal hernia     Past Surgical History  Procedure Laterality Date  . Cardiac defibrillator placement  1999, 2001, 3.30.2012    [facility], Removal of previously implanted BiV ICD followed by insertion of BiV pacemaker with pacemaker pocket revision. without immediate procedural complications.   . Eye surgery  2002    cataract   . Total hip  arthroplasty  2004    right  . Insert / replace / remove pacemaker    . Joint replacement    . Esophagogastroduodenoscopy (egd) with esophageal dilation  01/31/2012    Procedure: ESOPHAGOGASTRODUODENOSCOPY (EGD) WITH ESOPHAGEAL DILATION;  Surgeon: Rachael Fee, MD;  Location[facility] ENDOSCOPY;  Service: Endoscopy;  Laterality: N/A;  . Esophagogastroduodenoscopy  02/01/2012    Procedure: ESOPHAGOGASTRODUODENOSCOPY (EGD);  Surgeon: Rachael Fee, MD;  Location: [facility] ENDOSCOPY;  Service: Endoscopy;  Laterality: N/A;    Loyce Dys, MS RD LDN Clinical Inpatient Dietitian Pager: [phone] Weekend/After hours pager: [phone]

## 2012-05-18 NOTE — Progress Notes (Signed)
I reviewed images from yesterday's barium swallow test. She has severe esophageal dysmotility, but also appears to have a tight distal esophageal stricture. Will need to consider obtaining GI consult for possible stricture dilatation and reverse coumadin prior to the procedure. With her last stricture dilatation she had a small embolic stroke when she was off of coumadin, so we will need to bridge with heparin.

## 2012-05-18 NOTE — Progress Notes (Addendum)
ANTICOAGULATION CONSULT NOTE - Initial Consult  Pharmacy Consult for warfarin Indication: atrial fibrillation  Allergies  Allergen Reactions  . Iodinated Diagnostic Agents     Unknown    Patient Measurements: Height: 5\' 2"  (157.5 cm) Weight: 151 lb 11.2 oz (68.811 kg) IBW/kg (Calculated) : 50.1 Heparin dosing weight = 65 kg  Vital Signs: Temp: 97.4 F (36.3 C) (03/08 0500) Temp src: Oral (03/08 0500) BP: 107/66 mmHg (03/08 0500) Pulse Rate: 70 (03/08 0500)  Labs:  Recent Labs  05/16/12 1155 05/16/12 1612 05/17/12 0655 05/18/12 0507  HGB 12.2  --   --   --   HCT 35.1*  --   --   --   PLT 225  --   --   --   LABPROT  --  20.5* 20.0* 22.7*  INR  --  1.83* 1.77* 2.10*  CREATININE 2.01*  --  1.91*  --   TROPONINI <0.30  --   --   --     Estimated Creatinine Clearance: 20.3 ml/min (by C-G formula based on Cr of 1.91).   Medical History: Past Medical History  Diagnosis Date  . Benign neoplasm of colon   . Diverticulosis of colon (without mention of hemorrhage)   . Reflux esophagitis   . Gastric polyp     history of  . GERD (gastroesophageal reflux disease)   . Stricture and stenosis of esophagus   . Reactive airway disease   . Anemia, iron deficiency   . CHF (congestive heart failure)   . Restless leg syndrome   . Nonischemic cardiomyopathy     EF 20-25% 01-23-12 / BiV AICD  . Personal history of sudden cardiac death successfully resuscitated     status post cardioverter- defibrillator  . Atrial fibrillation   . Diabetes mellitus   . Osteoarthritis   . Hypertension   . UTI (lower urinary tract infection)   . v45.02   . Chronic kidney disease (CKD), stage III (moderate)   . Hyperlipemia   . GERD with stricture   . Anticoagulated on Coumadin   . Shortness of breath   . Asthma   . Heart murmur   . H/O hiatal hernia    Assessment: 51 yof on chronic coumadin for afib. Her INR is therapeutic at 2.1 today. She has severe esophageal dysmotility, might  meed GI consult for esophageal stricture dilation. Per Dr. Silvano Rusk note, if procedure is scheduled, will need to hold coumadin and bridge with lovenox. No new cbc today, no bleeding note.  Home dose: 1mg  on Thurs, 2mg  all other days  FYI: Her LFTs were elevated on 2/26 (AST/ALT = 122/133), per Corinda Gubler PA note from 3/6 recommended to discontinue amiodarone, her current AST/ALT are still slightly elevated (89/114), please consider hold amiodarone, especially since she is put on celexa (which increase risk for QT prolongation, her QTC is up at 555 ms now)   Goal of Therapy:  INR 2-3   Plan:  1. Warfarin 2mg  PO x 1 tonight 2. Daily INR  Bayard Hugger, PharmD, BCPS  Clinical Pharmacist  Pager: [phone]   05/18/2012,10:49 AM  Addendum:  Per Dr. Threasa Beards order will hold coumadin and bridge with IV heparin for pending EGD  Plan: D/c coumadin dose today F/u INR tomorrow AM and start IV heparin if INR < 2

## 2012-05-18 NOTE — Consult Note (Addendum)
[facility] Consultation  Requesting Provider: Dr. Eloise Harman  Primary Care Physician:  Garlan Fillers, MD Primary Gastroenterologist:  Dr. Yancey Flemings  Reason for Consultation:  Dysphagia, esophageal dysmotility and stricture  Assessment:  Esophageal dysmotility, ? Distal stricture vs. Spasm    Anorexia -likely from CHF, increased digoxin, amiodarone, ? Possible depression    CHF with Class III sxs (dyspnea walking room to room), elevated BNP 2013 EF 15-25%    Abnormal LFT's on amiodarone    Recent elevation of digoxin level    Hx of stroke in peri-procedure period of last EGD dilation when she had warfarin held    Weight loss/malnutrition - she reports 30# weight loss in 3 months    Chronic Renal insufficiency/dehydration     I think her current problems are multifactorial and are combination of medication side effects and CHF, as well as some of her chronic dysphagia        Recommendations: 1) I have taken liberty and discontinued amiodarone as per Dr. Odessa Fleming recommendation (in chart- lab result telephone call 3/6)    2) Dysphagia II diet - she does not necessarily get better with dilation and she clearly has dysmotility that is not correctable/treatable with dilation, and the suspected stricture could be severe spasm.     3) cover with heparin for now    4) I will follow-up - could consider EGD/dili and possibly Botox injection but she desires conservative Tx first and I think this makes sense.    5) treat other problems - CHF, dehydration    6) Nutrition consult ordered     HPI: [name] is a 79 y.o. female admitted with weakness and dyspnea 2 days ago. She was home 1 week after a 3 month SNF stay. Was extremely dyspneic, not eating or drinking well "I have no appetite". She had been seen in cardiology over past month with elevated digoxin level and then abnormal LFT's on amiodarone. Digoxin was stopped and on 3/6 it was recommended to stop amiodarone - not yet  done. She describes a constant globus sensation and intermittent dysphagia to solids. Will intermittently spit up saliva when not eating. Similar to chronic problems. She does not think her last esophageal dilation helped much. She had the stroke when warfarin held also.  She is weak and tired and unable to walk without dyspnea. No chest pain or orthopnea.    Past Medical History  Diagnosis Date  . Benign neoplasm of colon   . Diverticulosis of colon (without mention of hemorrhage)   . Reflux esophagitis   . Gastric polyp     history of  . GERD (gastroesophageal reflux disease)   . Stricture and stenosis of esophagus   . Reactive airway disease   . Anemia, iron deficiency   . CHF (congestive heart failure)   . Restless leg syndrome   . Nonischemic cardiomyopathy     EF 20-25% 01-23-12 / BiV AICD  . Personal history of sudden cardiac death successfully resuscitated     status post cardioverter- defibrillator  . Atrial fibrillation   . Diabetes mellitus   . Osteoarthritis   . Hypertension   . UTI (lower urinary tract infection)   . v45.02   . Chronic kidney disease (CKD), stage III (moderate)   . Hyperlipemia   . GERD with stricture   . Anticoagulated on Coumadin   . Shortness of breath   . Asthma   . Heart murmur   . H/O hiatal hernia  Esophageal dysmotility  Past Surgical History  Procedure Laterality Date  . Cardiac defibrillator placement  1999, 2001, 3.30.2012    [facility], Removal of previously implanted BiV ICD followed by insertion of BiV pacemaker with pacemaker pocket revision. without immediate procedural complications.   . Eye surgery  2002    cataract   . Total hip arthroplasty  2004    right  . Insert / replace / remove pacemaker    . Joint replacement    . Esophagogastroduodenoscopy (egd) with esophageal dilation  01/31/2012    Procedure: ESOPHAGOGASTRODUODENOSCOPY (EGD) WITH ESOPHAGEAL DILATION;  Surgeon: Rachael Fee, MD;  Location: [facility]  ENDOSCOPY;  Service: Endoscopy;  Laterality: N/A;  . Esophagogastroduodenoscopy  02/01/2012    Procedure: ESOPHAGOGASTRODUODENOSCOPY (EGD);  Surgeon: Rachael Fee, MD;  Location: [facility] ENDOSCOPY;  Service: Endoscopy;  Laterality: N/A;    Prior to Admission medications   Medication Sig Start Date End Date Taking? Authorizing Provider  acetaminophen (TYLENOL) 325 MG tablet Take 650 mg by mouth every 6 (six) hours as needed. For pain/fever   Yes Historical Provider, MD  amiodarone (PACERONE) 200 MG tablet Take 1 tablet (200 mg total) by mouth daily. 04/18/12  Yes Duke Salvia, MD  azelastine (OPTIVAR) 0.05 % ophthalmic solution Place 1 drop into both eyes 2 (two) times daily.   Yes Historical Provider, MD  bisacodyl (DULCOLAX) 5 MG EC tablet Take 5-10 mg by mouth daily as needed. For constipation   Yes Historical Provider, MD  desloratadine (CLARINEX) 5 MG tablet Take 5 mg by mouth daily as needed (for allergies).    Yes Historical Provider, MD  fish oil-omega-3 fatty acids 1000 MG capsule Take 1 g by mouth daily.    Yes Historical Provider, MD  furosemide (LASIX) 20 MG tablet Take 20 mg by mouth daily.   Yes Historical Provider, MD  irbesartan (AVAPRO) 75 MG tablet Take 75 mg by mouth daily.    Yes Historical Provider, MD  lansoprazole (PREVACID) 30 MG capsule Take 30 mg by mouth daily.   Yes Historical Provider, MD  levothyroxine (SYNTHROID, LEVOTHROID) 100 MCG tablet Take 1 tablet (100 mcg total) by mouth daily. 02/12/12  Yes Jarome Matin, MD  methocarbamol (ROBAXIN) 500 MG tablet Take 500 mg by mouth 3 (three) times daily as needed (for pain).   Yes Historical Provider, MD  mirtazapine (REMERON) 7.5 MG tablet Take 7.5 mg by mouth at bedtime.   Yes Historical Provider, MD  montelukast (SINGULAIR) 10 MG tablet Take 10 mg by mouth at bedtime.   Yes Historical Provider, MD  Polyethyl Glycol-Propyl Glycol (SYSTANE ULTRA OP) Apply 3 drops to eye 3 (three) times daily.   Yes Historical Provider, MD   polyethylene glycol (MIRALAX / GLYCOLAX) packet Take 17 g by mouth daily as needed.   Yes Historical Provider, MD  traMADol (ULTRAM) 50 MG tablet Take 50 mg by mouth daily.   Yes Historical Provider, MD  Vitamin D, Ergocalciferol, (DRISDOL) 50000 UNITS CAPS Take 50,000 Units by mouth every 7 (seven) days. Tuesday   Yes Historical Provider, MD  warfarin (COUMADIN) 2 MG tablet Take 1-2 mg by mouth daily. Take 1 mg on Thursday and 2 mg the rest of the week   Yes Historical Provider, MD    Current Facility-Administered Medications  Medication Dose Route Frequency Provider Last Rate Last Dose  . 0.9 % NaCl with KCl 20 mEq/ L  infusion   Intravenous Continuous Jarome Matin, MD 20 mL/hr at 05/17/12 1914    .  acetaminophen (TYLENOL) tablet 650 mg  650 mg Oral Q6H PRN Jarome Matin, MD      . alum & mag hydroxide-simeth (MAALOX/MYLANTA) 200-200-20 MG/5ML suspension 30 mL  30 mL Oral Q6H PRN Jarome Matin, MD      . amiodarone (PACERONE) tablet 200 mg  200 mg Oral Daily Jarome Matin, MD   200 mg at 05/18/12 1048  . bisacodyl (DULCOLAX) EC tablet 5 mg  5 mg Oral Daily PRN Jarome Matin, MD      . citalopram (CELEXA) tablet 10 mg  10 mg Oral Daily Jarome Matin, MD   10 mg at 05/18/12 1048  . furosemide (LASIX) 8 MG/ML solution 40 mg  40 mg Oral Daily Jarome Matin, MD      . irbesartan (AVAPRO) tablet 75 mg  75 mg Oral Daily Jarome Matin, MD   75 mg at 05/18/12 1048  . levothyroxine (SYNTHROID, LEVOTHROID) tablet 100 mcg  100 mcg Oral QAC breakfast Jarome Matin, MD   100 mcg at 05/18/12 0650  . loratadine (CLARITIN) tablet 10 mg  10 mg Oral Daily Jarome Matin, MD   10 mg at 05/18/12 1048  . methocarbamol (ROBAXIN) tablet 500 mg  500 mg Oral TID PRN Jarome Matin, MD      . montelukast (SINGULAIR) tablet 10 mg  10 mg Oral QHS Jarome Matin, MD   10 mg at 05/17/12 2238  . olopatadine (PATANOL) 0.1 % ophthalmic solution 1 drop  1 drop Both Eyes BID Jarome Matin, MD   1 drop at  05/18/12 1049  . omega-3 acid ethyl esters (LOVAZA) capsule 1 g  1 g Oral Daily Jarome Matin, MD   1 g at 05/18/12 1048  . pantoprazole (PROTONIX) EC tablet 40 mg  40 mg Oral Daily Jarome Matin, MD   40 mg at 05/18/12 1048  . polyethylene glycol (MIRALAX / GLYCOLAX) packet 17 g  17 g Oral Daily PRN Jarome Matin, MD      . promethazine (PHENERGAN) tablet 12.5 mg  12.5 mg Oral Q6H PRN Jarome Matin, MD      . sodium chloride 0.9 % injection 3 mL  3 mL Intravenous Q12H Jarome Matin, MD   3 mL at 05/18/12 1049  . traMADol (ULTRAM) tablet 50 mg  50 mg Oral Daily Jarome Matin, MD   50 mg at 05/18/12 1048  . [START ON 05/21/2012] Vitamin D (Ergocalciferol) (DRISDOL) capsule 50,000 Units  50,000 Units Oral Q7 days Jarome Matin, MD      . Warfarin - Pharmacist Dosing Inpatient   Does not apply q1800 Drake Leach Rumbarger, RPH      . zolpidem (AMBIEN) tablet 5 mg  5 mg Oral QHS PRN Jarome Matin, MD        Allergies as of 05/16/2012 - Review Complete 05/16/2012  Allergen Reaction Noted  . Iodinated diagnostic agents  05/31/2010      History   Social History  . Marital Status: Widowed    Spouse Name: N/A    Number of Children: N/A  . Years of Education: N/A   Occupational History  . Not on file.   Social History Main Topics  . Smoking status: Never Smoker   . Smokeless tobacco: Never Used  . Alcohol Use: 3.6 oz/week    6 Shots of liquor per week     Comment: OCCASSIONAL  . Drug Use: No  . Sexually Active: No      Review of Systems: Positive for those things mentioned in the HPI.  She wears eyeglasses. She always ambulates with a walker All other ROS negative Physical Exam: Vital signs in last 24 hours: Temp:  [97.4 F (36.3 C)-98.1 F (36.7 C)] 97.4 F (36.3 C) (03/08 0500) Pulse Rate:  [70] 70 (03/08 0500) Resp:  [18] 18 (03/08 0500) BP: (96-110)/(60-71) 107/66 mmHg (03/08 0500) SpO2:  [95 %-99 %] 95 % (03/08 0500) Weight:  [151 lb 11.2 oz (68.811 kg)]  151 lb 11.2 oz (68.811 kg) (03/08 0500) Last BM Date: 05/16/12 General:   Alert,  Well-developed, well-nourished, pleasant and cooperative in NAD Eyes:  Sclera clear, no icterus.   Conjunctiva pink. Mouth:  + dentures  Oropharynx pink & moist. Neck:  Supple; no masses or thyromegaly. Lungs:  Crackles throughout lower fields posteriorly  Heart:  Regular rate and rhythm; no murmurs, clicks, rubs,  or gallops. Abdomen:  Soft, nontender and nondistended.Normal bowel sounds, without guarding, and without rebound.   Extremities:  Without clubbing or edema. Neurologic:  Alert and  oriented x 3 Psych:  Alert and cooperative. Normal mood and affect.  Intake/Output from previous day: 03/07 0701 - 03/08 0700 In: 663 [P.O.:660; I.V.:3] Out: -    Lab Results:  Recent Labs  05/16/12 1155  WBC 10.4  HGB 12.2  HCT 35.1*  PLT 225   BMET  Recent Labs  05/16/12 1155 05/17/12 0655  NA 135 136  K 4.6 4.2  CL 99 100  CO2 24 26  GLUCOSE 100* 88  BUN 43* 39*  CREATININE 2.01* 1.91*  CALCIUM 9.0 9.2   LFT  Recent Labs  05/17/12 0655  PROT 6.5  ALBUMIN 2.9*  AST 89*  ALT 114*  ALKPHOS 149*  BILITOT 0.6   PT/INR  Recent Labs  05/17/12 0655 05/18/12 0507  LABPROT 20.0* 22.7*  INR 1.77* 2.10*      Studies/Results: Images viewed Dg Esophagus  05/17/2012  *RADIOLOGY REPORT*  Clinical Data: Difficulty swallowing.  History of stricture and dilatations.  ESOPHOGRAM/BARIUM SWALLOW  Technique:  Single contrast examination was performed using thin barium.  Fluoroscopy time:  3.12 minutes.  Comparison:  None.  Findings:  Severe esophageal dysmotility with a corkscrew appearance and extensive esophageal spasm with significant esophageal stasis.  There appears to be a tight stricture involving the distal esophagus but very little contrast went through and into the stomach despite elevation of the patient and using iced barium.  IMPRESSION:  1.  Severe esophageal dysmotility with diffuse  esophageal spasm and a corkscrew appearance.  Significant esophageal stasis. 2.  Suspect a tight distal esophageal stricture but very little barium went into the stomach.   Original Report Authenticated By: Rudie Meyer, M.D.    Dg Chest Portable 1 View  05/16/2012  *RADIOLOGY REPORT*  Clinical Data: 79 year old female with syncope.  PORTABLE CHEST - 1 VIEW  Comparison: 02/07/2012  Findings: Cardiomegaly with left AICD / pacemaker again noted. There is no evidence of focal airspace disease, pulmonary edema, suspicious pulmonary nodule/mass, pleural effusion, or pneumothorax. No acute bony abnormalities are identified. Degenerative changes within the shoulders again noted.  IMPRESSION: Cardiomegaly without evidence of acute cardiopulmonary disease.   Original Report Authenticated By: Harmon Pier, M.D.      Previous Endoscopies: 2006, 2008, 2013 - esophageal dilations Colonoscopy 2008     LOS: 2 days     I appreciate the opportunity to care for this patient.  @Carl  Sena Slate, MD, [facility] [phone] (pager) 05/18/2012 11:40 AM@

## 2012-05-19 DIAGNOSIS — R634 Abnormal weight loss: Secondary | ICD-10-CM

## 2012-05-19 LAB — GLUCOSE, CAPILLARY
Glucose-Capillary: 62 mg/dL — ABNORMAL LOW (ref 70–99)
Glucose-Capillary: 83 mg/dL (ref 70–99)
Glucose-Capillary: 84 mg/dL (ref 70–99)
Glucose-Capillary: 91 mg/dL (ref 70–99)

## 2012-05-19 LAB — BASIC METABOLIC PANEL
BUN: 30 mg/dL — ABNORMAL HIGH (ref 6–23)
CO2: 25 mEq/L (ref 19–32)
Chloride: 104 mEq/L (ref 96–112)
Creatinine, Ser: 1.65 mg/dL — ABNORMAL HIGH (ref 0.50–1.10)
Glucose, Bld: 80 mg/dL (ref 70–99)
Potassium: 4 mEq/L (ref 3.5–5.1)

## 2012-05-19 LAB — CBC
HCT: 31.6 % — ABNORMAL LOW (ref 36.0–46.0)
Hemoglobin: 10.7 g/dL — ABNORMAL LOW (ref 12.0–15.0)
MCV: 97.2 fL (ref 78.0–100.0)
WBC: 8.2 10*3/uL (ref 4.0–10.5)

## 2012-05-19 LAB — PROTIME-INR: INR: 2.48 — ABNORMAL HIGH (ref 0.00–1.49)

## 2012-05-19 NOTE — Progress Notes (Addendum)
[facility] Progress Note  Patient Name: [name] Date of Encounter: 05/19/2012, 12:50 PM    Subjective  She choked on some liquid medication this AM - said it was different medication than what she was used to and tasted bad and "went down the wrong way" Feels need to defecate - has had prune juice - "it will work"   Objective    Physical Exam: Filed Vitals:   05/19/12 0655  BP: 103/62  Pulse: 70  Temp: 97.9 F (36.6 C)  Resp: 18   General: NAD Lungs: clear Heart:  RRR SS2 no m Abdomen: soft and NT Psych: pleasant, coop     Labs:  Recent Labs  05/17/12 0655 05/19/12 0549  NA 136 137  K 4.2 4.0  CL 100 104  CO2 26 25  GLUCOSE 88 80  BUN 39* 30*  CREATININE 1.91* 1.65*  CALCIUM 9.2 8.4  MG 2.1  --       Recent Labs  05/19/12 0549  WBC 8.2  HGB 10.7*  HCT 31.6*  MCV 97.2  PLT 225    Lab Results  Component Value Date   INR 2.48* 05/19/2012   INR 2.10* 05/18/2012   INR 1.77* 05/17/2012       Assessment and Plan  1) Esophageal dysmotility 2) ? Stricture at GE junction  - I favor it is part of esophageal dysmotility 3) Choking, ? Aspiration this AM - she had negative MBS eval late 2013 4) Anorexia/loss of weight - suspected amiodarone toxicity and recent digoxin level elevation 5) Hx CVA periprocedure warfarin dc 2013 6) Constipated - prune juice effects pending   Seems like chopped diet went ok Will observe more before further studies (MBS or EGD) My sense is diet modification and letting amiodarone dissipate is way to go. Will see her tomorrow  Iva Boop, MD, [facility] [phone] (pager) 05/19/2012 12:55 PM

## 2012-05-19 NOTE — Progress Notes (Signed)
Subjective: Patient feeling ill this morning secondary to not having a bowel movement, just finished her in juice. Denies any overt nausea or vomiting, did tolerate her chopped food meal last night and this morning with one episode of possibly choking on medications. Denies any shortness of breath, lying flat in no respiratory distress. One episode of dizziness this morning accompanied by a blood sugar less than 60 per staff reports, now improved with most recent CBG being approximately 96.   Objective: Vital signs in last 24 hours: Temp:  [97.9 F (36.6 C)-98.2 F (36.8 C)] 97.9 F (36.6 C) (03/09 0655) Pulse Rate:  [70] 70 (03/09 0655) Resp:  [18] 18 (03/09 0655) BP: (100-103)/(50-62) 103/62 mmHg (03/09 0655) SpO2:  [97 %-99 %] 97 % (03/09 0655) Weight:  [69 kg (152 lb 1.9 oz)] 69 kg (152 lb 1.9 oz) (03/09 0655) Weight change: 0.189 kg (6.7 oz) Last BM Date: 05/16/12  CBG (last 3)   Recent Labs  05/18/12 2114 05/19/12 0711 05/19/12 0715  GLUCAP 90 62* 83    Intake/Output from previous day: 03/08 0701 - 03/09 0700 In: 243 [P.O.:240; I.V.:3] Out: 250 [Urine:250] Intake/Output this shift: Total I/O In: 243 [P.O.:240; I.V.:3] Out: 200 [Urine:200] General appearance-alert cooperative, answering all questions appropriately, no respiratory distress Neck supple, no cervical lymphadenopathy Lungs clear to auscultation bilaterally question Rales at the bases but no respiratory distress was lying flat Cardiovascular reveals regular rate and rhythm with a systolic ejection murmur Abdomen soft, subjectively tender but no rebound or guarding diffusely with complaints of constipation, nondistended, bowel sounds present No edema, cyanosis, pedal pulses intact  Lab Results:  Recent Labs  05/17/12 0655 05/19/12 0549  NA 136 137  K 4.2 4.0  CL 100 104  CO2 26 25  GLUCOSE 88 80  BUN 39* 30*  CREATININE 1.91* 1.65*  CALCIUM 9.2 8.4  MG 2.1  --     Recent Labs  05/17/12 0655   AST 89*  ALT 114*  ALKPHOS 149*  BILITOT 0.6  PROT 6.5  ALBUMIN 2.9*    Recent Labs  05/19/12 0549  WBC 8.2  HGB 10.7*  HCT 31.6*  MCV 97.2  PLT 225   No results found for this basename: CKTOTAL, CKMB, CKMBINDEX, TROPONINI,  in the last 72 hours No results found for this basename: TSH, T4TOTAL, FREET3, T3FREE, THYROIDAB,  in the last 72 hours No results found for this basename: VITAMINB12, FOLATE, FERRITIN, TIBC, IRON, RETICCTPCT,  in the last 72 hours   PT/INR 2.1 this morning Studies/Results: No results found.   Medications: Scheduled: . citalopram  10 mg Oral Daily  . furosemide  40 mg Oral Daily  . irbesartan  75 mg Oral Daily  . levothyroxine  100 mcg Oral QAC breakfast  . loratadine  10 mg Oral Daily  . montelukast  10 mg Oral QHS  . olopatadine  1 drop Both Eyes BID  . omega-3 acid ethyl esters  1 g Oral Daily  . pantoprazole  40 mg Oral Daily  . sodium chloride  3 mL Intravenous Q12H  . traMADol  50 mg Oral Daily  . [START ON 05/21/2012] Vitamin D (Ergocalciferol)  50,000 Units Oral Q7 days  . Warfarin - Pharmacist Dosing Inpatient   Does not apply q1800   Continuous: . 0.9 % NaCl with KCl 20 mEq / L 20 mL/hr at 05/17/12 1914    Assessment/Plan: Principal Problem:   Syncope, -telemetry stable, patient asymptomatic at this time, question relationship to volume  depletion as a result of GI issues, CHF appears close to being clinically compensated, amiodarone discontinued within the last 24 hours, no evidence of lower extremity edema, given dizziness this morning associated with hypoglycemia also question relationship to labile glycemic control Active Problems:   CARDIOMYOPATHY, PRIMARY-clinically comp and   Paroxysmal ventricular tachycardia-has AICD in place   Atrial flutter/Fibrillation-normal rhythm with pacemaker in place, hemodynamically stable, on Coumadin, amiodarone discontinued see below. Will check digoxin level   Chronic systolic heart  failure-clinically compensated, minor Rales at the bases, chest x-ray from 3/6 did not reveal any evidence of acute cardiopulmonary disease   ESOPHAGEAL STRICTURE-with dysmotility, barium swallow reviewed from yesterday, will plan on GI consultation, discussion with Dr. Leone Payor indicates that he wishes to have PT-INR less than 2.0 prior to any planned procedure, possibly on Monday. Note reviewed, and appreciated, amiodarone discontinued by Dr. Leone Payor, question multifactorial nature of swallowing difficulties, patient has tolerated chopped diet overnight for the most part, amiodarone discontinued we'll recheck digoxin level as possible contributing factors. Endoscopy still a possibility see below   Anticoagulated on Coumadin-patient does have a history of a small embolic stroke when off of Coumadin for short period of time for previous GI procedure, we'll bridge with heparin, and allow Coumadin to drop down below 2.0 with potential EGD per Dr. Leone Payor after his evaluation   Unspecified protein-calorie malnutrition-presumably multifactorial as a result of cardiovascular issues as well as GI issues. Elevated liver function test-amiodarone held, and digoxin level pending, will followup in the morning.    LOS: 3 days   [name] 05/19/2012, 12:29 PM

## 2012-05-19 NOTE — Progress Notes (Signed)
ANTICOAGULATION CONSULT NOTE - Initial Consult  Pharmacy Consult for warfarin Indication: atrial fibrillation  Allergies  Allergen Reactions  . Iodinated Diagnostic Agents     Unknown    Patient Measurements: Height: 5\' 2"  (157.5 cm) Weight: 152 lb 1.9 oz (69 kg) IBW/kg (Calculated) : 50.1 Heparin dosing weight = 65 kg  Vital Signs: Temp: 97.9 F (36.6 C) (03/09 0655) Temp src: Oral (03/09 0655) BP: 103/62 mmHg (03/09 0655) Pulse Rate: 70 (03/09 0655)  Labs:  Recent Labs  05/16/12 1155  05/17/12 0655 05/18/12 0507 05/19/12 0549  HGB 12.2  --   --   --  10.7*  HCT 35.1*  --   --   --  31.6*  PLT 225  --   --   --  225  LABPROT  --   < > 20.0* 22.7* 25.7*  INR  --   < > 1.77* 2.10* 2.48*  CREATININE 2.01*  --  1.91*  --  1.65*  TROPONINI <0.30  --   --   --   --   < > = values in this interval not displayed.  Estimated Creatinine Clearance: 23.5 ml/min (by C-G formula based on Cr of 1.65).   Medical History: Past Medical History  Diagnosis Date  . Benign neoplasm of colon   . Diverticulosis of colon (without mention of hemorrhage)   . Reflux esophagitis   . Gastric polyp     history of  . GERD (gastroesophageal reflux disease)   . Stricture and stenosis of esophagus   . Reactive airway disease   . Anemia, iron deficiency   . CHF (congestive heart failure)   . Restless leg syndrome   . Nonischemic cardiomyopathy     EF 20-25% 01-23-12 / BiV AICD  . Personal history of sudden cardiac death successfully resuscitated     status post cardioverter- defibrillator  . Atrial fibrillation   . Diabetes mellitus   . Osteoarthritis   . Hypertension   . UTI (lower urinary tract infection)   . v45.02   . Chronic kidney disease (CKD), stage III (moderate)   . Hyperlipemia   . GERD with stricture   . Anticoagulated on Coumadin   . Shortness of breath   . Asthma   . Heart murmur   . H/O hiatal hernia    Assessment: 26 yof on chronic coumadin for afib. She has  severe esophageal dysmotility, consulting GI consult for possible esophageal stricture dilation. Plan is to hold coumadin and bridge with IV heparin. INR is increased to 2.45 though no coumadin given last night.  Home dose: 1mg  on Thurs, 2mg  all other days  Goal of Therapy:  INR 2-3, heparin level 0.3-0.7   Plan:  Continue holding coumadin F/u INR tomorrow morning and start IV heparin if INR < 2  Bayard Hugger, PharmD, BCPS  Clinical Pharmacist  Pager: [phone]   05/19/2012,9:42 AM

## 2012-05-20 LAB — CBC
HCT: 33.5 % — ABNORMAL LOW (ref 36.0–46.0)
Platelets: 245 10*3/uL (ref 150–400)
RBC: 3.46 MIL/uL — ABNORMAL LOW (ref 3.87–5.11)
RDW: 14.1 % (ref 11.5–15.5)
WBC: 9.8 10*3/uL (ref 4.0–10.5)

## 2012-05-20 LAB — GLUCOSE, CAPILLARY: Glucose-Capillary: 98 mg/dL (ref 70–99)

## 2012-05-20 LAB — PROTIME-INR
INR: 1.95 — ABNORMAL HIGH (ref 0.00–1.49)
Prothrombin Time: 21.5 seconds — ABNORMAL HIGH (ref 11.6–15.2)

## 2012-05-20 LAB — HEPATIC FUNCTION PANEL
ALT: 67 U/L — ABNORMAL HIGH (ref 0–35)
Bilirubin, Direct: 0.2 mg/dL (ref 0.0–0.3)
Indirect Bilirubin: 0.4 mg/dL (ref 0.3–0.9)

## 2012-05-20 LAB — BASIC METABOLIC PANEL
Chloride: 103 mEq/L (ref 96–112)
Creatinine, Ser: 1.57 mg/dL — ABNORMAL HIGH (ref 0.50–1.10)
GFR calc Af Amer: 34 mL/min — ABNORMAL LOW (ref >90)
Sodium: 138 mEq/L (ref 135–145)

## 2012-05-20 LAB — HEPARIN LEVEL (UNFRACTIONATED): Heparin Unfractionated: 0.19 IU/mL — ABNORMAL LOW (ref 0.30–0.70)

## 2012-05-20 LAB — DIGOXIN LEVEL: Digoxin Level: <0.3 ng/mL — ABNORMAL LOW (ref 0.8–2.0)

## 2012-05-20 MED ORDER — NITROGLYCERIN 0.4 MG SL SUBL
SUBLINGUAL_TABLET | SUBLINGUAL | Status: AC
  Filled 2012-05-20: qty 25

## 2012-05-20 MED ORDER — HEPARIN (PORCINE) IN NACL 100-0.45 UNIT/ML-% IJ SOLN
1100.0000 [IU]/h | INTRAMUSCULAR | Status: DC
  Administered 2012-05-20: 900 [IU]/h via INTRAVENOUS
  Filled 2012-05-20 (×2): qty 250

## 2012-05-20 NOTE — Progress Notes (Signed)
Physical Therapy Treatment Patient Details Name: [name] MRN: [id number] DOB: 08-07-1926 Today's Date: 05/20/2012 Time: [id number] PT Time Calculation (min): 25 min  PT Assessment / Plan / Recommendation Comments on Treatment Session  Patient continues slow improvements with mobility/gait.    Follow Up Recommendations  Home health PT;Supervision - Intermittent (continue aide as pta)     Does the patient have the potential to tolerate intense rehabilitation     Barriers to Discharge        Equipment Recommendations  None recommended by PT    Recommendations for Other Services    Frequency Min 3X/week   Plan Discharge plan remains appropriate;Frequency remains appropriate    Precautions / Restrictions Precautions Precautions: Fall Restrictions Weight Bearing Restrictions: No   Pertinent Vitals/Pain     Mobility  Bed Mobility Bed Mobility: Supine to Sit;Sitting - Scoot to Edge of Bed;Sit to Supine Supine to Sit: 5: Supervision;HOB elevated Sitting - Scoot to Edge of Bed: 5: Supervision;With rail Sit to Supine: 5: Supervision;HOB elevated Details for Bed Mobility Assistance: Verbal cues for technique.  Assist for safety. Transfers Transfers: Sit to Stand;Stand to [employer] Transfers Sit to Stand: 5: Supervision;With upper extremity assist;From bed;With armrests;From chair/3-in-1 Stand to Sit: 5: Supervision;With upper extremity assist;With armrests;To chair/3-in-1;To bed Stand Pivot Transfers: 5: Supervision Details for Transfer Assistance: Verbal cues for hand placement.  Able to pivot bed to [facility] without RW safely.   Ambulation/Gait Ambulation/Gait Assistance: 5: Supervision Ambulation Distance (Feet): 64 Feet (with one sitting rest break) Assistive device: Rolling walker Ambulation/Gait Assistance Details: Verbal cues to stand upright, and move at slow speed to minimize dyspnea.  Patient with 3/4 dyspnea with gait. Gait Pattern: Decreased stride  length;Shuffle;Trunk flexed      PT Goals Acute Rehab PT Goals PT Goal: Supine/Side to Sit - Progress: Progressing toward goal PT Goal: Sit to Supine/Side - Progress: Progressing toward goal PT Goal: Sit to Stand - Progress: Progressing toward goal PT Goal: Stand to Sit - Progress: Progressing toward goal PT Goal: Ambulate - Progress: Progressing toward goal  Visit Information  Last PT Received On: 05/20/12 Assistance Needed: +1    Subjective Data  Subjective: "I just get so out of breath."   Cognition  Cognition Overall Cognitive Status: Appears within functional limits for tasks assessed/performed Arousal/Alertness: Awake/alert Orientation Level: Appears intact for tasks assessed Behavior During Session: [facility] for tasks performed    Balance  Balance Balance Assessed: Yes Static Standing Balance Static Standing - Balance Support: Bilateral upper extremity supported Static Standing - Level of Assistance: 6: Modified independent (Device/Increase time)  End of Session PT - End of Session Equipment Utilized During Treatment: Gait belt Activity Tolerance: Patient limited by fatigue Patient left: in bed;with call bell/phone within reach Nurse Communication: Mobility status   GP     Vena Austria 05/20/2012, 7:30 PM Durenda Hurt. Renaldo Fiddler, [facility] Acute Rehab Services Pager [phone]

## 2012-05-20 NOTE — Progress Notes (Signed)
ANTICOAGULATION CONSULT NOTE - follow up  Pharmacy Consult for IV heparin Indication: atrial fibrillation with recent stroke  Patient Measurements: Height: 5\' 2"  (157.5 cm) Weight: 153 lb 12.8 oz (69.763 kg) IBW/kg (Calculated) : 50.1 Heparin Dosing Weight: 65kg  Vital Signs: Temp: 97.3 F (36.3 C) (03/10 1400) Temp src: Oral (03/10 1400) BP: 124/81 mmHg (03/10 1400) Pulse Rate: 64 (03/10 1400)   Recent Labs  05/18/12 0507 05/19/12 0549 05/20/12 1050 05/20/12 2133  HGB  --  10.7* 11.6*  --   HCT  --  31.6* 33.5*  --   PLT  --  225 245  --   LABPROT 22.7* 25.7* 21.5*  --   INR 2.10* 2.48* 1.95*  --   HEPARINUNFRC  --   --   --  0.19*  CREATININE  --  1.65* 1.57*  --    Estimated Creatinine Clearance: 24.9 ml/min (by C-G formula based on Cr of 1.57).  Assessment: 79yo F being on IV heparin bridge therapy (on hold per GI for possible esophageal stricture dilation). Noted that patient had CVA in Dec 2013 during periprocedure coumadin interruption. INR 1.95, Hgb 11.6, Plts 245. No bleeding noted in chart. Her first HL is low at 0.19 after ~ 7.5 hours on heparin 900 units/hr.  No bleeding reported. EGD dilation is not planned per Dr. Marvell Fuller note.   Goal of Therapy:  Heparin level 0.3 - 0.5 units/ml Monitor platelets by anticoagulation protocol: Yes   Plan:   1) increase heparin drip to 1100 units/hr and check AM level in ~ 6 hours 2) consider resuming coumadin since EDG dilation is not planned 3) Daily heparin level and CBC; monitor signs/symptoms of bleeding Herby Abraham, Pharm.D. [phone] 05/20/2012 10:26 PM

## 2012-05-20 NOTE — Progress Notes (Signed)
ANTICOAGULATION CONSULT NOTE - Initial Consult  Pharmacy Consult for IV heparin Indication: atrial fibrillation with recent stroke  Patient Measurements: Height: 5\' 2"  (157.5 cm) Weight: 153 lb 12.8 oz (69.763 kg) IBW/kg (Calculated) : 50.1 Heparin Dosing Weight: 65kg  Vital Signs: Temp: 98.1 F (36.7 C) (03/10 0500) Temp src: Oral (03/10 0500) BP: 121/78 mmHg (03/10 0500) Pulse Rate: 70 (03/10 0500)   Recent Labs  05/18/12 0507 05/19/12 0549 05/20/12 1050  HGB  --  10.7* 11.6*  HCT  --  31.6* 33.5*  PLT  --  225 245  LABPROT 22.7* 25.7* 21.5*  INR 2.10* 2.48* 1.95*  CREATININE  --  1.65* 1.57*   Estimated Creatinine Clearance: 24.9 ml/min (by C-G formula based on Cr of 1.57).   Medical History: Past Medical History  Diagnosis Date  . Benign neoplasm of colon   . Diverticulosis of colon (without mention of hemorrhage)   . Reflux esophagitis   . Gastric polyp     history of  . GERD (gastroesophageal reflux disease)   . Stricture and stenosis of esophagus   . Reactive airway disease   . Anemia, iron deficiency   . CHF (congestive heart failure)   . Restless leg syndrome   . Nonischemic cardiomyopathy     EF 20-25% 01-23-12 / BiV AICD  . Personal history of sudden cardiac death successfully resuscitated     status post cardioverter- defibrillator  . Atrial fibrillation   . Diabetes mellitus   . Osteoarthritis   . Hypertension   . UTI (lower urinary tract infection)   . v45.02   . Chronic kidney disease (CKD), stage III (moderate)   . Hyperlipemia   . GERD with stricture   . Anticoagulated on Coumadin   . Shortness of breath   . Asthma   . Heart murmur   . H/O hiatal hernia    Assessment: 79yo F being now starting on IV heparin for INR < 2 (on hold per GI for possible esophageal stricture dilation). Noted that patient had CVA in Dec 2013 during periprocedure coumadin interruption. INR 1.95, Hgb 11.6, Plts 245. No bleeding noted in chart.  Goal of  Therapy:  Heparin level 0.3 - 0.5 units/ml Monitor platelets by anticoagulation protocol: Yes   Plan:   1) Start IV heparin at 900 units/hr (no bolus) 2) Check 8 hour heparin level 3) Daily heparin level and CBC; monitor signs/symptoms of bleeding 4) F/u GI plans and eventual restart of coumadin  Benjaman Pott, PharmD, BCPS 05/20/2012   12:06 PM

## 2012-05-20 NOTE — Progress Notes (Signed)
[facility] Progress Note  Patient Name: [name] Date of Encounter: 05/20/2012, 4:11 PM    Subjective  She says she is ok w/ dysphagia III diet   Objective    Physical Exam: Filed Vitals:   05/20/12 1400  BP: 124/81  Pulse: 64  Temp: 97.3 F (36.3 C)  Resp: 18   General: NAD Psych: pleasant, coop     Labs:  Recent Labs  05/19/12 0549 05/20/12 1050  NA 137 138  K 4.0 4.1  CL 104 103  CO2 25 25  GLUCOSE 80 79  BUN 30* 23  CREATININE 1.65* 1.57*  CALCIUM 8.4 8.8      Recent Labs  05/19/12 0549 05/20/12 1050  WBC 8.2 9.8  HGB 10.7* 11.6*  HCT 31.6* 33.5*  MCV 97.2 96.8  PLT 225 245    Lab Results  Component Value Date   INR 1.95* 05/20/2012   INR 2.48* 05/19/2012   INR 2.10* 05/18/2012       Assessment and Plan  1) Esophageal dysmotility 2) ? 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 Hopefully she will improve some as amiodarone wears off. Please call back if ?'s  I appreciate the opportunity to care for this patient.   Iva Boop, MD, [facility] [phone] (pager) 05/20/2012 4:11 PM

## 2012-05-20 NOTE — Progress Notes (Signed)
Subjective: Continues to feel weak, does not have much of an appetite today, no dyspnea at rest.  Objective: Vital signs in last 24 hours: Temp:  [98.1 F (36.7 C)] 98.1 F (36.7 C) (03/10 0500) Pulse Rate:  [62-70] 70 (03/10 0500) Resp:  [18] 18 (03/10 0500) BP: (91-121)/(56-78) 121/78 mmHg (03/10 0500) SpO2:  [94 %-96 %] 96 % (03/10 0500) Weight:  [69.763 kg (153 lb 12.8 oz)] 69.763 kg (153 lb 12.8 oz) (03/10 0500) Weight change: 0.763 kg (1 lb 10.9 oz)   Intake/Output from previous day: 03/09 0701 - 03/10 0700 In: 243 [P.O.:240; I.V.:3] Out: 400 [Urine:400]   General appearance: alert, cooperative and no distress Resp: clear to auscultation bilaterally Cardio: regular rate and rhythm GI: soft, non-tender; bowel sounds normal; no masses,  no organomegaly Extremities: extremities normal, atraumatic, no cyanosis or edema  Lab Results:  Recent Labs  05/19/12 0549  WBC 8.2  HGB 10.7*  HCT 31.6*  PLT 225   BMET  Recent Labs  05/19/12 0549  NA 137  K 4.0  CL 104  CO2 25  GLUCOSE 80  BUN 30*  CREATININE 1.65*  CALCIUM 8.4   CMET CMP     Component Value Date/Time   NA 137 05/19/2012 0549   K 4.0 05/19/2012 0549   CL 104 05/19/2012 0549   CO2 25 05/19/2012 0549   GLUCOSE 80 05/19/2012 0549   BUN 30* 05/19/2012 0549   CREATININE 1.65* 05/19/2012 0549   CALCIUM 8.4 05/19/2012 0549   PROT 6.5 05/17/2012 0655   ALBUMIN 2.9* 05/17/2012 0655   AST 89* 05/17/2012 0655   ALT 114* 05/17/2012 0655   ALKPHOS 149* 05/17/2012 0655   BILITOT 0.6 05/17/2012 0655   GFRNONAA 28* 05/19/2012 0549   GFRAA 32* 05/19/2012 0549    CBG (last 3)   Recent Labs  05/19/12 1701 05/19/12 2147 05/20/12 0735  GLUCAP 84 91 84    INR RESULTS:   Lab Results  Component Value Date   INR 2.48* 05/19/2012   INR 2.10* 05/18/2012   INR 1.77* 05/17/2012     Studies/Results: No results found.  Medications: I have reviewed the patient's current medications.  Assessment/Plan: #1 Near Syncope: stable on  telemetry. #2 Anorexia: unclear cause possibly from amiodarone liver toxicity versus passive liver congestion. May need to consider adding megace. Will recheck CMET tomorrow. #3 Dysphagia: multifactorial and due to dysmotility with distal esophageal stricture versus spasm. Will see how food intake goes today, consider discharge to home tomorrow if reasonable food intake today.   LOS: 4 days   PATERSON,DANIEL G 05/20/2012, 8:17 AM

## 2012-05-21 ENCOUNTER — Ambulatory Visit: Payer: Medicare Other | Admitting: Physician Assistant

## 2012-05-21 LAB — COMPREHENSIVE METABOLIC PANEL
ALT: 55 U/L — ABNORMAL HIGH (ref 0–35)
AST: 43 U/L — ABNORMAL HIGH (ref 0–37)
Calcium: 8.3 mg/dL — ABNORMAL LOW (ref 8.4–10.5)
Creatinine, Ser: 1.63 mg/dL — ABNORMAL HIGH (ref 0.50–1.10)
GFR calc Af Amer: 33 mL/min — ABNORMAL LOW (ref >90)
GFR calc non Af Amer: 28 mL/min — ABNORMAL LOW (ref >90)
Sodium: 136 mEq/L (ref 135–145)
Total Protein: 5.3 g/dL — ABNORMAL LOW (ref 6.0–8.3)

## 2012-05-21 LAB — CBC
HCT: 30.3 % — ABNORMAL LOW (ref 36.0–46.0)
MCV: 95.9 fL (ref 78.0–100.0)
Platelets: 240 10*3/uL (ref 150–400)
RBC: 3.16 MIL/uL — ABNORMAL LOW (ref 3.87–5.11)
RDW: 14.2 % (ref 11.5–15.5)
WBC: 8.4 10*3/uL (ref 4.0–10.5)

## 2012-05-21 LAB — PROTIME-INR: INR: 2.1 — ABNORMAL HIGH (ref 0.00–1.49)

## 2012-05-21 LAB — HEPARIN LEVEL (UNFRACTIONATED): Heparin Unfractionated: 0.33 IU/mL (ref 0.30–0.70)

## 2012-05-21 MED ORDER — WARFARIN SODIUM 2 MG PO TABS: ORAL_TABLET | ORAL | Status: DC

## 2012-05-21 MED ORDER — CITALOPRAM HYDROBROMIDE 10 MG PO TABS: 10.0000 mg | ORAL_TABLET | Freq: Every day | ORAL | Status: AC

## 2012-05-21 NOTE — Discharge Summary (Addendum)
Physician Discharge Summary  Patient ID: [name] MRN: [id number] DOB/AGE: [birth year] 79 y.o.  Admit date: 05/16/2012 Discharge date: 05/21/2012   Discharge Diagnoses:  Principal Problem:   Syncope, near Active Problems:   CARDIOMYOPATHY, PRIMARY   Atrial flutter/Fibrillation   Chronic systolic heart failure   ESOPHAGEAL STRICTURE   Paroxysmal ventricular tachycardia   Unspecified protein-calorie malnutrition   Anticoagulated on Coumadin   Esophageal dysmotility   Anorexia   Abnormal transaminases   Loss of weight   Discharged Condition: good  Hospital Course:  The patient is an 79 year old Caucasian woman who is well-known to me as I serve as her primary care physician. She has multiple medical problems, most notably idiopathic dilated cardiomyopathy with recent small stroke leading to a skilled nursing facility rehabilitation. She was discharged from the skilled nursing facility to home with caretaker help less than 2 weeks ago. Today when a caretaker arrived she got extremely lightheaded when going to the door, and felt as if her legs were going to give out on her. She was able to make it to a chair and sit down, but her lightheadedness persisted. She did not have vertigo or palpitations or chest pain. She has had no change in chronic mild dyspnea on exertion. She has not been eating and drinking as well as usual lately and notes mild difficulty with swallowing solids. Her last hospitalization she was taken off of Coumadin for swallowing test and had an esophageal stricture that was dilated. While off of Coumadin she had a small embolic stroke without significant residual deficits. Currently, she feels comfortable lying at 30 elevation of the head of bed. She's not having any dyspnea or chest discomfort. She has had some occasional cramping in her hands. Her past medical history is otherwise significant for chronic low back pain and spinal stenosis for which she uses a walker to  ambulate, gastroesophageal reflux, esophageal dysmotility, osteoarthritis, chronic anticoagulation with Coumadin, paroxysmal atrial fibrillation, ventricular tachycardia with sudden cardiac death leading to defibrillator implantation, diabetes mellitus, type II controlled with diet, and chronic kidney disease, stage III. She has requested a DO NOT RESUSCITATE order.  She was admitted to a medical bed with telemetry and showed no significant arrhythmia during her hospitalization. To evaluate her dysphagia and weight loss she had a barium swallow study done which showed severe esophageal dysmotility with diffuse esophageal spasm and a corkscrew appearance with significant esophageal stasis. There appeared to be either a distal tight stricture or esophageal spasm with very little contrast entering the stomach. She was then seen by a GI specialist who recommended a trial of mechanical soft diet which she tolerated reasonably well. He felt that the appearance of the esophagram was most consistent with esophageal spasm and recommended conservative treatment with diet modification rather than repeat endoscopy with possible stricture dilatation versus Botox treatment. She did well with a modified diet able to consume a reasonable amount of food. Her amiodarone was discontinued as she had mild elevation of her liver associated enzymes that could be from amiodarone. She was also seen by physical therapy personnel who felt that she could safely ambulate with a walker ,albeit slowly, because of dyspnea on exertion. Initially she was treated with IV fluids for mild dehydration which were decreased as her intravascular fluid volume normalized. The plan will be for her to return home with caretaker help during daytime hours, and close assistance by her family members. She should call our office within one day of discharge to schedule a  followup visit within one week. We will recheck her INR level at that office visit. Procedures  during her hospitalization included a barium swallow study, and there were no complications from her hospitalization.   Consults: GI  Significant Diagnostic Studies:  No results found. barium swallow study Labs: Lab Results  Component Value Date   WBC 8.4 05/21/2012   HGB 10.4* 05/21/2012   HCT 30.3* 05/21/2012   MCV 95.9 05/21/2012   PLT 240 05/21/2012     Recent Labs Lab 05/21/12 0420  NA 136  K 3.7  CL 104  CO2 23  BUN 24*  CREATININE 1.63*  CALCIUM 8.3*  PROT 5.3*  BILITOT 0.4  ALKPHOS 103  ALT 55*  AST 43*  GLUCOSE 95       Lab Results  Component Value Date   INR 2.10* 05/21/2012   INR 1.95* 05/20/2012   INR 2.48* 05/19/2012     No results found for this or any previous visit (from the past 240 hour(s)).    Discharge Exam: Blood pressure 109/71, pulse 69, temperature 97.8 F (36.6 C), temperature source Oral, resp. rate 18, height 5\' 2"  (1.575 m), weight 68.9 kg (151 lb 14.4 oz), last menstrual period 01/10/2012, SpO2 98.00%.  Physical Exam: In general, she is a mildly overweight white woman who was in no apparent distress while lying at 10 elevation head of bed. She is not using nasal cannula oxygen. HEENT exam was within normal limits, neck was supple without jugular venous distention, chest was clear to auscultation, heart had a regular rate and rhythm with a systolic ejection murmur grade 2/6, abdomen had normal bowel sounds no tenderness, extremities were without cyanosis, clubbing, or edema. She was alert and well oriented with normal affect and she did move all extremities well.  Disposition: She'll be discharged to home today in the care of her daughter. Her daughter has artery arrange for caretaker assistance at home. She should monitor her body weights every day and call either her primary care physician or her cardiologist if her weight increases 3 pounds from her initial weight.  Discharge Orders   Future Appointments Provider Department Dept Phone    07/23/2012 11:30 AM Duke Salvia, MD [facility] Main Office [place]) [phone]   Future Orders Complete By Expires     Call MD for:  As directed     Comments:      Call physician for fever, chills, worsening breathing difficulty, inability to swallow, or any other concerning symptoms.    Diet - low sodium heart healthy  As directed     Discharge instructions  As directed     Comments:      Use your walker at all times to prevent falls. Call our office tomorrow to set up a followup office visit in 1 week, at which time we will recheck the INR test. Change food to a chopped meats texture to help swallowing ability.    Increase activity slowly  As directed         Medication List    STOP taking these medications       amiodarone 200 MG tablet  Commonly known as:  PACERONE     mirtazapine 7.5 MG tablet  Commonly known as:  REMERON      TAKE these medications       acetaminophen 325 MG tablet  Commonly known as:  TYLENOL  Take 650 mg by mouth every 6 (six) hours as needed. For pain/fever  azelastine 0.05 % ophthalmic solution  Commonly known as:  OPTIVAR  Place 1 drop into both eyes 2 (two) times daily.     bisacodyl 5 MG EC tablet  Commonly known as:  DULCOLAX  Take 5-10 mg by mouth daily as needed. For constipation     citalopram 10 MG tablet  Commonly known as:  CELEXA  Take 1 tablet (10 mg total) by mouth daily.     desloratadine 5 MG tablet  Commonly known as:  CLARINEX  Take 5 mg by mouth daily as needed (for allergies).     fish oil-omega-3 fatty acids 1000 MG capsule  Take 1 g by mouth daily.     furosemide 20 MG tablet  Commonly known as:  LASIX  Take 20 mg by mouth daily.     irbesartan 75 MG tablet  Commonly known as:  AVAPRO  Take 75 mg by mouth daily.     lansoprazole 30 MG capsule  Commonly known as:  PREVACID  Take 30 mg by mouth daily.     levothyroxine 100 MCG tablet  Commonly known as:  SYNTHROID, LEVOTHROID  Take 1 tablet (100  mcg total) by mouth daily.     methocarbamol 500 MG tablet  Commonly known as:  ROBAXIN  Take 500 mg by mouth 3 (three) times daily as needed (for pain).     montelukast 10 MG tablet  Commonly known as:  SINGULAIR  Take 10 mg by mouth at bedtime.     polyethylene glycol packet  Commonly known as:  MIRALAX / GLYCOLAX  Take 17 g by mouth daily as needed.     SYSTANE ULTRA OP  Apply 3 drops to eye 3 (three) times daily.     traMADol 50 MG tablet  Commonly known as:  ULTRAM  Take 50 mg by mouth daily.     Vitamin D (Ergocalciferol) 50000 UNITS Caps  Commonly known as:  DRISDOL  Take 50,000 Units by mouth every 7 (seven) days. Tuesday     warfarin 2 MG tablet  Commonly known as:  COUMADIN  Take 1 mg (1/2 tablet) by mouth every day of the week           Follow-up Information      Contact information:         Follow up with Stan Head, MD. Schedule an appointment as soon as possible for a visit in 4 weeks.   Contact information:   [address] [place] [address] [phone]       Follow up with Garlan Fillers, MD. Schedule an appointment as soon as possible for a visit in 1 week.   Contact information:   [address] [facility] [place] [address] [phone]       Signed: Garlan Fillers 05/21/2012, 9:23 AM

## 2012-05-21 NOTE — Progress Notes (Signed)
ANTICOAGULATION CONSULT NOTE - follow up  Pharmacy Consult for IV heparin Indication: atrial fibrillation with recent stroke  Patient Measurements: Height: 5\' 2"  (157.5 cm) Weight: 151 lb 14.4 oz (68.9 kg) IBW/kg (Calculated) : 50.1 Heparin Dosing Weight: 65kg  Vital Signs: Temp: 97.8 F (36.6 C) (03/11 0500) Temp src: Oral (03/11 0500) BP: 109/71 mmHg (03/11 0500) Pulse Rate: 69 (03/11 0500)   Recent Labs  05/19/12 0549 05/20/12 1050 05/20/12 2133 05/21/12 0420  HGB 10.7* 11.6*  --  10.4*  HCT 31.6* 33.5*  --  30.3*  PLT 225 245  --  240  LABPROT 25.7* 21.5*  --  22.7*  INR 2.48* 1.95*  --  2.10*  HEPARINUNFRC  --   --  0.19* 0.33  CREATININE 1.65* 1.57*  --  1.63*   Estimated Creatinine Clearance: 23.8 ml/min (by C-G formula based on Cr of 1.63).  Assessment: 79yo F being on IV heparin bridge therapy (on hold per GI for possible esophageal stricture dilation). Noted that patient had CVA in Dec 2013 during periprocedure coumadin interruption. INR 2.1 , Hgb 10.4, Plts 240. Heparin level at goal.  No bleeding noted, plan to discharge home this afternoon on home dose of warfarin 2 mg daily.   Goal of Therapy:  Heparin level 0.3 - 0.5 units/ml Monitor platelets by anticoagulation protocol: Yes   Plan:   1) Discontinue heparin on discharge 2) Resume home dose of warfarin 3) Follow up outpatient INR within a week of discharge unless otherwise clinically indicated.  Thank you for allowing pharmacy to be a part of this patients care team.  Lovenia Kim Pharm.D., BCPS Clinical Pharmacist 05/21/2012 3:27 PM Pager: [phone] Phone: [phone]

## 2012-05-22 NOTE — Telephone Encounter (Signed)
lmptcb to go over lab results from [name]; see [facility] D/C that amiodarone was stopped at the time of d/c. will advise for pt to f/u with PCP in 4 weeks with LFTs to be done then. I will cb tonmorow

## 2012-05-22 NOTE — Telephone Encounter (Signed)
lmptcb to go over lab results from [name]; see [facility] D/C that amiodarone was stopped at the time of d/c. will advise for pt to f/u with PCP in 4 weeks with LFTs to be done then. I will cb tonmorow 

## 2012-05-24 ENCOUNTER — Telehealth: Payer: Self-pay | Admitting: Internal Medicine

## 2012-05-24 DIAGNOSIS — I5022 Chronic systolic (congestive) heart failure: Secondary | ICD-10-CM

## 2012-05-24 NOTE — Telephone Encounter (Signed)
s/w pt's daughter Annice Pih about pt needing LFT 1 month after amiodarone was stopped in [facility]. will get done at Dr. Lamar Sprinkles [facility] 06/24/12, daughter said thank you for the help, said you are welcome

## 2012-05-24 NOTE — Telephone Encounter (Signed)
s/w pt's daughter Jackie about pt needing LFT 1 month after amiodarone was stopped in [facility]. will get done at Dr. Perry's [facility] 06/24/12, daughter said thank you for the help, said you are welcome 

## 2012-05-24 NOTE — Telephone Encounter (Signed)
Follow-up:    Patient's daughter called in returning your call.  Please call back.

## 2012-06-24 ENCOUNTER — Ambulatory Visit (INDEPENDENT_AMBULATORY_CARE_PROVIDER_SITE_OTHER): Payer: Medicare Other | Admitting: Internal Medicine

## 2012-06-24 ENCOUNTER — Encounter: Payer: Self-pay | Admitting: Internal Medicine

## 2012-06-24 ENCOUNTER — Other Ambulatory Visit (INDEPENDENT_AMBULATORY_CARE_PROVIDER_SITE_OTHER): Payer: Medicare Other

## 2012-06-24 ENCOUNTER — Other Ambulatory Visit: Payer: Medicare Other

## 2012-06-24 VITALS — BP 110/62 | HR 78 | Ht 62.0 in

## 2012-06-24 DIAGNOSIS — I5022 Chronic systolic (congestive) heart failure: Secondary | ICD-10-CM

## 2012-06-24 DIAGNOSIS — R933 Abnormal findings on diagnostic imaging of other parts of digestive tract: Secondary | ICD-10-CM

## 2012-06-24 DIAGNOSIS — K224 Dyskinesia of esophagus: Secondary | ICD-10-CM

## 2012-06-24 DIAGNOSIS — R1310 Dysphagia, unspecified: Secondary | ICD-10-CM

## 2012-06-24 LAB — HEPATIC FUNCTION PANEL
ALT: 44 U/L — ABNORMAL HIGH (ref 0–35)
Alkaline Phosphatase: 138 U/L — ABNORMAL HIGH (ref 39–117)
Bilirubin, Direct: 0.1 mg/dL (ref 0.0–0.3)
Total Protein: 6.6 g/dL (ref 6.0–8.3)

## 2012-06-24 NOTE — Patient Instructions (Addendum)
Please go to the lab for your scheduled hepatic function test

## 2012-06-24 NOTE — Progress Notes (Signed)
HISTORY OF PRESENT ILLNESS:  [name] is a 79 y.o. female with multiple significant medical problems as listed below. She presents today for post hospital followup after having had an upper endoscopy with esophageal dilation by Dr. Christella Hartigan in November of 2013. Patient did have a distal stricture of the esophagus dilated with 20 mm balloon. This did not help her dysphagia. She has been shown to have dysmotility on prior esophagram. Severe dysmotility as recent as 05/16/2012. She is accompanied today by her daughter. Interestingly, she reports no difficulties with swallowing. She states that she's eating whatever she likes without difficulty. They state that are here because they were told to followup after her procedure. Of note, she did have a stroke off anticoagulation for her last procedure. She has been a nursing facility until recently. GI review of systems is otherwise negative  REVIEW OF SYSTEMS:  All non-GI ROS negative except for throat clearing, heart murmur, irregular heart rate, allergies, arthritis, back pain  Past Medical History  Diagnosis Date  . Benign neoplasm of colon   . Diverticulosis of colon (without mention of hemorrhage)   . Reflux esophagitis   . Gastric polyp     history of  . GERD (gastroesophageal reflux disease)   . Stricture and stenosis of esophagus   . Reactive airway disease   . Anemia, iron deficiency   . CHF (congestive heart failure)   . Restless leg syndrome   . Nonischemic cardiomyopathy     EF 20-25% 01-23-12 / BiV AICD  . Personal history of sudden cardiac death successfully resuscitated     status post cardioverter- defibrillator  . Atrial fibrillation   . Diabetes mellitus     controlled with diet  . Osteoarthritis   . Hypertension   . UTI (lower urinary tract infection)   . v45.02   . Chronic kidney disease (CKD), stage III (moderate)   . Hyperlipemia   . Anticoagulated on Coumadin   . Shortness of breath   . Asthma   . Heart murmur    . H/O hiatal hernia     Past Surgical History  Procedure Laterality Date  . Cardiac defibrillator placement  1999, 2001, 3.30.2012    [facility], Removal of previously implanted BiV ICD followed by insertion of BiV pacemaker with pacemaker pocket revision. without immediate procedural complications.   . Eye surgery  2002    cataract   . Total hip arthroplasty  2004    right  . Insert / replace / remove pacemaker    . Joint replacement    . Esophagogastroduodenoscopy (egd) with esophageal dilation  01/31/2012    Procedure: ESOPHAGOGASTRODUODENOSCOPY (EGD) WITH ESOPHAGEAL DILATION;  Surgeon: Rachael Fee, MD;  Location: [facility] ENDOSCOPY;  Service: Endoscopy;  Laterality: N/A;  . Esophagogastroduodenoscopy  02/01/2012    Procedure: ESOPHAGOGASTRODUODENOSCOPY (EGD);  Surgeon: Rachael Fee, MD;  Location: [facility] ENDOSCOPY;  Service: Endoscopy;  Laterality: N/A;    Social History [name]  reports that she has never smoked. She has never used smokeless tobacco. She reports that she drinks about 3.6 ounces of alcohol per week. She reports that she does not use illicit drugs.  family history is not on file.  Allergies  Allergen Reactions  . Iodinated Diagnostic Agents     Unknown       PHYSICAL EXAMINATION: Vital signs: BP 110/62  Pulse 78  Ht 5\' 2"  (1.575 m)  LMP 01/10/2012 General: Well-developed, well-nourished, no acute distress. Sitting in a wheelchair HEENT:  Sclerae are anicteric, conjunctiva pink. Oral mucosa intact. Normal posterior pharynx Lungs: Clear Heart: Irregular. Systolic murmur Abdomen: soft, obese, nontender, nondistended, no obvious ascites, no peritoneal signs, normal bowel sounds. No organomegaly. Extremities: No edema Psychiatric: alert and oriented x3. Cooperative   ASSESSMENT:  #1. Esophageal dysmotility. Very little in the way of symptoms at present, fortunately since there is very little in the way of treatment for this problem. #2. GERD.  On PPI #3. Distal esophageal stricture dilated to 20 mm in November 2013 #4. Multiple medical problems   PLAN:  #1. Continue PPI #2. Followup when necessary. #3. Resume general medical care with Dr. Eloise Harman

## 2012-07-23 ENCOUNTER — Encounter: Payer: Self-pay | Admitting: Internal Medicine

## 2012-07-23 ENCOUNTER — Telehealth: Payer: Self-pay | Admitting: Internal Medicine

## 2012-07-23 ENCOUNTER — Ambulatory Visit (INDEPENDENT_AMBULATORY_CARE_PROVIDER_SITE_OTHER): Payer: Medicare Other | Admitting: Internal Medicine

## 2012-07-23 VITALS — BP 112/74 | HR 70 | Ht 62.0 in | Wt 161.0 lb

## 2012-07-23 DIAGNOSIS — I5022 Chronic systolic (congestive) heart failure: Secondary | ICD-10-CM

## 2012-07-23 DIAGNOSIS — I4892 Unspecified atrial flutter: Secondary | ICD-10-CM

## 2012-07-23 DIAGNOSIS — I2589 Other forms of chronic ischemic heart disease: Secondary | ICD-10-CM

## 2012-07-23 DIAGNOSIS — I255 Ischemic cardiomyopathy: Secondary | ICD-10-CM

## 2012-07-23 LAB — PACEMAKER DEVICE OBSERVATION
AL AMPLITUDE: 1 mv
AL IMPEDENCE PM: 366 Ohm
BAMS-0002: 8 ms
BAMS-0003: 70 {beats}/min
DEVICE MODEL PM: 108561
LV LEAD IMPEDENCE PM: 458 Ohm
RV LEAD IMPEDENCE PM: 480 Ohm
VENTRICULAR PACING PM: 99

## 2012-07-23 MED ORDER — SPIRONOLACTONE 25 MG PO TABS: ORAL_TABLET | ORAL | Status: DC

## 2012-07-23 NOTE — Progress Notes (Signed)
Patient Care Team: Jarome Matin, MD as PCP - General Duke Salvia, MD as Attending Physician (Cardiology)   HPI  [name] is a 79 y.o. female  Seen in followup for chronic systolic CHF in the setting of ischemic cardiomyopathy with prior CRT-D implantation that was changed out for a CRT P. 2012 .  The procedure was complicated by a hematoma that was successfully managed noninvasively with Elastoplast dressing \  She was hospitalized 11/13 for dysphagia. That hospitalization was complicated by heart failure in part triggered by FFP given to reverse Coumadin. Echo 11/13>> EF 15-25% with multiple wall motion abnormalities and moderate MR. There is moderate pulmonary hypertension.   She also told of atrial fibrillation and there is an episode of a what ws  thought to be a thromboembolic event.  There had been a   hosp 11/13 for dysphagia;  Her anticoagulation was put on hold for esophogeal dilitation and she ended up with intercurrent stroke.  She is back on warfarin. She was previously on amiodarone but this was stopped because of elevated LFTs 3/14 during another hospitalization for esophageal obstruction>> recommendation at that time was soft diet .  They were still abnormal 4/14  She has no significant edema. She is gaining weight on her new diet    Prior visit with me was also notable for alopecia which improved with discontinuation of her carvedilol; TSH was normal  Past Medical History  Diagnosis Date  . Benign neoplasm of colon   . Diverticulosis of colon (without mention of hemorrhage)   . Reflux esophagitis   . Gastric polyp     history of  . GERD (gastroesophageal reflux disease)   . Stricture and stenosis of esophagus   . Reactive airway disease   . Anemia, iron deficiency   . CHF (congestive heart failure)   . Restless leg syndrome   . Nonischemic cardiomyopathy     EF 20-25% 01-23-12 / BiV AICD  . Personal history of sudden cardiac death successfully  resuscitated     status post cardioverter- defibrillator  . Atrial fibrillation   . Diabetes mellitus     controlled with diet  . Osteoarthritis   . Hypertension   . UTI (lower urinary tract infection)   . v45.02   . Chronic kidney disease (CKD), stage III (moderate)   . Hyperlipemia   . Anticoagulated on Coumadin   . Shortness of breath   . Asthma   . Heart murmur   . H/O hiatal hernia     Past Surgical History  Procedure Laterality Date  . Cardiac defibrillator placement  1999, 2001, 3.30.2012    [facility] [employer], Removal of previously implanted BiV ICD followed by insertion of BiV pacemaker with pacemaker pocket revision. without immediate procedural complications.   . Eye surgery  2002    cataract   . Total hip arthroplasty  2004    right  . Insert / replace / remove pacemaker    . Joint replacement    . Esophagogastroduodenoscopy (egd) with esophageal dilation  01/31/2012    Procedure: ESOPHAGOGASTRODUODENOSCOPY (EGD) WITH ESOPHAGEAL DILATION;  Surgeon: Rachael Fee, MD;  Location: [facility] ENDOSCOPY;  Service: Endoscopy;  Laterality: N/A;  . Esophagogastroduodenoscopy  02/01/2012    Procedure: ESOPHAGOGASTRODUODENOSCOPY (EGD);  Surgeon: Rachael Fee, MD;  Location: [facility] ENDOSCOPY;  Service: Endoscopy;  Laterality: N/A;    Current Outpatient Prescriptions  Medication Sig Dispense Refill  . acetaminophen (TYLENOL) 325 MG tablet Take 650 mg by mouth every  6 (six) hours as needed. For pain/fever      . azelastine (OPTIVAR) 0.05 % ophthalmic solution Place 1 drop into both eyes 2 (two) times daily.      . bisacodyl (DULCOLAX) 5 MG EC tablet Take 5-10 mg by mouth daily as needed. For constipation      . citalopram (CELEXA) 10 MG tablet Take 1 tablet (10 mg total) by mouth daily.  30 tablet  12  . desloratadine (CLARINEX) 5 MG tablet Take 5 mg by mouth daily as needed (for allergies).       . fish oil-omega-3 fatty acids 1000 MG capsule Take 1 g by mouth daily.       .  furosemide (LASIX) 20 MG tablet Take 20 mg by mouth daily.      . irbesartan (AVAPRO) 75 MG tablet Take 75 mg by mouth daily.       . lansoprazole (PREVACID) 30 MG capsule Take 30 mg by mouth daily.      Marland Kitchen levothyroxine (SYNTHROID, LEVOTHROID) 100 MCG tablet Take 1 tablet (100 mcg total) by mouth daily.  30 tablet  12  . methocarbamol (ROBAXIN) 500 MG tablet Take 500 mg by mouth 3 (three) times daily as needed (for pain).      . montelukast (SINGULAIR) 10 MG tablet Take 10 mg by mouth at bedtime.      . polyethylene glycol (MIRALAX / GLYCOLAX) packet Take 17 g by mouth daily as needed.      . traMADol (ULTRAM) 50 MG tablet Take 50 mg by mouth daily.      . Vitamin D, Ergocalciferol, (DRISDOL) 50000 UNITS CAPS Take 50,000 Units by mouth every 7 (seven) days. Tuesday      . warfarin (COUMADIN) 2 MG tablet Take 1 mg (1/2 tablet) by mouth every day of the week  30 tablet  12   No current facility-administered medications for this visit.    Allergies  Allergen Reactions  . Iodinated Diagnostic Agents     Unknown    Review of Systems negative except from HPI and PMH  Physical Exam BP 112/74  Pulse 70  Ht 5\' 2"  (1.575 m)  Wt 161 lb (73.029 kg)  BMI 29.44 kg/m2  LMP 01/10/2012 Well developed and well nourished in no acute distress HENT normal E scleral and icterus clear Neck Supple JVP flat; carotids brisk and full Clear to ausculation Regular rate and rhythm, no murmurs gallops or rub Soft with active bowel sounds No clubbing cyanosis Trace Edema Alert and oriented, grossly normal motor and sensory function Skin Warm and Dry    Assessment and  Plan

## 2012-07-23 NOTE — Assessment & Plan Note (Signed)
Amiodarone has been discontinued because of LFT abnormalities. They persist. I suspect it is not related to amiodarone

## 2012-07-23 NOTE — Telephone Encounter (Signed)
Will make dr klein aware 

## 2012-07-23 NOTE — Assessment & Plan Note (Signed)
As above.

## 2012-07-23 NOTE — Assessment & Plan Note (Signed)
Euvolemic. Will continue current medications 

## 2012-07-23 NOTE — Patient Instructions (Addendum)
Please call our office back to clarify your medication list. [phone].  Restart spironolactone (aldactone) 25 mg 1/2 tablet by mouth once daily.  Labwork in 2 weeks, 6 weeks, and 12 weeks- BMP (orders have been given to you to take to Dr. Norval Gable office- they can fax results to [place]).  Your physician wants you to follow-up in: 6 months with Dr. Graciela Husbands. You will receive a reminder letter in the mail two months in advance. If you don't receive a letter, please call our office to schedule the follow-up appointment.

## 2012-07-23 NOTE — Assessment & Plan Note (Signed)
No intercurrent Ventricular tachycardia  

## 2012-07-23 NOTE — Telephone Encounter (Signed)
New Problem:    Yes the patient is taking Avapro. No she is not taking Digoxin.  Did not wish to leave a return number or be called back.

## 2012-07-23 NOTE — Assessment & Plan Note (Addendum)
We'll continue her on her ARB;  We will continue her spironolactone. Her beta blockers were discontinued because of alopecia  Resumption of sprironolactone will require metabolic profile at weeks 2, 6, and 12

## 2012-07-24 NOTE — Telephone Encounter (Signed)
Dr Graciela Husbands made aware, no changes at this time.

## 2012-08-15 ENCOUNTER — Encounter: Payer: Self-pay | Admitting: Internal Medicine

## 2013-02-11 ENCOUNTER — Encounter: Payer: Self-pay | Admitting: *Deleted

## 2013-05-19 ENCOUNTER — Encounter: Payer: Self-pay | Admitting: Internal Medicine

## 2013-05-23 ENCOUNTER — Telehealth: Payer: Self-pay | Admitting: Internal Medicine

## 2013-05-23 NOTE — Telephone Encounter (Signed)
Pt tells me she has been SOB with no energy for the past week. She saw Philip Aspen, her PCP, Monday and he changed her medications. She was slightly wheezy on phone and states she would like to be seen. I scheduled her for Monday to see Caryl Comes.  I instructed her to bring her new medications/medication changes with her to appt. Advised if symptoms worsen over the weekend to go to ED. Pt agreeable to plan.

## 2013-05-23 NOTE — Telephone Encounter (Signed)
New Message:  Pt is c/o a-fib, no energy and being tired. Pt would like to speak to the nurse and be seen today.

## 2013-05-23 NOTE — Telephone Encounter (Signed)
Did not have pt's number.... Sending message with pt's number

## 2013-05-23 NOTE — Telephone Encounter (Signed)
New Message:  Pt is c/o a-fib, no energy, and being tired. Pt wants to speak to a nurse and be seen today.

## 2013-05-26 ENCOUNTER — Encounter: Payer: Self-pay | Admitting: Internal Medicine

## 2013-05-26 ENCOUNTER — Ambulatory Visit (INDEPENDENT_AMBULATORY_CARE_PROVIDER_SITE_OTHER): Payer: Medicare Other | Admitting: Internal Medicine

## 2013-05-26 VITALS — BP 98/60 | HR 89 | Ht 62.0 in | Wt 164.0 lb

## 2013-05-26 DIAGNOSIS — I4892 Unspecified atrial flutter: Secondary | ICD-10-CM

## 2013-05-26 DIAGNOSIS — Z01812 Encounter for preprocedural laboratory examination: Secondary | ICD-10-CM

## 2013-05-26 DIAGNOSIS — Z5181 Encounter for therapeutic drug level monitoring: Secondary | ICD-10-CM

## 2013-05-26 DIAGNOSIS — I2589 Other forms of chronic ischemic heart disease: Secondary | ICD-10-CM

## 2013-05-26 DIAGNOSIS — I5022 Chronic systolic (congestive) heart failure: Secondary | ICD-10-CM

## 2013-05-26 DIAGNOSIS — I4891 Unspecified atrial fibrillation: Secondary | ICD-10-CM

## 2013-05-26 DIAGNOSIS — Z950 Presence of cardiac pacemaker: Secondary | ICD-10-CM

## 2013-05-26 DIAGNOSIS — I255 Ischemic cardiomyopathy: Secondary | ICD-10-CM

## 2013-05-26 LAB — MDC_IDC_ENUM_SESS_TYPE_INCLINIC
Brady Statistic RA Percent Paced: 66 %
Lead Channel Impedance Value: 462 Ohm
Lead Channel Pacing Threshold Amplitude: 1.2 V
Lead Channel Pacing Threshold Pulse Width: 0.4 ms
Lead Channel Sensing Intrinsic Amplitude: 0.8 mV
Lead Channel Setting Pacing Amplitude: 2.4 V
Lead Channel Setting Pacing Pulse Width: 0.5 ms
MDC IDC MSMT LEADCHNL LV IMPEDANCE VALUE: 584 Ohm
MDC IDC MSMT LEADCHNL LV SENSING INTR AMPL: 9.5 mV
MDC IDC MSMT LEADCHNL RA IMPEDANCE VALUE: 436 Ohm
MDC IDC MSMT LEADCHNL RV PACING THRESHOLD AMPLITUDE: 1 V
MDC IDC MSMT LEADCHNL RV PACING THRESHOLD PULSEWIDTH: 0.4 ms
MDC IDC MSMT LEADCHNL RV SENSING INTR AMPL: 9.5 mV
MDC IDC PG SERIAL: 108561
MDC IDC SET LEADCHNL LV SENSING SENSITIVITY: 2.5 mV
MDC IDC SET LEADCHNL RA PACING AMPLITUDE: 2 V
MDC IDC SET LEADCHNL RV PACING AMPLITUDE: 2.4 V
MDC IDC SET LEADCHNL RV PACING PULSEWIDTH: 0.5 ms
MDC IDC SET LEADCHNL RV SENSING SENSITIVITY: 2.5 mV
MDC IDC STAT BRADY RV PERCENT PACED: 86 %

## 2013-05-26 LAB — CBC WITH DIFFERENTIAL/PLATELET
Basophils Absolute: 0 10*3/uL (ref 0.0–0.1)
Basophils Relative: 0.4 % (ref 0.0–3.0)
Eosinophils Absolute: 0.1 10*3/uL (ref 0.0–0.7)
Eosinophils Relative: 0.9 % (ref 0.0–5.0)
HCT: 33.2 % — ABNORMAL LOW (ref 36.0–46.0)
Hemoglobin: 10.8 g/dL — ABNORMAL LOW (ref 12.0–15.0)
Lymphocytes Relative: 11.4 % — ABNORMAL LOW (ref 12.0–46.0)
Lymphs Abs: 1.2 10*3/uL (ref 0.7–4.0)
MCHC: 32.6 g/dL (ref 30.0–36.0)
MCV: 94.2 fl (ref 78.0–100.0)
Monocytes Absolute: 0.7 10*3/uL (ref 0.1–1.0)
Monocytes Relative: 6.6 % (ref 3.0–12.0)
Neutro Abs: 8.5 10*3/uL — ABNORMAL HIGH (ref 1.4–7.7)
Neutrophils Relative %: 80.7 % — ABNORMAL HIGH (ref 43.0–77.0)
Platelets: 267 10*3/uL (ref 150.0–400.0)
RBC: 3.52 Mil/uL — ABNORMAL LOW (ref 3.87–5.11)
RDW: 15.2 % — ABNORMAL HIGH (ref 11.5–14.6)
WBC: 10.5 10*3/uL (ref 4.5–10.5)

## 2013-05-26 LAB — BASIC METABOLIC PANEL
BUN: 45 mg/dL — ABNORMAL HIGH (ref 6–23)
CHLORIDE: 102 meq/L (ref 96–112)
CO2: 26 mEq/L (ref 19–32)
Calcium: 9.1 mg/dL (ref 8.4–10.5)
Creatinine, Ser: 1.6 mg/dL — ABNORMAL HIGH (ref 0.4–1.2)
GFR: 33.8 mL/min — ABNORMAL LOW (ref >60.00)
Glucose, Bld: 94 mg/dL (ref 70–99)
POTASSIUM: 4.1 meq/L (ref 3.5–5.1)
SODIUM: 137 meq/L (ref 135–145)

## 2013-05-26 LAB — PROTIME-INR
INR: 3.1 ratio — ABNORMAL HIGH (ref 0.8–1.0)
PROTHROMBIN TIME: 32.1 s — AB (ref 10.2–12.4)

## 2013-05-26 MED ORDER — METOPROLOL SUCCINATE ER 25 MG PO TB24: 25.0000 mg | ORAL_TABLET | Freq: Every day | ORAL | Status: DC

## 2013-05-26 MED ORDER — IRBESARTAN 75 MG PO TABS: 34.5000 mg | ORAL_TABLET | Freq: Every day | ORAL | Status: DC

## 2013-05-26 NOTE — Progress Notes (Signed)
Patient Care Team: Leanna Battles, MD as PCP - General Deboraha Sprang, MD as Attending Physician (Cardiology)   HPI  [name] is a 80 y.o. female Seen in followup for chronic systolic CHF in the setting of inoni be ableschemic cardiomyopathy with prior CRT-D implantation that was changed out for a CRT P. 2012 .  Echo 11/13>> EF 15-25% with multiple wall motion abnormalities and moderate MR. There is moderate pulmonary hypertension There had been a hosp 11/13 for dysphagia; Her anticoagulation was put on hold for esophogeal dilitation and she ended up with intercurrent stroke. She is back on warfarin. She was previously on amiodarone but this was stopped because of elevated LFTs 3/14   She comes in with complaint of fatigue and exercise intolerance. She was noted by Dr. Valetta Fuller last week to be in atrial fibrillation. Koodoos to him  She has not had any edema. Her blood pressure has been low.  She has a history of anemia. Hemoglobin last week was 10. Her potassium was 5.6 on Aldactone.  Past Medical History  Diagnosis Date  . Benign neoplasm of colon   . Diverticulosis of colon (without mention of hemorrhage)   . Reflux esophagitis   . Gastric polyp     history of  . GERD (gastroesophageal reflux disease)   . Stricture and stenosis of esophagus   . Reactive airway disease   . Anemia, iron deficiency   . CHF (congestive heart failure)   . Restless leg syndrome   . Nonischemic cardiomyopathy     EF 20-25% 01-23-12 / BiV AICD  . Atrial fibrillation   . Diabetes mellitus     controlled with diet  . Osteoarthritis   . Hypertension   . UTI (lower urinary tract infection)   . Pacemaker CRT  BSx   . Chronic kidney disease (CKD), stage III (moderate)   . Hyperlipemia   . Anticoagulated on Coumadin   . Shortness of breath   . Asthma   . Alopecia  2/2 betablockers     Past Surgical History  Procedure Laterality Date  . Cardiac defibrillator placement  1999,  2001, 3.30.2012    [facility], Removal of previously implanted BiV ICD followed by insertion of BiV pacemaker with pacemaker pocket revision. without immediate procedural complications.   . Eye surgery  2002    cataract   . Total hip arthroplasty  2004    right  . Insert / replace / remove pacemaker    . Joint replacement    . Esophagogastroduodenoscopy (egd) with esophageal dilation  01/31/2012    Procedure: ESOPHAGOGASTRODUODENOSCOPY (EGD) WITH ESOPHAGEAL DILATION;  Surgeon: Milus Banister, MD;  Location: [facility];  Service: Endoscopy;  Laterality: N/A;  . Esophagogastroduodenoscopy  02/01/2012    Procedure: ESOPHAGOGASTRODUODENOSCOPY (EGD);  Surgeon: Milus Banister, MD;  Location: [place];  Service: Endoscopy;  Laterality: N/A;    Current Outpatient Prescriptions  Medication Sig Dispense Refill  . acetaminophen (TYLENOL) 325 MG tablet Take 650 mg by mouth every 6 (six) hours as needed. For pain/fever      . azelastine (OPTIVAR) 0.05 % ophthalmic solution Place 1 drop into both eyes 2 (two) times daily.      . budesonide-formoterol (SYMBICORT) 160-4.5 MCG/ACT inhaler Inhale 2 puffs into the lungs daily.      . citalopram (CELEXA) 10 MG tablet Take 1 tablet (10 mg total) by mouth daily.  30 tablet  12  . desloratadine (CLARINEX) 5 MG  tablet Take 5 mg by mouth daily as needed (for allergies).       . diclofenac sodium (VOLTAREN) 1 % GEL Apply topically. As needed      . fish oil-omega-3 fatty acids 1000 MG capsule Take 1 g by mouth daily.       . furosemide (LASIX) 20 MG tablet Take 40 mg by mouth daily.       . hydrOXYzine (ATARAX/VISTARIL) 25 MG tablet Take 25 mg by mouth 3 (three) times daily as needed.      . lansoprazole (PREVACID) 30 MG capsule Take 30 mg by mouth daily.      Marland Kitchen levothyroxine (SYNTHROID, LEVOTHROID) 100 MCG tablet Take 1 tablet (100 mcg total) by mouth daily.  30 tablet  12  . lipase/protease/amylase (CREON-12/PANCREASE) 12000 UNITS CPEP capsule Take  1 capsule by mouth.      . metoprolol succinate (TOPROL-XL) 50 MG 24 hr tablet Take 50 mg by mouth daily. Take with or immediately following a meal.      . montelukast (SINGULAIR) 10 MG tablet Take 10 mg by mouth at bedtime.      . Multiple Vitamins-Minerals (EYE VITAMINS) CAPS Take by mouth. i caps 2 tabs a day      . polyethylene glycol (MIRALAX / GLYCOLAX) packet Take 17 g by mouth daily as needed.      Marland Kitchen spironolactone (ALDACTONE) 25 MG tablet 25 mg. Take 1/2 tablet by mouth once daily.      . Tiotropium Bromide Monohydrate (SPIRIVA HANDIHALER IN) Inhale 18 mcg into the lungs daily.      . traMADol (ULTRAM) 50 MG tablet Take 50 mg by mouth daily.      . Vitamin D, Ergocalciferol, (DRISDOL) 50000 UNITS CAPS Take 50,000 Units by mouth every 7 (seven) days. Tuesday      . warfarin (COUMADIN) 2 MG tablet Take 1 mg (1/2 tablet) by mouth every day of the week  30 tablet  12   No current facility-administered medications for this visit.    Allergies  Allergen Reactions  . Iodinated Diagnostic Agents     Unknown    Review of Systems negative except from HPI and PMH  Physical Exam BP 98/60  Pulse 89  Ht 5\' 2"  (1.575 m)  Wt 164 lb (74.39 kg)  BMI 29.99 kg/m2  LMP 01/10/2012 Well developed and well nourished in no acute distress HENT normal E scleral and icterus clear Neck Supple JVP flat; carotids brisk and full Clear to ausculation  Regular rate and rhythm, 2/6  murmurs gallops or rub Soft with active bowel sounds No clubbing cyanosis Trace Edema Alert and oriented, grossly normal motor and sensory function Skin Warm and Dry  ECG demonstrates atrial fibrillation with intermittent P. Synchronous pacing coupled with PVCs  Assessment and  Plan  Congestive heart failure-acute on chronic-systolic  Nonischemic cardiomyopathy  Atrial fibrillation-paroxysmal   Hypotension  Hyperkalemia  Renal insufficiency grade 4  The patient has relatively few symptoms of congestive  heart failure manifested by fatigue in the setting of paroxysmal atrial fibrillation of unknown duration. We will plan to undertake cardioversion. She previously was not tolerant of amiodarone because of transaminase elevation which did not totally normalize this remains another option.  The question is also whether we can correlate atrial fibrillation with her symptoms.  Her hyperkalemia mandates discontinuation of her Aldactone.  With her hypotension we will decrease her metoprolol as well as her Avapro.  Addendum. Labs from yesterday demonstrated BUN/creatinine 45/1.6. Her Aldactone  to be discontinued. We'll hold her Lasix. Thankfully her potassium is normal.

## 2013-05-26 NOTE — Patient Instructions (Addendum)
Your physician has recommended you make the following change in your medication:  1) Stop Aldactone 2) Decrease Avapro to 34.5 mg daily (1/2 tablet) 3) Decrease Metoprolol to 25 mg daily   Your physician has recommended that you have a Cardioversion (DCCV). Electrical Cardioversion uses a jolt of electricity to your heart either through paddles or wired patches attached to your chest. This is a controlled, usually prescheduled, procedure. Defibrillation is done under light anesthesia in the hospital, and you usually go home the day of the procedure. This is done to get your heart back into a normal rhythm. You are not awake for the procedure. Please see the instruction sheet given to you today.   Trinidad Curet, RN will you call you to schedule this - [phone]  Your physician wants you to follow-up in: 6 months with device clinic.  You will receive a reminder letter in the mail two months in advance. If you don't receive a letter, please call our office to schedule the follow-up appointment.  Your physician wants you to follow-up in: 1 year with Dr. Caryl Comes.  You will receive a reminder letter in the mail two months in advance. If you don't receive a letter, please call our office to schedule the follow-up appointment.

## 2013-05-27 ENCOUNTER — Other Ambulatory Visit: Payer: Self-pay | Admitting: *Deleted

## 2013-05-27 ENCOUNTER — Telehealth: Payer: Self-pay | Admitting: *Deleted

## 2013-05-27 ENCOUNTER — Encounter: Payer: Self-pay | Admitting: *Deleted

## 2013-05-27 DIAGNOSIS — R799 Abnormal finding of blood chemistry, unspecified: Secondary | ICD-10-CM

## 2013-05-27 NOTE — Telephone Encounter (Signed)
Called pt to schedule DCCV w/ Caryl Comes - however, pre procedure lab results abnormal.  BUN 45, Cr. 1.6 (creatinine seems to be baseline number) Per Dr. Caryl Comes, repeat lab, hold Lasix, hold DCCV for now. Repeat lab scheduled for Monday 3/23. Pt aware to hold Lasix until instructed further. Guaiac cards left at front desk for pick up. Patient verbalized understanding and agreeable to plan.

## 2013-05-27 NOTE — Telephone Encounter (Signed)
Follow up  ° ° ° °Returning call back to nurse  °

## 2013-05-30 ENCOUNTER — Other Ambulatory Visit: Payer: Medicare Other

## 2013-06-02 ENCOUNTER — Other Ambulatory Visit: Payer: Self-pay | Admitting: *Deleted

## 2013-06-02 ENCOUNTER — Other Ambulatory Visit (INDEPENDENT_AMBULATORY_CARE_PROVIDER_SITE_OTHER): Payer: Medicare Other

## 2013-06-02 DIAGNOSIS — R799 Abnormal finding of blood chemistry, unspecified: Secondary | ICD-10-CM

## 2013-06-02 DIAGNOSIS — R899 Unspecified abnormal finding in specimens from other organs, systems and tissues: Secondary | ICD-10-CM

## 2013-06-02 DIAGNOSIS — R7989 Other specified abnormal findings of blood chemistry: Secondary | ICD-10-CM

## 2013-06-02 LAB — BASIC METABOLIC PANEL
BUN: 44 mg/dL — ABNORMAL HIGH (ref 6–23)
CHLORIDE: 103 meq/L (ref 96–112)
CO2: 23 mEq/L (ref 19–32)
Calcium: 9 mg/dL (ref 8.4–10.5)
Creatinine, Ser: 1.7 mg/dL — ABNORMAL HIGH (ref 0.4–1.2)
GFR: 31.45 mL/min — AB (ref >60.00)
Glucose, Bld: 113 mg/dL — ABNORMAL HIGH (ref 70–99)
POTASSIUM: 4 meq/L (ref 3.5–5.1)
SODIUM: 136 meq/L (ref 135–145)

## 2013-06-02 NOTE — Telephone Encounter (Signed)
Pt's dtr spoke with [place]'s office. Coumadin resumed, and he is aware pt is holding Lasix for next week.

## 2013-06-02 NOTE — Telephone Encounter (Signed)
Follow up    Daughter calling back - checking on blood results from today .    Status of upcoming on Wednesday.

## 2013-06-02 NOTE — Telephone Encounter (Signed)
Spoke with pt and her dtr. Blood work today with no improvement.  Dtr informs me that pt stopped Coumadin 3/16, and has continued double Lasix for 2 weeks now (pt advised 3/17 to stop Lasix temporarily secondary to abnormal labs). Pt advised to talk with Dr. Shon Baton office immediately and resume Coumadin. Will discuss with Caryl Comes about next step in plan of care. Pt and dtr agreeable to plan.

## 2013-06-03 ENCOUNTER — Other Ambulatory Visit: Payer: Self-pay | Admitting: *Deleted

## 2013-06-03 DIAGNOSIS — R899 Unspecified abnormal finding in specimens from other organs, systems and tissues: Secondary | ICD-10-CM

## 2013-06-04 ENCOUNTER — Other Ambulatory Visit: Payer: Medicare Other

## 2013-06-04 ENCOUNTER — Other Ambulatory Visit: Payer: Self-pay | Admitting: *Deleted

## 2013-06-04 ENCOUNTER — Other Ambulatory Visit: Payer: Self-pay | Admitting: Nurse Practitioner

## 2013-06-04 DIAGNOSIS — R799 Abnormal finding of blood chemistry, unspecified: Secondary | ICD-10-CM

## 2013-06-04 DIAGNOSIS — Z7901 Long term (current) use of anticoagulants: Secondary | ICD-10-CM

## 2013-06-04 DIAGNOSIS — D509 Iron deficiency anemia, unspecified: Secondary | ICD-10-CM

## 2013-06-04 DIAGNOSIS — Z1211 Encounter for screening for malignant neoplasm of colon: Secondary | ICD-10-CM

## 2013-06-04 DIAGNOSIS — D649 Anemia, unspecified: Secondary | ICD-10-CM

## 2013-06-04 DIAGNOSIS — K2990 Gastroduodenitis, unspecified, without bleeding: Principal | ICD-10-CM

## 2013-06-04 DIAGNOSIS — K2970 Gastritis, unspecified, without bleeding: Secondary | ICD-10-CM

## 2013-06-04 DIAGNOSIS — I6350 Cerebral infarction due to unspecified occlusion or stenosis of unspecified cerebral artery: Secondary | ICD-10-CM

## 2013-06-04 DIAGNOSIS — Z79899 Other long term (current) drug therapy: Secondary | ICD-10-CM

## 2013-06-04 DIAGNOSIS — R5383 Other fatigue: Secondary | ICD-10-CM

## 2013-06-04 NOTE — Progress Notes (Signed)
poc ordered in error and released.  correct order placed.

## 2013-06-04 NOTE — Telephone Encounter (Signed)
Reviewed with Dr Lovena Le.  Recommended either restarting Lasix at 40 mg a day or going to ED for eval.  Pt will restart Lasix at 40 mg and call back tomorrow if no improvement.  Will forward to Trinidad Curet, RN and Dr Caryl Comes for there knowledge and follow up.

## 2013-06-04 NOTE — Telephone Encounter (Signed)
Pt very SOB last night and today.  Reports she had to sit up to sleep tonight and is SOB with just talking today.  BP 131/73  HR 72.  She was asked to hold Lasix on Monday.  She has not had any yesterday or today d/t elevated BUN and crea (BUN 44 Crea 1.7 on 3/23)  Pt denies any edema but states 1 lb wt gain over night.  She is scheduled for repeat lab on Monday.  Aware I will discuss with DOD and call her back.

## 2013-06-04 NOTE — Telephone Encounter (Signed)
New message    patient went to see Dr. Philip Aspen today for coumadin check.     C/O having trouble breathing last night.  Heavy breathing on phone.

## 2013-06-05 ENCOUNTER — Encounter (HOSPITAL_COMMUNITY): Payer: Self-pay | Admitting: Emergency Medicine

## 2013-06-05 ENCOUNTER — Inpatient Hospital Stay (HOSPITAL_COMMUNITY)
Admission: EM | Admit: 2013-06-05 | Discharge: 2013-06-16 | DRG: 308 | Disposition: A | Discharge status: Home / Self Care | Payer: Medicare Other | Attending: Internal Medicine | Admitting: Internal Medicine

## 2013-06-05 ENCOUNTER — Emergency Department (HOSPITAL_COMMUNITY): Payer: Medicare Other

## 2013-06-05 DIAGNOSIS — N183 Chronic kidney disease, stage 3 unspecified: Secondary | ICD-10-CM | POA: Diagnosis present

## 2013-06-05 DIAGNOSIS — E119 Type 2 diabetes mellitus without complications: Secondary | ICD-10-CM | POA: Diagnosis present

## 2013-06-05 DIAGNOSIS — Z66 Do not resuscitate: Secondary | ICD-10-CM | POA: Diagnosis present

## 2013-06-05 DIAGNOSIS — I959 Hypotension, unspecified: Secondary | ICD-10-CM | POA: Diagnosis present

## 2013-06-05 DIAGNOSIS — E785 Hyperlipidemia, unspecified: Secondary | ICD-10-CM | POA: Diagnosis present

## 2013-06-05 DIAGNOSIS — Z01812 Encounter for preprocedural laboratory examination: Secondary | ICD-10-CM

## 2013-06-05 DIAGNOSIS — I2589 Other forms of chronic ischemic heart disease: Secondary | ICD-10-CM | POA: Diagnosis present

## 2013-06-05 DIAGNOSIS — I255 Ischemic cardiomyopathy: Secondary | ICD-10-CM | POA: Diagnosis present

## 2013-06-05 DIAGNOSIS — M62838 Other muscle spasm: Secondary | ICD-10-CM | POA: Diagnosis not present

## 2013-06-05 DIAGNOSIS — R059 Cough, unspecified: Secondary | ICD-10-CM

## 2013-06-05 DIAGNOSIS — K219 Gastro-esophageal reflux disease without esophagitis: Secondary | ICD-10-CM | POA: Diagnosis present

## 2013-06-05 DIAGNOSIS — N189 Chronic kidney disease, unspecified: Secondary | ICD-10-CM | POA: Diagnosis present

## 2013-06-05 DIAGNOSIS — I129 Hypertensive chronic kidney disease with stage 1 through stage 4 chronic kidney disease, or unspecified chronic kidney disease: Secondary | ICD-10-CM | POA: Diagnosis present

## 2013-06-05 DIAGNOSIS — R531 Weakness: Secondary | ICD-10-CM | POA: Diagnosis present

## 2013-06-05 DIAGNOSIS — Z8673 Personal history of transient ischemic attack (TIA), and cerebral infarction without residual deficits: Secondary | ICD-10-CM

## 2013-06-05 DIAGNOSIS — Z96649 Presence of unspecified artificial hip joint: Secondary | ICD-10-CM

## 2013-06-05 DIAGNOSIS — N179 Acute kidney failure, unspecified: Secondary | ICD-10-CM | POA: Diagnosis present

## 2013-06-05 DIAGNOSIS — Z860100 Personal history of colon polyps, unspecified: Secondary | ICD-10-CM

## 2013-06-05 DIAGNOSIS — G2581 Restless legs syndrome: Secondary | ICD-10-CM | POA: Diagnosis present

## 2013-06-05 DIAGNOSIS — R0902 Hypoxemia: Secondary | ICD-10-CM | POA: Diagnosis present

## 2013-06-05 DIAGNOSIS — K5730 Diverticulosis of large intestine without perforation or abscess without bleeding: Secondary | ICD-10-CM | POA: Diagnosis present

## 2013-06-05 DIAGNOSIS — M1990 Unspecified osteoarthritis, unspecified site: Secondary | ICD-10-CM | POA: Diagnosis present

## 2013-06-05 DIAGNOSIS — F411 Generalized anxiety disorder: Secondary | ICD-10-CM | POA: Diagnosis not present

## 2013-06-05 DIAGNOSIS — I4892 Unspecified atrial flutter: Secondary | ICD-10-CM

## 2013-06-05 DIAGNOSIS — I2789 Other specified pulmonary heart diseases: Secondary | ICD-10-CM | POA: Diagnosis present

## 2013-06-05 DIAGNOSIS — Z7901 Long term (current) use of anticoagulants: Secondary | ICD-10-CM

## 2013-06-05 DIAGNOSIS — I5022 Chronic systolic (congestive) heart failure: Secondary | ICD-10-CM

## 2013-06-05 DIAGNOSIS — Z950 Presence of cardiac pacemaker: Secondary | ICD-10-CM

## 2013-06-05 DIAGNOSIS — I639 Cerebral infarction, unspecified: Secondary | ICD-10-CM

## 2013-06-05 DIAGNOSIS — I4891 Unspecified atrial fibrillation: Principal | ICD-10-CM | POA: Diagnosis present

## 2013-06-05 DIAGNOSIS — I5023 Acute on chronic systolic (congestive) heart failure: Secondary | ICD-10-CM | POA: Diagnosis present

## 2013-06-05 DIAGNOSIS — R05 Cough: Secondary | ICD-10-CM

## 2013-06-05 DIAGNOSIS — J45909 Unspecified asthma, uncomplicated: Secondary | ICD-10-CM | POA: Diagnosis present

## 2013-06-05 DIAGNOSIS — Z515 Encounter for palliative care: Secondary | ICD-10-CM

## 2013-06-05 DIAGNOSIS — D509 Iron deficiency anemia, unspecified: Secondary | ICD-10-CM | POA: Diagnosis present

## 2013-06-05 DIAGNOSIS — I5043 Acute on chronic combined systolic (congestive) and diastolic (congestive) heart failure: Secondary | ICD-10-CM | POA: Diagnosis present

## 2013-06-05 DIAGNOSIS — I272 Pulmonary hypertension, unspecified: Secondary | ICD-10-CM

## 2013-06-05 DIAGNOSIS — Z95810 Presence of automatic (implantable) cardiac defibrillator: Secondary | ICD-10-CM

## 2013-06-05 DIAGNOSIS — I509 Heart failure, unspecified: Secondary | ICD-10-CM

## 2013-06-05 LAB — BASIC METABOLIC PANEL
BUN: 42 mg/dL — AB (ref 6–23)
CO2: 21 meq/L (ref 19–32)
CREATININE: 1.66 mg/dL — AB (ref 0.50–1.10)
Calcium: 9.5 mg/dL (ref 8.4–10.5)
Chloride: 102 mEq/L (ref 96–112)
GFR calc non Af Amer: 27 mL/min — ABNORMAL LOW (ref >90)
GFR, EST AFRICAN AMERICAN: 32 mL/min — AB (ref >90)
Glucose, Bld: 143 mg/dL — ABNORMAL HIGH (ref 70–99)
POTASSIUM: 4.6 meq/L (ref 3.7–5.3)
Sodium: 140 mEq/L (ref 137–147)

## 2013-06-05 LAB — CBC
HEMATOCRIT: 31.6 % — AB (ref 36.0–46.0)
Hemoglobin: 10.4 g/dL — ABNORMAL LOW (ref 12.0–15.0)
MCH: 30.1 pg (ref 26.0–34.0)
MCHC: 32.9 g/dL (ref 30.0–36.0)
MCV: 91.3 fL (ref 78.0–100.0)
Platelets: 272 10*3/uL (ref 150–400)
RBC: 3.46 MIL/uL — AB (ref 3.87–5.11)
RDW: 15.3 % (ref 11.5–15.5)
WBC: 13.5 10*3/uL — AB (ref 4.0–10.5)

## 2013-06-05 LAB — PROTIME-INR
INR: 1.26 (ref 0.00–1.49)
Prothrombin Time: 15.5 seconds — ABNORMAL HIGH (ref 11.6–15.2)

## 2013-06-05 LAB — I-STAT TROPONIN, ED: TROPONIN I, POC: 0.03 ng/mL (ref 0.00–0.08)

## 2013-06-05 LAB — PRO B NATRIURETIC PEPTIDE: PRO B NATRI PEPTIDE: 13216 pg/mL — AB (ref 0–450)

## 2013-06-05 NOTE — ED Notes (Signed)
Pt c/o cough, rib discomfort with cough and increase shortness of breath. States she has been in a fib for a few weeks now. Feels that she cannot get a good breath.

## 2013-06-05 NOTE — ED Notes (Signed)
Pt. arrived with EMS from home reports intermittent palpitations with LUQ onset this evening , denies injury or fall , no nausea or vomitting / denies chest pain . History of AFib / Pacemaker.

## 2013-06-06 ENCOUNTER — Other Ambulatory Visit: Payer: Medicare Other

## 2013-06-06 ENCOUNTER — Encounter: Payer: Self-pay | Admitting: Internal Medicine

## 2013-06-06 ENCOUNTER — Ambulatory Visit: Payer: Medicare Other | Admitting: Internal Medicine

## 2013-06-06 DIAGNOSIS — Z950 Presence of cardiac pacemaker: Secondary | ICD-10-CM

## 2013-06-06 DIAGNOSIS — I059 Rheumatic mitral valve disease, unspecified: Secondary | ICD-10-CM

## 2013-06-06 DIAGNOSIS — D649 Anemia, unspecified: Secondary | ICD-10-CM

## 2013-06-06 DIAGNOSIS — I2589 Other forms of chronic ischemic heart disease: Secondary | ICD-10-CM

## 2013-06-06 DIAGNOSIS — I6350 Cerebral infarction due to unspecified occlusion or stenosis of unspecified cerebral artery: Secondary | ICD-10-CM

## 2013-06-06 DIAGNOSIS — I5023 Acute on chronic systolic (congestive) heart failure: Secondary | ICD-10-CM

## 2013-06-06 DIAGNOSIS — Z7901 Long term (current) use of anticoagulants: Secondary | ICD-10-CM

## 2013-06-06 DIAGNOSIS — I509 Heart failure, unspecified: Secondary | ICD-10-CM

## 2013-06-06 LAB — HEMOCCULT SLIDES (X 3 CARDS)
Fecal Occult Blood: NEGATIVE
OCCULT 1: NEGATIVE
OCCULT 2: NEGATIVE
OCCULT 3: NEGATIVE
OCCULT 4: NEGATIVE
OCCULT 5: NEGATIVE

## 2013-06-06 LAB — PROTIME-INR
INR: 1.38 (ref 0.00–1.49)
Prothrombin Time: 16.6 seconds — ABNORMAL HIGH (ref 11.6–15.2)

## 2013-06-06 LAB — HEPARIN LEVEL (UNFRACTIONATED)
Heparin Unfractionated: 0.29 IU/mL — ABNORMAL LOW (ref 0.30–0.70)
Heparin Unfractionated: 0.48 IU/mL (ref 0.30–0.70)

## 2013-06-06 LAB — MRSA PCR SCREENING: MRSA by PCR: NEGATIVE

## 2013-06-06 MED ORDER — WARFARIN - PHYSICIAN DOSING INPATIENT: Freq: Every day | Status: DC

## 2013-06-06 MED ORDER — HEPARIN (PORCINE) IN NACL 100-0.45 UNIT/ML-% IJ SOLN
1250.0000 [IU]/h | INTRAMUSCULAR | Status: DC
  Administered 2013-06-06 – 2013-06-08 (×3): 1250 [IU]/h via INTRAVENOUS
  Filled 2013-06-06 (×5): qty 250

## 2013-06-06 MED ORDER — OMEGA-3-ACID ETHYL ESTERS 1 G PO CAPS
1.0000 g | ORAL_CAPSULE | Freq: Every day | ORAL | Status: DC
  Administered 2013-06-06 – 2013-06-16 (×11): 1 g via ORAL
  Filled 2013-06-06 (×11): qty 1

## 2013-06-06 MED ORDER — FUROSEMIDE 10 MG/ML IJ SOLN
80.0000 mg | Freq: Two times a day (BID) | INTRAMUSCULAR | Status: DC
  Administered 2013-06-06 – 2013-06-08 (×5): 80 mg via INTRAVENOUS
  Filled 2013-06-06 (×7): qty 8

## 2013-06-06 MED ORDER — IRBESARTAN 75 MG PO TABS
34.5000 mg | ORAL_TABLET | Freq: Every day | ORAL | Status: DC
  Administered 2013-06-06 – 2013-06-15 (×9): 37.5 mg via ORAL
  Filled 2013-06-06 (×11): qty 0.5

## 2013-06-06 MED ORDER — WARFARIN SODIUM 5 MG PO TABS
5.0000 mg | ORAL_TABLET | Freq: Once | ORAL | Status: AC
  Administered 2013-06-06: 5 mg via ORAL
  Filled 2013-06-06: qty 1

## 2013-06-06 MED ORDER — PANTOPRAZOLE SODIUM 40 MG PO TBEC
40.0000 mg | DELAYED_RELEASE_TABLET | Freq: Every day | ORAL | Status: DC
  Administered 2013-06-06 – 2013-06-16 (×11): 40 mg via ORAL
  Filled 2013-06-06 (×10): qty 1

## 2013-06-06 MED ORDER — HEPARIN BOLUS VIA INFUSION
2000.0000 [IU] | Freq: Once | INTRAVENOUS | Status: AC
  Administered 2013-06-06: 2000 [IU] via INTRAVENOUS
  Filled 2013-06-06: qty 2000

## 2013-06-06 MED ORDER — VITAMIN D (ERGOCALCIFEROL) 1.25 MG (50000 UNIT) PO CAPS
50000.0000 [IU] | ORAL_CAPSULE | ORAL | Status: DC
  Administered 2013-06-10: 50000 [IU] via ORAL
  Filled 2013-06-06: qty 1

## 2013-06-06 MED ORDER — ACETAMINOPHEN 325 MG PO TABS
650.0000 mg | ORAL_TABLET | Freq: Four times a day (QID) | ORAL | Status: DC | PRN
  Administered 2013-06-06 – 2013-06-14 (×13): 650 mg via ORAL
  Filled 2013-06-06 (×13): qty 2

## 2013-06-06 MED ORDER — KETOTIFEN FUMARATE 0.025 % OP SOLN
2.0000 [drp] | Freq: Two times a day (BID) | OPHTHALMIC | Status: DC
  Administered 2013-06-06 – 2013-06-16 (×21): 2 [drp] via OPHTHALMIC
  Filled 2013-06-06 (×2): qty 5

## 2013-06-06 MED ORDER — WARFARIN - PHARMACIST DOSING INPATIENT
Freq: Every day | Status: DC
  Administered 2013-06-06 – 2013-06-10 (×3)

## 2013-06-06 MED ORDER — LORATADINE 10 MG PO TABS
10.0000 mg | ORAL_TABLET | Freq: Every day | ORAL | Status: DC
  Administered 2013-06-06 – 2013-06-16 (×11): 10 mg via ORAL
  Filled 2013-06-06 (×11): qty 1

## 2013-06-06 MED ORDER — FUROSEMIDE 10 MG/ML IJ SOLN: 80.0000 mg | Freq: Two times a day (BID) | INTRAMUSCULAR | Status: DC

## 2013-06-06 MED ORDER — METOPROLOL SUCCINATE ER 25 MG PO TB24
25.0000 mg | ORAL_TABLET | Freq: Every day | ORAL | Status: DC
  Filled 2013-06-06: qty 1

## 2013-06-06 MED ORDER — WARFARIN SODIUM 5 MG PO TABS
5.0000 mg | ORAL_TABLET | Freq: Every day | ORAL | Status: DC
  Filled 2013-06-06: qty 1

## 2013-06-06 MED ORDER — FUROSEMIDE 10 MG/ML IJ SOLN
80.0000 mg | Freq: Once | INTRAMUSCULAR | Status: AC
  Administered 2013-06-06: 80 mg via INTRAVENOUS
  Filled 2013-06-06: qty 8

## 2013-06-06 MED ORDER — BUDESONIDE-FORMOTEROL FUMARATE 160-4.5 MCG/ACT IN AERO
2.0000 | INHALATION_SPRAY | Freq: Every day | RESPIRATORY_TRACT | Status: DC
  Administered 2013-06-06 – 2013-06-16 (×10): 2 via RESPIRATORY_TRACT
  Filled 2013-06-06 (×2): qty 6

## 2013-06-06 MED ORDER — SODIUM CHLORIDE 0.9 % IJ SOLN: 3.0000 mL | INTRAMUSCULAR | Status: DC | PRN

## 2013-06-06 MED ORDER — TIOTROPIUM BROMIDE MONOHYDRATE 18 MCG IN CAPS
18.0000 ug | ORAL_CAPSULE | Freq: Every day | RESPIRATORY_TRACT | Status: DC
  Administered 2013-06-06 – 2013-06-16 (×10): 18 ug via RESPIRATORY_TRACT
  Filled 2013-06-06 (×3): qty 5

## 2013-06-06 MED ORDER — CITALOPRAM HYDROBROMIDE 10 MG PO TABS
10.0000 mg | ORAL_TABLET | Freq: Every day | ORAL | Status: DC
  Administered 2013-06-06 – 2013-06-16 (×11): 10 mg via ORAL
  Filled 2013-06-06 (×11): qty 1

## 2013-06-06 MED ORDER — SODIUM CHLORIDE 0.9 % IJ SOLN
3.0000 mL | Freq: Two times a day (BID) | INTRAMUSCULAR | Status: DC
Start: 2013-06-06 — End: 2013-06-16
  Administered 2013-06-06 – 2013-06-10 (×6): 3 mL via INTRAVENOUS

## 2013-06-06 MED ORDER — HEPARIN (PORCINE) IN NACL 100-0.45 UNIT/ML-% IJ SOLN
1100.0000 [IU]/h | INTRAMUSCULAR | Status: DC
  Administered 2013-06-06: 1100 [IU]/h via INTRAVENOUS
  Filled 2013-06-06: qty 250

## 2013-06-06 MED ORDER — SODIUM CHLORIDE 0.9 % IV SOLN
250.0000 mL | INTRAVENOUS | Status: DC | PRN
  Administered 2013-06-06 – 2013-06-13 (×2): 250 mL via INTRAVENOUS

## 2013-06-06 MED ORDER — ROPINIROLE HCL 1 MG PO TABS
1.0000 mg | ORAL_TABLET | Freq: Every day | ORAL | Status: DC
  Administered 2013-06-06 – 2013-06-16 (×11): 1 mg via ORAL
  Filled 2013-06-06 (×11): qty 1

## 2013-06-06 MED ORDER — METOPROLOL SUCCINATE ER 25 MG PO TB24
25.0000 mg | ORAL_TABLET | Freq: Two times a day (BID) | ORAL | Status: DC
  Administered 2013-06-06 – 2013-06-09 (×6): 25 mg via ORAL
  Filled 2013-06-06 (×8): qty 1

## 2013-06-06 NOTE — Progress Notes (Signed)
Echocardiogram 2D Echocardiogram has been performed.  [name] 06/06/2013, 12:13 PM

## 2013-06-06 NOTE — H&P (Addendum)
Patient ID: [name] MRN: [id number], DOB/AGE: 08-19-1926   Admit date: 06/05/2013   Primary Physician: Kristen Lopes, MD Primary Cardiologist: Kristen Axe, MD  Pt. Profile: 80F with NICM (LVEF 15-25%, mod MR 11/13), pAF on warfarin, s/p CRT-D with change out to CRT-P, prior CVA (while off warfarin for procedure) who is admitted with CHF in setting of AF recurrence.   Problem List  Past Medical History  Diagnosis Date  . Benign neoplasm of colon   . Diverticulosis of colon (without mention of hemorrhage)   . Reflux esophagitis   . Gastric polyp     history of  . GERD (gastroesophageal reflux disease)   . Stricture and stenosis of esophagus   . Reactive airway disease   . Anemia, iron deficiency   . CHF (congestive heart failure)   . Restless leg syndrome   . Nonischemic cardiomyopathy     EF 20-25% 01-23-12 / BiV AICD  . Atrial fibrillation   . Diabetes mellitus     controlled with diet  . Osteoarthritis   . Hypertension   . UTI (lower urinary tract infection)   . Pacemaker CRT  BSx   . Chronic kidney disease (CKD), stage III (moderate)   . Hyperlipemia   . Anticoagulated on Coumadin   . Shortness of breath   . Asthma   . Alopecia  2/2 betablockers     Past Surgical History  Procedure Laterality Date  . Cardiac defibrillator placement  1999, 2001, 3.30.2012    [facility], Removal of previously implanted BiV ICD followed by insertion of BiV pacemaker with pacemaker pocket revision. without immediate procedural complications.   . Eye surgery  2002    cataract   . Total hip arthroplasty  2004    right  . Insert / replace / remove pacemaker    . Joint replacement    . Esophagogastroduodenoscopy (egd) with esophageal dilation  01/31/2012    Procedure: ESOPHAGOGASTRODUODENOSCOPY (EGD) WITH ESOPHAGEAL DILATION;  Surgeon: Kristen Banister, MD;  Location: [place];  Service: Endoscopy;  Laterality: N/A;  . Esophagogastroduodenoscopy  02/01/2012   Procedure: ESOPHAGOGASTRODUODENOSCOPY (EGD);  Surgeon: Kristen Banister, MD;  Location: [place];  Service: Endoscopy;  Laterality: N/A;     Allergies  Allergies  Allergen Reactions  . Iodinated Diagnostic Agents     Unknown    HPI  80F with NICM (LVEF 15-25%, mod MR 11/13), pAF on warfarin, s/p CRT-D with change out to CRT-P, prior CVA (while off warfarin for procedure) who is admitted with progressive dyspnea.  [name] notes 2 weeks ago she went back into AF. She consistently feels poorly in AF with fatigue and dyspnea. She took double doses of lasix intimally and DCCV was planned by Dr. Caryl Hoffman but was deferred given a slight increase in Cr to 1.7. Spironolactone was discontinued at the visit with Dr. Caryl Hoffman due to hyperkalemia; avapro and metoprolol were decreased. She was told to hold her lasix but was confused and held her warfarin. In the setting of progressive dyspnea, PND, orthopnea, and a pounding sensation in her chest, she came to the ED.   In ED: HR 110, oxygen sats in mid 90s, BNP 13,216. (Last 3,145 on 05/17/12). CXR without frank pulmonary edema. INR 1.26. Tele and ECG demonstrated AF and frequent ectopic beats. Cardiology consulted for admission.   Home Medications  Prior to Admission medications   Medication Sig Start Date End Date Taking? Authorizing Provider  acetaminophen (TYLENOL) 325 MG tablet Take  650 mg by mouth every 6 (six) hours as needed. For pain/fever   Yes Historical Provider, MD  azelastine (OPTIVAR) 0.05 % ophthalmic solution Place 1 drop into both eyes 2 (two) times daily.   Yes Historical Provider, MD  budesonide-formoterol (SYMBICORT) 160-4.5 MCG/ACT inhaler Inhale 2 puffs into the lungs daily.   Yes Historical Provider, MD  citalopram (CELEXA) 10 MG tablet Take 1 tablet (10 mg total) by mouth daily. 05/21/12  Yes Kristen Battles, MD  desloratadine (CLARINEX) 5 MG tablet Take 5 mg by mouth daily as needed (for allergies).    Yes Historical Provider, MD    fish oil-omega-3 fatty acids 1000 MG capsule Take 1 g by mouth daily.    Yes Historical Provider, MD  furosemide (LASIX) 20 MG tablet Take 40 mg by mouth daily.    Yes Historical Provider, MD  irbesartan (AVAPRO) 75 MG tablet Take 0.5 tablets (37.5 mg total) by mouth daily. 05/26/13  Yes Kristen Sprang, MD  lansoprazole (PREVACID) 30 MG capsule Take 30 mg by mouth daily.   Yes Historical Provider, MD  metoprolol succinate (TOPROL-XL) 25 MG 24 hr tablet Take 1 tablet (25 mg total) by mouth daily. Take with or immediately following a meal. 05/26/13  Yes Kristen Sprang, MD  Multiple Vitamins-Minerals (EYE VITAMINS) CAPS Take by mouth. i caps 2 tabs a day   Yes Historical Provider, MD  Vitamin D, Ergocalciferol, (DRISDOL) 50000 UNITS CAPS Take 50,000 Units by mouth every 7 (seven) days. Tuesday   Yes Historical Provider, MD  warfarin (COUMADIN) 3 MG tablet Take 3 mg by mouth daily.   Yes Historical Provider, MD  rOPINIRole (REQUIP) 1 MG tablet Take 1 mg by mouth daily.    Historical Provider, MD  Tiotropium Bromide Monohydrate (SPIRIVA HANDIHALER IN) Inhale 18 mcg into the lungs daily.    Historical Provider, MD    Family History  No family history on file.  Social History  History   Social History  . Marital Status: Widowed    Spouse Name: N/A    Number of Children: N/A  . Years of Education: N/A   Occupational History  . Not on file.   Social History Main Topics  . Smoking status: Never Smoker   . Smokeless tobacco: Never Used  . Alcohol Use: 3.6 oz/week    6 Shots of liquor per week     Comment: OCCASSIONAL  . Drug Use: No  . Sexual Activity: No   Other Topics Concern  . Not on file   Social History Narrative  . No narrative on file     Review of Systems General:  No chills, fever, night sweats or weight changes.  Cardiovascular:  No chest pain. Dermatological: No rash, lesions/masses Respiratory: + cough with white sputum, + dyspnea Urologic: No hematuria,  dysuria Abdominal:   No nausea, vomiting, diarrhea, bright red blood per rectum, melena, or hematemesis Neurologic:  No visual changes, wkns, changes in mental status. All other systems reviewed and are otherwise negative except as noted above.  Physical Exam  Blood pressure 111/62, pulse 37, resp. rate 16, last menstrual period 01/10/2012, SpO2 100.00%.  General: Pleasant, NAD Psych: Normal affect. Neuro: Alert and oriented X 3. Moves all extremities spontaneously. HEENT: Normal  Neck: JVP to angle of the jaw while seated uprigh. Lungs:  Resp regular and unlabored, bibasilar crackles Heart: Irregular with 3/6 holosystolic murmur loudest at base Abdomen: Soft, non-tender, non-distended, BS + x 4.  Extremities: No clubbing, cyanosis. DP/PT/Radials 2+  and equal bilaterally. Trace edema  Labs  Troponin (Point of Care Test)  Recent Labs  06/05/13 2235  TROPIPOC 0.03   No results found for this basename: CKTOTAL, CKMB, TROPONINI,  in the last 72 hours Lab Results  Component Value Date   WBC 13.5* 06/05/2013   HGB 10.4* 06/05/2013   HCT 31.6* 06/05/2013   MCV 91.3 06/05/2013   PLT 272 06/05/2013    Recent Labs Lab 06/05/13 2220  NA 140  K 4.6  CL 102  CO2 21  BUN 42*  CREATININE 1.66*  CALCIUM 9.5  GLUCOSE 143*   Lab Results  Component Value Date   CHOL 241* 02/06/2012   HDL 19* 02/06/2012   LDLCALC 193* 02/06/2012   TRIG 143 02/06/2012   No results found for this basename: DDIMER     Radiology/Studies  Dg Chest 2 View  06/06/2013   CLINICAL DATA:  Atrial fibrillation. Shortness of breath. Cough and congestion.  EXAM: CHEST  2 VIEW  COMPARISON:  Chest x-ray 05/16/2012.  FINDINGS: Lung volumes are normal. No consolidative airspace disease. No pleural effusions. No evidence of pulmonary edema. Mild pulmonary venous congestion. Mild cardiomegaly. Atherosclerosis in the thoracic aorta. Left-sided biventricular pacemaker/AICD with lead tips projecting over the expected  location of the right atrium, overlying the lateral wall of the left ventricle via the coronary sinus and coronary veins, and in the right ventricular apex. Small calcified nodule in the lateral aspect of the right mid lung, similar to numerous prior examinations, most compatible with a calcified granuloma. Multiple old vertebral body compression fractures throughout the mid to lower thoracic spine, unchanged compared to prior study 06/10/2010.  IMPRESSION: 1. Mild cardiomegaly with mild pulmonary venous congestion, but no frank pulmonary edema. 2. Atherosclerosis.   Electronically Signed   By: Vinnie Langton M.D.   On: 06/06/2013 00:00   TTE 02/01/2012 - Left ventricle: LVEF is severely depressed at approximately 15 to 25% with akinesisinferoseptum, posterior wall, inferior wall and apex; severe hypokinesis elsewhere. The cavity size was severely dilated. Wall thickness was normal. - Aortic valve: Mild regurgitation. - Mitral valve: Moderate regurgitation. - Left atrium: Suspicious for small PFO by color doppler The atrium was moderately to severely dilated. - Pulmonary arteries: PA peak pressure: 50mm Hg (S).   ECG AF, BiVP complexes and unpaced (native vs. PVC)  There are occasion pacing spikes during repolarization?   ASSESSMENT AND PLAN  44F with NICM (LVEF 15-25%, mod MR 11/13), pAF on warfarin, s/p CRT-D with change out to CRT-P, prior CVA (while off warfarin for procedure) who is admitted with CHF in setting of AF, PVCs, and decreased %BiVP  1. Acute on chronic acute systolic HF in setting of  Recurrent AF, PVCs, and I presumed decreased %BiVP. Amio would be a great drug here but she has unfortunately had intolerance to this in the past (increased LFTs). She is wet but appears to be perfusing okay - at least at this point in time. I worry that if PVC burden increases further she could have issue with low output.  BB does was recently decreased -- this may be a precipitating factor. -  diurese with 80mg  IV lasix BID - Continue irbesartan - Continue metoprolol - Would favor TEE/DCCV once more euvolemic - Will consider lidocaine for PVC suppression acutely if having issues diuresing. Suspect will get better with HF tx. Ultimately going up on BB may be beneficial but it is not a great idea to titrate in the acute setting.  -  TTE  2. AF. Off AC due to mistake. CHADSVASC=9 - Heparin to warfarin bridge - would plan for TEE/DCCV once more euvolemic - K>4, Mg>2  3. S/p CRT-P - would interrogate device in AM to look for: %BiVP, AF burden, PVC burden, and to sort out why she is having what appears to be inappropriate pacing  Confirmed DNR  Signed, Lamar Sprinkles, MD 06/06/2013, 1:04 AM

## 2013-06-06 NOTE — Progress Notes (Signed)
ANTICOAGULATION CONSULT NOTE - Initial Consult  Pharmacy Consult for heparin, Coumadin Indication: atrial fibrillation  Allergies  Allergen Reactions  . Iodinated Diagnostic Agents     Unknown    Patient Measurements: Height: 5\' 2"  (157.5 cm) Weight: 163 lb 12.8 oz (74.3 kg) IBW/kg (Calculated) : 50.1 Heparin Dosing Weight: 65kg  Vital Signs: Temp: 97.3 F (36.3 C) (03/27 1142) Temp src: Oral (03/27 1142) BP: 118/76 mmHg (03/27 1200) Pulse Rate: 74 (03/27 1200)  Labs:  Recent Labs  06/05/13 2220 06/06/13 0530 06/06/13 1243  HGB 10.4*  --   --   HCT 31.6*  --   --   PLT 272  --   --   LABPROT 15.5* 16.6*  --   INR 1.26 1.38  --   HEPARINUNFRC  --   --  0.29*  CREATININE 1.66*  --   --     Estimated Creatinine Clearance: 23.8 ml/min (by C-G formula based on Cr of 1.66).   Medical History: Past Medical History  Diagnosis Date  . Benign neoplasm of colon   . Diverticulosis of colon (without mention of hemorrhage)   . Reflux esophagitis   . Gastric polyp     history of  . GERD (gastroesophageal reflux disease)   . Stricture and stenosis of esophagus   . Reactive airway disease   . Anemia, iron deficiency   . CHF (congestive heart failure)   . Restless leg syndrome   . Nonischemic cardiomyopathy     EF 20-25% 01-23-12 / BiV AICD  . Atrial fibrillation   . Diabetes mellitus     controlled with diet  . Osteoarthritis   . Hypertension   . UTI (lower urinary tract infection)   . Pacemaker CRT  BSx   . Chronic kidney disease (CKD), stage III (moderate)   . Hyperlipemia   . Anticoagulated on Coumadin   . Shortness of breath   . Asthma   . Alopecia  2/2 betablockers     Medications:  Prescriptions prior to admission  Medication Sig Dispense Refill  . acetaminophen (TYLENOL) 325 MG tablet Take 650 mg by mouth every 6 (six) hours as needed. For pain/fever      . azelastine (OPTIVAR) 0.05 % ophthalmic solution Place 1 drop into both eyes 2 (two) times  daily.      . budesonide-formoterol (SYMBICORT) 160-4.5 MCG/ACT inhaler Inhale 2 puffs into the lungs daily.      . citalopram (CELEXA) 10 MG tablet Take 1 tablet (10 mg total) by mouth daily.  30 tablet  12  . desloratadine (CLARINEX) 5 MG tablet Take 5 mg by mouth daily as needed (for allergies).       . fish oil-omega-3 fatty acids 1000 MG capsule Take 1 g by mouth daily.       . furosemide (LASIX) 20 MG tablet Take 40 mg by mouth daily.       . irbesartan (AVAPRO) 75 MG tablet Take 0.5 tablets (37.5 mg total) by mouth daily.  15 tablet  6  . lansoprazole (PREVACID) 30 MG capsule Take 30 mg by mouth daily.      . metoprolol succinate (TOPROL-XL) 25 MG 24 hr tablet Take 1 tablet (25 mg total) by mouth daily. Take with or immediately following a meal.  30 tablet  11  . Multiple Vitamins-Minerals (EYE VITAMINS) CAPS Take by mouth. i caps 2 tabs a day      . Vitamin D, Ergocalciferol, (DRISDOL) 50000 UNITS CAPS Take 50,000 Units  by mouth every 7 (seven) days. Tuesday      . warfarin (COUMADIN) 3 MG tablet Take 3 mg by mouth daily.      Marland Kitchen rOPINIRole (REQUIP) 1 MG tablet Take 1 mg by mouth daily.      . Tiotropium Bromide Monohydrate (SPIRIVA HANDIHALER IN) Inhale 18 mcg into the lungs daily.       Scheduled:  . budesonide-formoterol  2 puff Inhalation Daily  . citalopram  10 mg Oral Daily  . furosemide  80 mg Intravenous BID  . irbesartan  37.5 mg Oral Daily  . ketotifen  2 drop Both Eyes BID  . loratadine  10 mg Oral Daily  . metoprolol succinate  25 mg Oral BID  . omega-3 acid ethyl esters  1 g Oral Daily  . pantoprazole  40 mg Oral Daily  . rOPINIRole  1 mg Oral Daily  . sodium chloride  3 mL Intravenous Q12H  . tiotropium  18 mcg Inhalation Daily  . [START ON 06/10/2013] Vitamin D (Ergocalciferol)  50,000 Units Oral Q Tue  . warfarin  5 mg Oral q1800  . Warfarin - Pharmacist Dosing Inpatient   Does not apply q1800    Assessment: 80 year-old female on Coumadin prior-to-admission for  Afib. Admitted with a subtherapeutic INR of 1.26 secondary to the patient accidentally holding her Coumadin when she was instructed to hold her Lasix. Pharmacy was consulted to dose Coumadin with a heparin bridge until INR >2. Patient has a tentative cardioversion planned for Monday conditional on TEE results.   First heparin level was slightly subtherapeutic (0.29) on heparin 1100 units/hour.   Hgb 10.4, platelets 272. No bleeding noted.   PTA Coumadin regimen: 3 mg daily   Goal of Therapy:  Heparin level 0.3-0.7 units/ml Monitor platelets by anticoagulation protocol: Yes   Plan:  1. Increase heparin to 1250 units/hour 2. Check 8-hour heparin level at 2200 3. Check heparin level daily and CBC daily while on heparin  4. Give Coumadin 5 mg PO x 1 tonight 5. Check INR daily   Silas [place], PharmD Candidate   06/06/2013,1:36 PM  I agree with the above.   Nena Jordan, PharmD, BCPS 06/06/2013, 1:58 PM

## 2013-06-06 NOTE — Progress Notes (Signed)
ANTICOAGULATION CONSULT NOTE - Initial Consult  Pharmacy Consult for heparin Indication: atrial fibrillation  Allergies  Allergen Reactions  . Iodinated Diagnostic Agents     Unknown    Patient Measurements: Height: 5\' 2"  (157.5 cm) Weight: 163 lb 12.8 oz (74.3 kg) IBW/kg (Calculated) : 50.1 Heparin Dosing Weight: 65kg  Vital Signs: BP: 129/96 mmHg (03/27 0230) Pulse Rate: 35 (03/27 0230)  Labs:  Recent Labs  06/05/13 2220  HGB 10.4*  HCT 31.6*  PLT 272  LABPROT 15.5*  INR 1.26  CREATININE 1.66*    Estimated Creatinine Clearance: 23.8 ml/min (by C-G formula based on Cr of 1.66).   Medical History: Past Medical History  Diagnosis Date  . Benign neoplasm of colon   . Diverticulosis of colon (without mention of hemorrhage)   . Reflux esophagitis   . Gastric polyp     history of  . GERD (gastroesophageal reflux disease)   . Stricture and stenosis of esophagus   . Reactive airway disease   . Anemia, iron deficiency   . CHF (congestive heart failure)   . Restless leg syndrome   . Nonischemic cardiomyopathy     EF 20-25% 01-23-12 / BiV AICD  . Atrial fibrillation   . Diabetes mellitus     controlled with diet  . Osteoarthritis   . Hypertension   . UTI (lower urinary tract infection)   . Pacemaker CRT  BSx   . Chronic kidney disease (CKD), stage III (moderate)   . Hyperlipemia   . Anticoagulated on Coumadin   . Shortness of breath   . Asthma   . Alopecia  2/2 betablockers     Medications:  Prescriptions prior to admission  Medication Sig Dispense Refill  . acetaminophen (TYLENOL) 325 MG tablet Take 650 mg by mouth every 6 (six) hours as needed. For pain/fever      . azelastine (OPTIVAR) 0.05 % ophthalmic solution Place 1 drop into both eyes 2 (two) times daily.      . budesonide-formoterol (SYMBICORT) 160-4.5 MCG/ACT inhaler Inhale 2 puffs into the lungs daily.      . citalopram (CELEXA) 10 MG tablet Take 1 tablet (10 mg total) by mouth daily.  30  tablet  12  . desloratadine (CLARINEX) 5 MG tablet Take 5 mg by mouth daily as needed (for allergies).       . fish oil-omega-3 fatty acids 1000 MG capsule Take 1 g by mouth daily.       . furosemide (LASIX) 20 MG tablet Take 40 mg by mouth daily.       . irbesartan (AVAPRO) 75 MG tablet Take 0.5 tablets (37.5 mg total) by mouth daily.  15 tablet  6  . lansoprazole (PREVACID) 30 MG capsule Take 30 mg by mouth daily.      . metoprolol succinate (TOPROL-XL) 25 MG 24 hr tablet Take 1 tablet (25 mg total) by mouth daily. Take with or immediately following a meal.  30 tablet  11  . Multiple Vitamins-Minerals (EYE VITAMINS) CAPS Take by mouth. i caps 2 tabs a day      . Vitamin D, Ergocalciferol, (DRISDOL) 50000 UNITS CAPS Take 50,000 Units by mouth every 7 (seven) days. Tuesday      . warfarin (COUMADIN) 3 MG tablet Take 3 mg by mouth daily.      Marland Kitchen rOPINIRole (REQUIP) 1 MG tablet Take 1 mg by mouth daily.      . Tiotropium Bromide Monohydrate (SPIRIVA HANDIHALER IN) Inhale 18 mcg into the  lungs daily.       Scheduled:  . budesonide-formoterol  2 puff Inhalation Daily  . citalopram  10 mg Oral Daily  . furosemide  80 mg Intravenous BID  . irbesartan  37.5 mg Oral Daily  . ketotifen  2 drop Both Eyes BID  . loratadine  10 mg Oral Daily  . metoprolol succinate  25 mg Oral Daily  . omega-3 acid ethyl esters  1 g Oral Daily  . pantoprazole  40 mg Oral Daily  . rOPINIRole  1 mg Oral Daily  . sodium chloride  3 mL Intravenous Q12H  . tiotropium  18 mcg Inhalation Daily  . [START ON 06/10/2013] Vitamin D (Ergocalciferol)  50,000 Units Oral Q Tue  . warfarin  5 mg Oral q1800  . Warfarin - Physician Dosing Inpatient   Does not apply q1800    Assessment: 80yo female c/o intermittent palpitations as well as cough w/ rib discomfort and SOB, found to be in Afib w/ low INR despite high CHADS2 score and h/o CVA while Coumadin was on hold for procedure, to begin heparin bridge.  Goal of Therapy:  Heparin  level 0.3-0.7 units/ml Monitor platelets by anticoagulation protocol: Yes   Plan:  Will give heparin 2000 units IV bolus x1 followed by gtt at 1100 units/hr (based on prior admissions) and monitor heparin levels and CBC.  Wynona Neat, PharmD, BCPS  06/06/2013,3:53 AM

## 2013-06-06 NOTE — Progress Notes (Signed)
Patient Name: [name] Date of Encounter: 06/06/2013  Active Problems:   Acute on chronic systolic heart failure   Length of Stay: 1  SUBJECTIVE Still very dyspneic at rest, but looks fairly comfortable at 45 degree HOB elevation. No angina.   CURRENT MEDS . budesonide-formoterol  2 puff Inhalation Daily  . citalopram  10 mg Oral Daily  . furosemide  80 mg Intravenous BID  . irbesartan  37.5 mg Oral Daily  . ketotifen  2 drop Both Eyes BID  . loratadine  10 mg Oral Daily  . metoprolol succinate  25 mg Oral Daily  . omega-3 acid ethyl esters  1 g Oral Daily  . pantoprazole  40 mg Oral Daily  . rOPINIRole  1 mg Oral Daily  . sodium chloride  3 mL Intravenous Q12H  . tiotropium  18 mcg Inhalation Daily  . [START ON 06/10/2013] Vitamin D (Ergocalciferol)  50,000 Units Oral Q Tue  . warfarin  5 mg Oral q1800  . Warfarin - Physician Dosing Inpatient   Does not apply q1800    OBJECTIVE   Intake/Output Summary (Last 24 hours) at 06/06/13 0908 Last data filed at 06/06/13 0700  Gross per 24 hour  Intake     63 ml  Output    150 ml  Net    -87 ml   Filed Weights   06/06/13 0346  Weight: 74.3 kg (163 lb 12.8 oz)    PHYSICAL EXAM Filed Vitals:   06/06/13 0400 06/06/13 0500 06/06/13 0600 06/06/13 0755  BP: 111/21 114/47 129/75 119/75  Pulse: 35 71 41 98  Temp:    98 F (36.7 C)  TempSrc:    Oral  Resp: 21 22 20 23   Height:      Weight:      SpO2: 97% 100% 98% 100%   General: Alert, oriented x3, no distress Head: no evidence of trauma, PERRL, EOMI, no exophtalmos or lid lag, no myxedema, no xanthelasma; normal ears, nose and oropharynx Neck: normal jugular venous pulsations and no hepatojugular reflux; brisk carotid pulses without delay and no carotid bruits Chest: clear to auscultation, no signs of consolidation by percussion or palpation, normal fremitus, symmetrical and full respiratory excursions Cardiovascular: normal position and quality of the apical  impulse, irregular rhythm, normal first and paradoxically split second heart sounds, no rubs or gallops, 1/6 holosystolic murmur Abdomen: no tenderness or distention, no masses by palpation, no abnormal pulsatility or arterial bruits, normal bowel sounds, no hepatosplenomegaly Extremities: no clubbing, cyanosis or edema; 2+ radial, ulnar and brachial pulses bilaterally; 2+ right femoral, posterior tibial and dorsalis pedis pulses; 2+ left femoral, posterior tibial and dorsalis pedis pulses; no subclavian or femoral bruits Neurological: grossly nonfocal  LABS  CBC  Recent Labs  06/05/13 2220  WBC 13.5*  HGB 10.4*  HCT 31.6*  MCV 91.3  PLT 175   Basic Metabolic Panel  Recent Labs  06/05/13 2220  NA 140  K 4.6  CL 102  CO2 21  GLUCOSE 143*  BUN 42*  CREATININE 1.66*  CALCIUM 9.5    Radiology Studies Imaging results have been reviewed and Dg Chest 2 View  06/06/2013   CLINICAL DATA:  Atrial fibrillation. Shortness of breath. Cough and congestion.  EXAM: CHEST  2 VIEW  COMPARISON:  Chest x-ray 05/16/2012.  FINDINGS: Lung volumes are normal. No consolidative airspace disease. No pleural effusions. No evidence of pulmonary edema. Mild pulmonary venous congestion. Mild cardiomegaly. Atherosclerosis in the thoracic aorta. Left-sided biventricular  pacemaker/AICD with lead tips projecting over the expected location of the right atrium, overlying the lateral wall of the left ventricle via the coronary sinus and coronary veins, and in the right ventricular apex. Small calcified nodule in the lateral aspect of the right mid lung, similar to numerous prior examinations, most compatible with a calcified granuloma. Multiple old vertebral body compression fractures throughout the mid to lower thoracic spine, unchanged compared to prior study 06/10/2010.  IMPRESSION: 1. Mild cardiomegaly with mild pulmonary venous congestion, but no frank pulmonary edema. 2. Atherosclerosis.   Electronically Signed    By: Vinnie Langton M.D.   On: 06/06/2013 00:00    TELE AF, roughly 70% ventricular pacing. Appears to have intermittent atrial undersensing and failure to mode switch as well as incessant PVCs and ventricular couplets   ECG AF with very frequent PVCs, BiV pacing; clear atrial undersensing. Some spikes concerning for ventricular failure to capture, but device interrogation shows normal and consistent RV and LV lead capture and other parameters  Underlying rhythm is AF with very slow VR and frequent PVCs  ASSESSMENT AND PLAN Acute on chronic combined systolic and diastolic heart failure, likely related to loss of BiV pacing due to undersensing of persistent AF and very frequent PVCs  Reduced atrial sensitivity threshold with apparent improvement in consistency of AF detection. Increased ventricular rate during mode switch to 75 bpm. Diuresis. Consider cardioversion. Will need TEE first due to subtherapeutic anticoagulation. Tentatively schedule for Monday.   Sanda Klein, MD, [facility] [phone] office [phone] pager 06/06/2013 9:08 AM

## 2013-06-06 NOTE — ED Provider Notes (Signed)
CSN: [id number]     Arrival date & time 06/05/13  2203 History   First MD Initiated Contact with Patient 06/05/13 2304     Chief Complaint  Patient presents with  . Palpitations  . Abdominal Pain      HPI Pt reports increasing SOB and orthopnea over the past 5-6 days.  History congestive heart failure with EF of 15-20%.  Also with moderate pulmonary hypertension.  Patient is on Coumadin. She de currently at rest.nies fevers or chills.  No productive cough.  She has had some cough he reports left-sided chest pain only when she coughs.  No active chest pain this time.  She feels dyspneic currently at rest.  Symptoms are moderate to severe in severity.  Nothing improves her symptoms.  She was recently started back on her Lasix which she states has helped some but not completely resolved her symptoms.     Past Medical History  Diagnosis Date  . Benign neoplasm of colon   . Diverticulosis of colon (without mention of hemorrhage)   . Reflux esophagitis   . Gastric polyp     history of  . GERD (gastroesophageal reflux disease)   . Stricture and stenosis of esophagus   . Reactive airway disease   . Anemia, iron deficiency   . CHF (congestive heart failure)   . Restless leg syndrome   . Nonischemic cardiomyopathy     EF 20-25% 01-23-12 / BiV AICD  . Atrial fibrillation   . Diabetes mellitus     controlled with diet  . Osteoarthritis   . Hypertension   . UTI (lower urinary tract infection)   . Pacemaker CRT  BSx   . Chronic kidney disease (CKD), stage III (moderate)   . Hyperlipemia   . Anticoagulated on Coumadin   . Shortness of breath   . Asthma   . Alopecia  2/2 betablockers    Past Surgical History  Procedure Laterality Date  . Cardiac defibrillator placement  1999, 2001, 3.30.2012    [facility], Removal of previously implanted BiV ICD followed by insertion of BiV pacemaker with pacemaker pocket revision. without immediate procedural complications.   . Eye surgery   2002    cataract   . Total hip arthroplasty  2004    right  . Insert / replace / remove pacemaker    . Joint replacement    . Esophagogastroduodenoscopy (egd) with esophageal dilation  01/31/2012    Procedure: ESOPHAGOGASTRODUODENOSCOPY (EGD) WITH ESOPHAGEAL DILATION;  Surgeon: Milus Banister, MD;  Location: [place];  Service: Endoscopy;  Laterality: N/A;  . Esophagogastroduodenoscopy  02/01/2012    Procedure: ESOPHAGOGASTRODUODENOSCOPY (EGD);  Surgeon: Milus Banister, MD;  Location: [place];  Service: Endoscopy;  Laterality: N/A;   No family history on file. History  Substance Use Topics  . Smoking status: Never Smoker   . Smokeless tobacco: Never Used  . Alcohol Use: 3.6 oz/week    6 Shots of liquor per week     Comment: OCCASSIONAL   OB History   Grav Para Term Preterm Abortions TAB SAB Ect Mult Living                 Review of Systems  All other systems reviewed and are negative.      Allergies  Iodinated diagnostic agents  Home Medications   Current Outpatient Rx  Name  Route  Sig  Dispense  Refill  . acetaminophen (TYLENOL) 325 MG tablet   Oral   Take 650  mg by mouth every 6 (six) hours as needed. For pain/fever         . azelastine (OPTIVAR) 0.05 % ophthalmic solution   Both Eyes   Place 1 drop into both eyes 2 (two) times daily.         . budesonide-formoterol (SYMBICORT) 160-4.5 MCG/ACT inhaler   Inhalation   Inhale 2 puffs into the lungs daily.         . citalopram (CELEXA) 10 MG tablet   Oral   Take 1 tablet (10 mg total) by mouth daily.   30 tablet   12   . desloratadine (CLARINEX) 5 MG tablet   Oral   Take 5 mg by mouth daily as needed (for allergies).          . fish oil-omega-3 fatty acids 1000 MG capsule   Oral   Take 1 g by mouth daily.          . furosemide (LASIX) 20 MG tablet   Oral   Take 40 mg by mouth daily.          . irbesartan (AVAPRO) 75 MG tablet   Oral   Take 0.5 tablets (37.5 mg total) by  mouth daily.   15 tablet   6   . lansoprazole (PREVACID) 30 MG capsule   Oral   Take 30 mg by mouth daily.         . metoprolol succinate (TOPROL-XL) 25 MG 24 hr tablet   Oral   Take 1 tablet (25 mg total) by mouth daily. Take with or immediately following a meal.   30 tablet   11   . Multiple Vitamins-Minerals (EYE VITAMINS) CAPS   Oral   Take by mouth. i caps 2 tabs a day         . Vitamin D, Ergocalciferol, (DRISDOL) 50000 UNITS CAPS   Oral   Take 50,000 Units by mouth every 7 (seven) days. Tuesday         . warfarin (COUMADIN) 3 MG tablet   Oral   Take 3 mg by mouth daily.         Marland Kitchen rOPINIRole (REQUIP) 1 MG tablet   Oral   Take 1 mg by mouth daily.         . Tiotropium Bromide Monohydrate (SPIRIVA HANDIHALER IN)   Inhalation   Inhale 18 mcg into the lungs daily.          BP 116/78  Pulse 89  Resp 13  SpO2 100%  LMP 01/10/2012 Physical Exam  Nursing note and vitals reviewed. Constitutional: She is oriented to person, place, and time. She appears well-developed and well-nourished. No distress.  HENT:  Head: Normocephalic and atraumatic.  Eyes: EOM are normal.  Neck: Normal range of motion.  Cardiovascular: Normal rate, regular rhythm and normal heart sounds.   Pulmonary/Chest:  Tachypnea.  No accessory muscle use  Abdominal: Soft. She exhibits no distension. There is no tenderness.  Musculoskeletal: Normal range of motion.  Neurological: She is alert and oriented to person, place, and time.  Skin: Skin is warm and dry.  Psychiatric: She has a normal mood and affect. Judgment normal.    ED Course  Procedures (including critical care time) Labs Review Labs Reviewed  CBC - Abnormal; Notable for the following:    WBC 13.5 (*)    RBC 3.46 (*)    Hemoglobin 10.4 (*)    HCT 31.6 (*)    All other components within normal limits  BASIC METABOLIC PANEL - Abnormal; Notable for the following:    Glucose, Bld 143 (*)    BUN 42 (*)    Creatinine,  Ser 1.66 (*)    GFR calc non Af Amer 27 (*)    GFR calc Af Amer 32 (*)    All other components within normal limits  PROTIME-INR - Abnormal; Notable for the following:    Prothrombin Time 15.5 (*)    All other components within normal limits  PRO B NATRIURETIC PEPTIDE - Abnormal; Notable for the following:    Pro B Natriuretic peptide (BNP) 13216.0 (*)    All other components within normal limits  Randolm Idol, ED   Imaging Review Dg Chest 2 View  06/06/2013   CLINICAL DATA:  Atrial fibrillation. Shortness of breath. Cough and congestion.  EXAM: CHEST  2 VIEW  COMPARISON:  Chest x-ray 05/16/2012.  FINDINGS: Lung volumes are normal. No consolidative airspace disease. No pleural effusions. No evidence of pulmonary edema. Mild pulmonary venous congestion. Mild cardiomegaly. Atherosclerosis in the thoracic aorta. Left-sided biventricular pacemaker/AICD with lead tips projecting over the expected location of the right atrium, overlying the lateral wall of the left ventricle via the coronary sinus and coronary veins, and in the right ventricular apex. Small calcified nodule in the lateral aspect of the right mid lung, similar to numerous prior examinations, most compatible with a calcified granuloma. Multiple old vertebral body compression fractures throughout the mid to lower thoracic spine, unchanged compared to prior study 06/10/2010.  IMPRESSION: 1. Mild cardiomegaly with mild pulmonary venous congestion, but no frank pulmonary edema. 2. Atherosclerosis.   Electronically Signed   By: Vinnie Langton M.D.   On: 06/06/2013 00:00  I personally reviewed the imaging tests through PACS system I reviewed available ER/hospitalization records through the EMR    EKG Interpretation   Date/Time:  Thursday June 05 2013 22:09:27 EDT Ventricular Rate:  86 PR Interval:    QRS Duration: 162 QT Interval:  468 QTC Calculation: 560 R Axis:   -90 Text Interpretation:  Ventricular-paced rhythm with  frequent AV dual-paced  complexes and with frequent and consecutive Premature ventricular  complexes Biventricular pacemaker detected Abnormal ECG changed from prior  ecg given frequent PVCs Confirmed by Michaelangelo Mittelman  MD, Lennette Bihari ([id number]) on  06/05/2013 11:25:44 PM      MDM   Final diagnoses:  CHF exacerbation  Pulmonary hypertension    CHF exacerbation. IV lasix now   Hoy Morn, MD 06/06/13 [phone]

## 2013-06-06 NOTE — Progress Notes (Signed)
ANTICOAGULATION CONSULT NOTE - Follow Up Consult  Pharmacy Consult for heparin Indication: atrial fibrillation  Labs:  Recent Labs  06/05/13 2220 06/06/13 0530 06/06/13 1243 06/06/13 2216  HGB 10.4*  --   --   --   HCT 31.6*  --   --   --   PLT 272  --   --   --   LABPROT 15.5* 16.6*  --   --   INR 1.26 1.38  --   --   HEPARINUNFRC  --   --  0.29* 0.48  CREATININE 1.66*  --   --   --     Assessment/Plan:  80yo female now therapeutic on heparin after rate increase. Will continue gtt at current rate and confirm stable with am labs.   Wynona Neat, PharmD, BCPS  06/06/2013,11:31 PM

## 2013-06-07 LAB — PROTIME-INR
INR: 1.37 (ref 0.00–1.49)
PROTHROMBIN TIME: 16.5 s — AB (ref 11.6–15.2)

## 2013-06-07 LAB — BASIC METABOLIC PANEL
BUN: 51 mg/dL — ABNORMAL HIGH (ref 6–23)
CO2: 22 mEq/L (ref 19–32)
Calcium: 8.9 mg/dL (ref 8.4–10.5)
Chloride: 99 mEq/L (ref 96–112)
Creatinine, Ser: 1.79 mg/dL — ABNORMAL HIGH (ref 0.50–1.10)
GFR calc non Af Amer: 25 mL/min — ABNORMAL LOW (ref >90)
GFR, EST AFRICAN AMERICAN: 29 mL/min — AB (ref >90)
Glucose, Bld: 104 mg/dL — ABNORMAL HIGH (ref 70–99)
POTASSIUM: 4.7 meq/L (ref 3.7–5.3)
Sodium: 137 mEq/L (ref 137–147)

## 2013-06-07 LAB — HEPARIN LEVEL (UNFRACTIONATED): Heparin Unfractionated: 0.57 IU/mL (ref 0.30–0.70)

## 2013-06-07 LAB — CBC
HCT: 31.2 % — ABNORMAL LOW (ref 36.0–46.0)
Hemoglobin: 10.3 g/dL — ABNORMAL LOW (ref 12.0–15.0)
MCH: 30.7 pg (ref 26.0–34.0)
MCHC: 33 g/dL (ref 30.0–36.0)
MCV: 93.1 fL (ref 78.0–100.0)
PLATELETS: 245 10*3/uL (ref 150–400)
RBC: 3.35 MIL/uL — ABNORMAL LOW (ref 3.87–5.11)
RDW: 15.3 % (ref 11.5–15.5)
WBC: 11.7 10*3/uL — ABNORMAL HIGH (ref 4.0–10.5)

## 2013-06-07 MED ORDER — WARFARIN SODIUM 6 MG PO TABS
6.0000 mg | ORAL_TABLET | Freq: Once | ORAL | Status: AC
  Administered 2013-06-07: 6 mg via ORAL
  Filled 2013-06-07: qty 1

## 2013-06-07 MED ORDER — BENZONATATE 100 MG PO CAPS
200.0000 mg | ORAL_CAPSULE | Freq: Three times a day (TID) | ORAL | Status: DC | PRN
  Administered 2013-06-07 – 2013-06-15 (×15): 200 mg via ORAL
  Filled 2013-06-07 (×17): qty 2

## 2013-06-07 MED ORDER — DICLOFENAC SODIUM 1 % TD GEL
4.0000 g | Freq: Four times a day (QID) | TRANSDERMAL | Status: DC
  Administered 2013-06-07 – 2013-06-15 (×23): 4 g via TOPICAL
  Filled 2013-06-07 (×2): qty 100

## 2013-06-07 NOTE — Progress Notes (Signed)
ANTICOAGULATION CONSULT NOTE - Initial Consult  Pharmacy Consult for heparin, Coumadin Indication: atrial fibrillation  Allergies  Allergen Reactions  . Iodinated Diagnostic Agents     Unknown    Patient Measurements: Height: 5\' 2"  (157.5 cm) Weight: 163 lb 5.8 oz (74.1 kg) IBW/kg (Calculated) : 50.1 Heparin Dosing Weight: 65kg  Vital Signs: Temp: 98.1 F (36.7 C) (03/28 0409) Temp src: Oral (03/28 0409) BP: 106/70 mmHg (03/28 0414) Pulse Rate: 153 (03/28 0414)  Labs:  Recent Labs  06/05/13 2220 06/06/13 0530 06/06/13 1243 06/06/13 2216 06/07/13 0405  HGB 10.4*  --   --   --  10.3*  HCT 31.6*  --   --   --  31.2*  PLT 272  --   --   --  245  LABPROT 15.5* 16.6*  --   --  16.5*  INR 1.26 1.38  --   --  1.37  HEPARINUNFRC  --   --  0.29* 0.48 0.57  CREATININE 1.66*  --   --   --  1.79*    Estimated Creatinine Clearance: 22 ml/min (by C-G formula based on Cr of 1.79).   Medical History: Past Medical History  Diagnosis Date  . Benign neoplasm of colon   . Diverticulosis of colon (without mention of hemorrhage)   . Reflux esophagitis   . Gastric polyp     history of  . GERD (gastroesophageal reflux disease)   . Stricture and stenosis of esophagus   . Reactive airway disease   . Anemia, iron deficiency   . CHF (congestive heart failure)   . Restless leg syndrome   . Nonischemic cardiomyopathy     EF 20-25% 01-23-12 / BiV AICD  . Atrial fibrillation   . Diabetes mellitus     controlled with diet  . Osteoarthritis   . Hypertension   . UTI (lower urinary tract infection)   . Pacemaker CRT  BSx   . Chronic kidney disease (CKD), stage III (moderate)   . Hyperlipemia   . Anticoagulated on Coumadin   . Shortness of breath   . Asthma   . Alopecia  2/2 betablockers     Medications:  Prescriptions prior to admission  Medication Sig Dispense Refill  . acetaminophen (TYLENOL) 325 MG tablet Take 650 mg by mouth every 6 (six) hours as needed. For pain/fever       . azelastine (OPTIVAR) 0.05 % ophthalmic solution Place 1 drop into both eyes 2 (two) times daily.      . budesonide-formoterol (SYMBICORT) 160-4.5 MCG/ACT inhaler Inhale 2 puffs into the lungs daily.      . citalopram (CELEXA) 10 MG tablet Take 1 tablet (10 mg total) by mouth daily.  30 tablet  12  . desloratadine (CLARINEX) 5 MG tablet Take 5 mg by mouth daily as needed (for allergies).       . fish oil-omega-3 fatty acids 1000 MG capsule Take 1 g by mouth daily.       . furosemide (LASIX) 20 MG tablet Take 40 mg by mouth daily.       . irbesartan (AVAPRO) 75 MG tablet Take 0.5 tablets (37.5 mg total) by mouth daily.  15 tablet  6  . lansoprazole (PREVACID) 30 MG capsule Take 30 mg by mouth daily.      . metoprolol succinate (TOPROL-XL) 25 MG 24 hr tablet Take 1 tablet (25 mg total) by mouth daily. Take with or immediately following a meal.  30 tablet  11  .  Multiple Vitamins-Minerals (EYE VITAMINS) CAPS Take by mouth. i caps 2 tabs a day      . Vitamin D, Ergocalciferol, (DRISDOL) 50000 UNITS CAPS Take 50,000 Units by mouth every 7 (seven) days. Tuesday      . warfarin (COUMADIN) 3 MG tablet Take 3 mg by mouth daily.      Marland Kitchen rOPINIRole (REQUIP) 1 MG tablet Take 1 mg by mouth daily.      . Tiotropium Bromide Monohydrate (SPIRIVA HANDIHALER IN) Inhale 18 mcg into the lungs daily.       Scheduled:  . budesonide-formoterol  2 puff Inhalation Daily  . citalopram  10 mg Oral Daily  . furosemide  80 mg Intravenous BID  . irbesartan  37.5 mg Oral Daily  . ketotifen  2 drop Both Eyes BID  . loratadine  10 mg Oral Daily  . metoprolol succinate  25 mg Oral BID  . omega-3 acid ethyl esters  1 g Oral Daily  . pantoprazole  40 mg Oral Daily  . rOPINIRole  1 mg Oral Daily  . sodium chloride  3 mL Intravenous Q12H  . tiotropium  18 mcg Inhalation Daily  . [START ON 06/10/2013] Vitamin D (Ergocalciferol)  50,000 Units Oral Q Tue  . Warfarin - Pharmacist Dosing Inpatient   Does not apply q1800     Assessment: 80 yo F with heart failure on warfarin PTA for afib. Patient admitted with a subtherapeutic INR of 1.26 secondary to accidentally holding her warfarin when instructed to hold Lasix. Pharmacy consulted to dose warfarin with a heparin bridge until INR >2. Patient has a tentative cardioversion planned for Monday conditional on TEE results. This morning's HL has resulted therapeutic at 0.57 (therapeutic x2 draws).  INR has remained low at 1.37 s/p restart with 5mg  last night.  H/H is low but stable, and Plt are wnl.  No reports of bleeding issues.  PTA Coumadin regimen: 3 mg daily   Goal of Therapy: INR 2-3  Heparin level 0.3-0.7 units/ml Monitor platelets by anticoagulation protocol: Yes   Plan:  - continue heparin IV gtt rate at 1250 units/hr - daily HL and CBC - give warfarin PO 6mg  x1 dose tonight - daily INR - monitor for s/s of bleeding  Ovid Curd E. Jacqlyn Larsen, PharmD Clinical Pharmacist - Resident Pager: [phone] Pharmacy: [phone] 06/07/2013 7:52 AM

## 2013-06-07 NOTE — Progress Notes (Signed)
Notified MD about pts cough and request for home medication Voltaren gel.  New orders received.  Will continue to monitor. Saunders Revel T

## 2013-06-07 NOTE — Progress Notes (Signed)
    Subjective:  Mild SOB. Mildly anxious.   Objective:  Vital Signs in the last 24 hours: Temp:  [97.4 F (36.3 C)-98.1 F (36.7 C)] 97.7 F (36.5 C) (03/28 1200) Pulse Rate:  [74-153] 77 (03/28 1200) Resp:  [18-29] 28 (03/28 1200) BP: (84-140)/(43-96) 125/75 mmHg (03/28 1200) SpO2:  [94 %-100 %] 99 % (03/28 1200) Weight:  [163 lb 5.8 oz (74.1 kg)] 163 lb 5.8 oz (74.1 kg) (03/28 0409)  Intake/Output from previous day: 03/27 0701 - 03/28 0700 In: 6222 [P.O.:720; I.V.:531] Out: 425 [Urine:425]   Physical Exam: General: Well developed, elderly in no acute distress. Head:  Normocephalic and atraumatic. Lungs: Clear to auscultation and percussion. Heart:Irreg irreg  No murmur, rubs or gallops.  Abdomen: soft, non-tender, positive bowel sounds. Protuberant Extremities: No clubbing or cyanosis. No edema. Neurologic: Alert and oriented x 3.    Lab Results:  Recent Labs  06/05/13 2220 06/07/13 0405  WBC 13.5* 11.7*  HGB 10.4* 10.3*  PLT 272 245    Recent Labs  06/05/13 2220 06/07/13 0405  NA 140 137  K 4.6 4.7  CL 102 99  CO2 21 22  GLUCOSE 143* 104*  BUN 42* 51*  CREATININE 1.66* 1.79*   No results found for this basename: TROPONINI, CK, MB,  in the last 72 hours Hepatic Function Panel No results found for this basename: PROT, ALBUMIN, AST, ALT, ALKPHOS, BILITOT, BILIDIR, IBILI,  in the last 72 hours No results found for this basename: CHOL,  in the last 72 hours No results found for this basename: PROTIME,  in the last 72 hours  Imaging: Dg Chest 2 View  06/06/2013   CLINICAL DATA:  Atrial fibrillation. Shortness of breath. Cough and congestion.  EXAM: CHEST  2 VIEW  COMPARISON:  Chest x-ray 05/16/2012.  FINDINGS: Lung volumes are normal. No consolidative airspace disease. No pleural effusions. No evidence of pulmonary edema. Mild pulmonary venous congestion. Mild cardiomegaly. Atherosclerosis in the thoracic aorta. Left-sided biventricular pacemaker/AICD  with lead tips projecting over the expected location of the right atrium, overlying the lateral wall of the left ventricle via the coronary sinus and coronary veins, and in the right ventricular apex. Small calcified nodule in the lateral aspect of the right mid lung, similar to numerous prior examinations, most compatible with a calcified granuloma. Multiple old vertebral body compression fractures throughout the mid to lower thoracic spine, unchanged compared to prior study 06/10/2010.  IMPRESSION: 1. Mild cardiomegaly with mild pulmonary venous congestion, but no frank pulmonary edema. 2. Atherosclerosis.   Electronically Signed   By: Vinnie Langton M.D.   On: 06/06/2013 00:00   Personally viewed.   Telemetry: AFIB 82, pacing, ectopy, irreg.  Personally viewed.    Assessment/Plan:  Active Problems:   Acute on chronic systolic heart failure  1) AFIB - does not seem to tolerate very well. She wants to be in normal rhythm.   - Cardioversion/TEE on Monday  2) HF  - Lasix 80 IV BID  - Creat 1.79 from 1.66  - Not very good output recorded yesterday.  - Weight still remains the same.   - Since creat increased, will not increase lasix dose now.   - If we could control the irregularity (ectopy/afib) this would likely improve CO and diuresis.   - ICD  3) CKD 3   - monitor  4) NICM  - EF 15%  5) Moderate MR   Kc Sedlak 06/07/2013, 1:19 PM

## 2013-06-08 DIAGNOSIS — Z5181 Encounter for therapeutic drug level monitoring: Secondary | ICD-10-CM

## 2013-06-08 DIAGNOSIS — N179 Acute kidney failure, unspecified: Secondary | ICD-10-CM

## 2013-06-08 DIAGNOSIS — I4892 Unspecified atrial flutter: Secondary | ICD-10-CM

## 2013-06-08 DIAGNOSIS — N189 Chronic kidney disease, unspecified: Secondary | ICD-10-CM

## 2013-06-08 DIAGNOSIS — Z7901 Long term (current) use of anticoagulants: Secondary | ICD-10-CM

## 2013-06-08 LAB — BASIC METABOLIC PANEL
BUN: 55 mg/dL — ABNORMAL HIGH (ref 6–23)
CHLORIDE: 98 meq/L (ref 96–112)
CO2: 22 mEq/L (ref 19–32)
Calcium: 8.9 mg/dL (ref 8.4–10.5)
Creatinine, Ser: 1.92 mg/dL — ABNORMAL HIGH (ref 0.50–1.10)
GFR calc Af Amer: 26 mL/min — ABNORMAL LOW (ref >90)
GFR calc non Af Amer: 23 mL/min — ABNORMAL LOW (ref >90)
Glucose, Bld: 110 mg/dL — ABNORMAL HIGH (ref 70–99)
Potassium: 4 mEq/L (ref 3.7–5.3)
SODIUM: 136 meq/L — AB (ref 137–147)

## 2013-06-08 LAB — CBC
HEMATOCRIT: 31 % — AB (ref 36.0–46.0)
HEMOGLOBIN: 10.3 g/dL — AB (ref 12.0–15.0)
MCH: 30.4 pg (ref 26.0–34.0)
MCHC: 33.2 g/dL (ref 30.0–36.0)
MCV: 91.4 fL (ref 78.0–100.0)
Platelets: 267 10*3/uL (ref 150–400)
RBC: 3.39 MIL/uL — AB (ref 3.87–5.11)
RDW: 15.2 % (ref 11.5–15.5)
WBC: 13 10*3/uL — ABNORMAL HIGH (ref 4.0–10.5)

## 2013-06-08 LAB — PROTIME-INR
INR: 1.78 — ABNORMAL HIGH (ref 0.00–1.49)
PROTHROMBIN TIME: 20.2 s — AB (ref 11.6–15.2)

## 2013-06-08 LAB — HEPARIN LEVEL (UNFRACTIONATED): Heparin Unfractionated: 0.42 IU/mL (ref 0.30–0.70)

## 2013-06-08 MED ORDER — WARFARIN SODIUM 4 MG PO TABS
4.0000 mg | ORAL_TABLET | Freq: Once | ORAL | Status: AC
  Administered 2013-06-08: 4 mg via ORAL
  Filled 2013-06-08: qty 1

## 2013-06-08 MED ORDER — SODIUM CHLORIDE 0.9 % IV SOLN
INTRAVENOUS | Status: DC
  Administered 2013-06-08 – 2013-06-09 (×2): 20 mL via INTRAVENOUS

## 2013-06-08 MED ORDER — SODIUM CHLORIDE 0.9 % IJ SOLN: 3.0000 mL | INTRAMUSCULAR | Status: DC | PRN

## 2013-06-08 MED ORDER — SODIUM CHLORIDE 0.9 % IV SOLN
250.0000 mL | INTRAVENOUS | Status: DC
Start: 2013-06-08 — End: 2013-06-16

## 2013-06-08 MED ORDER — SODIUM CHLORIDE 0.9 % IJ SOLN
3.0000 mL | Freq: Two times a day (BID) | INTRAMUSCULAR | Status: DC
  Administered 2013-06-08: 12:00:00 via INTRAVENOUS
  Administered 2013-06-08 – 2013-06-13 (×5): 3 mL via INTRAVENOUS

## 2013-06-08 NOTE — Progress Notes (Signed)
ANTICOAGULATION CONSULT NOTE - Follow Up  Pharmacy Consult for heparin, Coumadin Indication: atrial fibrillation  Allergies  Allergen Reactions  . Iodinated Diagnostic Agents     Unknown    Patient Measurements: Height: 5\' 2"  (157.5 cm) Weight: 162 lb 4.1 oz (73.6 kg) (bed scale) IBW/kg (Calculated) : 50.1 Heparin Dosing Weight: 65kg  Vital Signs: Temp: 97.9 F (36.6 C) (03/29 0320) Temp src: Oral (03/29 0320) BP: 105/80 mmHg (03/29 0320) Pulse Rate: 80 (03/28 2200)  Labs:  Recent Labs  06/05/13 2220 06/06/13 0530  06/06/13 2216 06/07/13 0405 06/08/13 0320  HGB 10.4*  --   --   --  10.3* 10.3*  HCT 31.6*  --   --   --  31.2* 31.0*  PLT 272  --   --   --  245 267  LABPROT 15.5* 16.6*  --   --  16.5* 20.2*  INR 1.26 1.38  --   --  1.37 1.78*  HEPARINUNFRC  --   --   < > 0.48 0.57 0.42  CREATININE 1.66*  --   --   --  1.79* 1.92*  < > = values in this interval not displayed.  Estimated Creatinine Clearance: 20.5 ml/min (by C-G formula based on Cr of 1.92).   Medical History: Past Medical History  Diagnosis Date  . Benign neoplasm of colon   . Diverticulosis of colon (without mention of hemorrhage)   . Reflux esophagitis   . Gastric polyp     history of  . GERD (gastroesophageal reflux disease)   . Stricture and stenosis of esophagus   . Reactive airway disease   . Anemia, iron deficiency   . CHF (congestive heart failure)   . Restless leg syndrome   . Nonischemic cardiomyopathy     EF 20-25% 01-23-12 / BiV AICD  . Atrial fibrillation   . Diabetes mellitus     controlled with diet  . Osteoarthritis   . Hypertension   . UTI (lower urinary tract infection)   . Pacemaker CRT  BSx   . Chronic kidney disease (CKD), stage III (moderate)   . Hyperlipemia   . Anticoagulated on Coumadin   . Shortness of breath   . Asthma   . Alopecia  2/2 betablockers     Medications:  Prescriptions prior to admission  Medication Sig Dispense Refill  . acetaminophen  (TYLENOL) 325 MG tablet Take 650 mg by mouth every 6 (six) hours as needed. For pain/fever      . azelastine (OPTIVAR) 0.05 % ophthalmic solution Place 1 drop into both eyes 2 (two) times daily.      . budesonide-formoterol (SYMBICORT) 160-4.5 MCG/ACT inhaler Inhale 2 puffs into the lungs daily.      . citalopram (CELEXA) 10 MG tablet Take 1 tablet (10 mg total) by mouth daily.  30 tablet  12  . desloratadine (CLARINEX) 5 MG tablet Take 5 mg by mouth daily as needed (for allergies).       . fish oil-omega-3 fatty acids 1000 MG capsule Take 1 g by mouth daily.       . furosemide (LASIX) 20 MG tablet Take 40 mg by mouth daily.       . irbesartan (AVAPRO) 75 MG tablet Take 0.5 tablets (37.5 mg total) by mouth daily.  15 tablet  6  . lansoprazole (PREVACID) 30 MG capsule Take 30 mg by mouth daily.      . metoprolol succinate (TOPROL-XL) 25 MG 24 hr tablet Take 1 tablet (  25 mg total) by mouth daily. Take with or immediately following a meal.  30 tablet  11  . Multiple Vitamins-Minerals (EYE VITAMINS) CAPS Take by mouth. i caps 2 tabs a day      . Vitamin D, Ergocalciferol, (DRISDOL) 50000 UNITS CAPS Take 50,000 Units by mouth every 7 (seven) days. Tuesday      . warfarin (COUMADIN) 3 MG tablet Take 3 mg by mouth daily.      Marland Kitchen rOPINIRole (REQUIP) 1 MG tablet Take 1 mg by mouth daily.      . Tiotropium Bromide Monohydrate (SPIRIVA HANDIHALER IN) Inhale 18 mcg into the lungs daily.       Scheduled:  . budesonide-formoterol  2 puff Inhalation Daily  . citalopram  10 mg Oral Daily  . diclofenac sodium  4 g Topical QID  . furosemide  80 mg Intravenous BID  . irbesartan  37.5 mg Oral Daily  . ketotifen  2 drop Both Eyes BID  . loratadine  10 mg Oral Daily  . metoprolol succinate  25 mg Oral BID  . omega-3 acid ethyl esters  1 g Oral Daily  . pantoprazole  40 mg Oral Daily  . rOPINIRole  1 mg Oral Daily  . sodium chloride  3 mL Intravenous Q12H  . tiotropium  18 mcg Inhalation Daily  . [START ON  06/10/2013] Vitamin D (Ergocalciferol)  50,000 Units Oral Q Tue  . Warfarin - Pharmacist Dosing Inpatient   Does not apply q1800    Assessment: 80 yo F with heart failure on warfarin PTA for afib. Patient admitted with a subtherapeutic INR of 1.26 secondary to accidentally holding her warfarin when instructed to hold Lasix. Pharmacy consulted to dose warfarin with a heparin bridge until INR >2. Patient has a tentative cardioversion planned for Monday conditional on TEE results.   This morning's HL has continued to result therapeutic at 0.42.  INR has increased to 1.78 s/p 2 doses of 5mg , then 6mg .  H/H is low but stable, and Plt are wnl.  No reports of bleeding issues.  PTA Coumadin regimen: 3 mg daily   Goal of Therapy: INR 2-3  Heparin level 0.3-0.7 units/ml Monitor platelets by anticoagulation protocol: Yes   Plan:  - continue heparin IV gtt rate at 1250 units/hr - daily HL and CBC - give warfarin PO 4mg  x1 dose tonight - daily INR - monitor for s/s of bleeding  Ovid Curd E. Jacqlyn Larsen, PharmD Clinical Pharmacist - Resident Pager: [phone] Pharmacy: [phone] 06/08/2013 7:26 AM

## 2013-06-08 NOTE — Progress Notes (Signed)
     Subjective:  SOB improved, mild cough. Mildly anxious with cough. Daughter in room.    Objective:  Vital Signs in the last 24 hours: Temp:  [97.4 F (36.3 C)-98.1 F (36.7 C)] 98.1 F (36.7 C) (03/29 0757) Pulse Rate:  [77-83] 80 (03/28 2200) Resp:  [19-28] 25 (03/29 0800) BP: (98-133)/(61-85) 131/75 mmHg (03/29 0800) SpO2:  [97 %-100 %] 100 % (03/29 0800) Weight:  [162 lb 4.1 oz (73.6 kg)] 162 lb 4.1 oz (73.6 kg) (03/29 0500)  Intake/Output from previous day: 03/28 0701 - 03/29 0700 In: 667.5 [P.O.:240; I.V.:427.5] Out: 100 [Urine:100]   Physical Exam: General: Well developed, elderly in no acute distress. Head:  Normocephalic and atraumatic. Lungs: Clear to auscultation and percussion. Heart:Irreg irreg  No murmur, rubs or gallops.  Abdomen: soft, non-tender, positive bowel sounds. Protuberant Extremities: No clubbing or cyanosis. No edema. Neurologic: Alert and oriented x 3.    Lab Results:  Recent Labs  06/07/13 0405 06/08/13 0320  WBC 11.7* 13.0*  HGB 10.3* 10.3*  PLT 245 267    Recent Labs  06/07/13 0405 06/08/13 0320  NA 137 136*  K 4.7 4.0  CL 99 98  CO2 22 22  GLUCOSE 104* 110*  BUN 51* 55*  CREATININE 1.79* 1.92*    Telemetry: AFIB 82, pacing, ectopy, irreg.  Personally viewed.    Assessment/Plan:  Active Problems:   Acute on chronic systolic heart failure    1) AFIB - does not seem to tolerate very well. She wants to be in normal rhythm.   - Cardioversion/TEE on Monday  - orders have been placed  - NPO after midnight  2) HF  - Discontinue Lasix 80 IV BID  - Creat 1.92 from 1.79 from 1.66  - Not very good output recorded yesterday.  - Weight slightly down (161).    - If we could control the irregularity (ectopy/afib) this would likely improve CO and diuresis.   - ICD  - She will need maintenance lasix dose restarted.   3) CKD 3   - monitor  4) NICM  - EF 15%  5) Moderate MR  6) Chronic anticoagulation  - INR sub  tx  - Hep IV  - Warfarin  Marvene Strohm 06/08/2013, 11:32 AM     

## 2013-06-09 ENCOUNTER — Encounter (HOSPITAL_COMMUNITY): Payer: Medicare Other | Admitting: Anesthesiology

## 2013-06-09 ENCOUNTER — Other Ambulatory Visit: Payer: Medicare Other

## 2013-06-09 ENCOUNTER — Inpatient Hospital Stay (HOSPITAL_COMMUNITY): Payer: Medicare Other | Admitting: Anesthesiology

## 2013-06-09 ENCOUNTER — Encounter: Payer: Self-pay | Admitting: Internal Medicine

## 2013-06-09 ENCOUNTER — Encounter (HOSPITAL_COMMUNITY): Payer: Self-pay | Admitting: Anesthesiology

## 2013-06-09 ENCOUNTER — Encounter (HOSPITAL_COMMUNITY)
Admission: EM | Disposition: A | Discharge status: Home / Self Care | Payer: Self-pay | Source: Home / Self Care | Attending: Internal Medicine

## 2013-06-09 DIAGNOSIS — R5381 Other malaise: Secondary | ICD-10-CM

## 2013-06-09 DIAGNOSIS — I4891 Unspecified atrial fibrillation: Principal | ICD-10-CM | POA: Diagnosis present

## 2013-06-09 DIAGNOSIS — R5383 Other fatigue: Secondary | ICD-10-CM

## 2013-06-09 DIAGNOSIS — I369 Nonrheumatic tricuspid valve disorder, unspecified: Secondary | ICD-10-CM

## 2013-06-09 HISTORY — PX: TEE WITHOUT CARDIOVERSION: SHX5443

## 2013-06-09 HISTORY — PX: CARDIOVERSION: SHX1299

## 2013-06-09 LAB — BASIC METABOLIC PANEL
BUN: 56 mg/dL — ABNORMAL HIGH (ref 6–23)
CO2: 24 meq/L (ref 19–32)
Calcium: 8.9 mg/dL (ref 8.4–10.5)
Chloride: 98 mEq/L (ref 96–112)
Creatinine, Ser: 1.99 mg/dL — ABNORMAL HIGH (ref 0.50–1.10)
GFR calc non Af Amer: 22 mL/min — ABNORMAL LOW (ref >90)
GFR, EST AFRICAN AMERICAN: 25 mL/min — AB (ref >90)
Glucose, Bld: 95 mg/dL (ref 70–99)
POTASSIUM: 3.8 meq/L (ref 3.7–5.3)
SODIUM: 138 meq/L (ref 137–147)

## 2013-06-09 LAB — HEPARIN LEVEL (UNFRACTIONATED): Heparin Unfractionated: 0.7 IU/mL (ref 0.30–0.70)

## 2013-06-09 LAB — PROTIME-INR
INR: 2.34 — AB (ref 0.00–1.49)
PROTHROMBIN TIME: 24.9 s — AB (ref 11.6–15.2)

## 2013-06-09 LAB — CBC
HCT: 30.2 % — ABNORMAL LOW (ref 36.0–46.0)
Hemoglobin: 10.1 g/dL — ABNORMAL LOW (ref 12.0–15.0)
MCH: 30.5 pg (ref 26.0–34.0)
MCHC: 33.4 g/dL (ref 30.0–36.0)
MCV: 91.2 fL (ref 78.0–100.0)
PLATELETS: 248 10*3/uL (ref 150–400)
RBC: 3.31 MIL/uL — ABNORMAL LOW (ref 3.87–5.11)
RDW: 15.1 % (ref 11.5–15.5)
WBC: 11.7 10*3/uL — AB (ref 4.0–10.5)

## 2013-06-09 SURGERY — ECHOCARDIOGRAM, TRANSESOPHAGEAL
Anesthesia: Monitor Anesthesia Care

## 2013-06-09 MED ORDER — DIPHENHYDRAMINE HCL 50 MG/ML IJ SOLN
INTRAMUSCULAR | Status: AC
Start: 2013-06-09 — End: 2013-06-09
  Filled 2013-06-09: qty 1

## 2013-06-09 MED ORDER — WARFARIN SODIUM 3 MG PO TABS
3.0000 mg | ORAL_TABLET | Freq: Once | ORAL | Status: AC
  Administered 2013-06-09: 3 mg via ORAL
  Filled 2013-06-09: qty 1

## 2013-06-09 MED ORDER — PHENYLEPHRINE HCL 10 MG/ML IJ SOLN: INTRAMUSCULAR | Status: DC | PRN

## 2013-06-09 MED ORDER — METOPROLOL SUCCINATE ER 25 MG PO TB24
25.0000 mg | ORAL_TABLET | Freq: Once | ORAL | Status: AC
  Administered 2013-06-09: 25 mg via ORAL
  Filled 2013-06-09: qty 1

## 2013-06-09 MED ORDER — PHENYLEPHRINE HCL 10 MG/ML IJ SOLN
INTRAMUSCULAR | Status: DC | PRN
  Administered 2013-06-09: 120 ug via INTRAVENOUS
  Administered 2013-06-09: 60 ug via INTRAVENOUS
  Administered 2013-06-09: 80 ug via INTRAVENOUS

## 2013-06-09 MED ORDER — MIDAZOLAM HCL 5 MG/ML IJ SOLN
INTRAMUSCULAR | Status: AC
  Filled 2013-06-09: qty 2

## 2013-06-09 MED ORDER — PROPOFOL 10 MG/ML IV BOLUS
INTRAVENOUS | Status: DC | PRN
  Administered 2013-06-09: 30 mg via INTRAVENOUS
  Administered 2013-06-09: 20 mg via INTRAVENOUS

## 2013-06-09 MED ORDER — FENTANYL CITRATE 0.05 MG/ML IJ SOLN
INTRAMUSCULAR | Status: AC
  Filled 2013-06-09: qty 2

## 2013-06-09 MED ORDER — METOPROLOL SUCCINATE ER 50 MG PO TB24
50.0000 mg | ORAL_TABLET | Freq: Every day | ORAL | Status: DC
  Administered 2013-06-10 – 2013-06-12 (×3): 50 mg via ORAL
  Filled 2013-06-09 (×4): qty 1

## 2013-06-09 MED ORDER — BUTAMBEN-TETRACAINE-BENZOCAINE 2-2-14 % EX AERO
INHALATION_SPRAY | CUTANEOUS | Status: DC | PRN
  Administered 2013-06-09: 2 via TOPICAL

## 2013-06-09 NOTE — H&P (View-Only) (Signed)
     Subjective:  SOB improved, mild cough. Mildly anxious with cough. Daughter in room.    Objective:  Vital Signs in the last 24 hours: Temp:  [97.4 F (36.3 C)-98.1 F (36.7 C)] 98.1 F (36.7 C) (03/29 0757) Pulse Rate:  [77-83] 80 (03/28 2200) Resp:  [19-28] 25 (03/29 0800) BP: (98-133)/(61-85) 131/75 mmHg (03/29 0800) SpO2:  [97 %-100 %] 100 % (03/29 0800) Weight:  [162 lb 4.1 oz (73.6 kg)] 162 lb 4.1 oz (73.6 kg) (03/29 0500)  Intake/Output from previous day: 03/28 0701 - 03/29 0700 In: 667.5 [P.O.:240; I.V.:427.5] Out: 100 [Urine:100]   Physical Exam: General: Well developed, elderly in no acute distress. Head:  Normocephalic and atraumatic. Lungs: Clear to auscultation and percussion. Heart:Irreg irreg  No murmur, rubs or gallops.  Abdomen: soft, non-tender, positive bowel sounds. Protuberant Extremities: No clubbing or cyanosis. No edema. Neurologic: Alert and oriented x 3.    Lab Results:  Recent Labs  06/07/13 0405 06/08/13 0320  WBC 11.7* 13.0*  HGB 10.3* 10.3*  PLT 245 267    Recent Labs  06/07/13 0405 06/08/13 0320  NA 137 136*  K 4.7 4.0  CL 99 98  CO2 22 22  GLUCOSE 104* 110*  BUN 51* 55*  CREATININE 1.79* 1.92*    Telemetry: AFIB 82, pacing, ectopy, irreg.  Personally viewed.    Assessment/Plan:  Active Problems:   Acute on chronic systolic heart failure    1) AFIB - does not seem to tolerate very well. She wants to be in normal rhythm.   - Cardioversion/TEE on Monday  - orders have been placed  - NPO after midnight  2) HF  - Discontinue Lasix 80 IV BID  - Creat 1.92 from 1.79 from 1.66  - Not very good output recorded yesterday.  - Weight slightly down (161).    - If we could control the irregularity (ectopy/afib) this would likely improve CO and diuresis.   - ICD  - She will need maintenance lasix dose restarted.   3) CKD 3   - monitor  4) NICM  - EF 15%  5) Moderate MR  6) Chronic anticoagulation  - INR sub  tx  - Hep IV  - Warfarin  SKAINS, MARK 06/08/2013, 11:32 AM

## 2013-06-09 NOTE — Interval H&P Note (Signed)
History and Physical Interval Note:  06/09/2013 9:59 AM  [name]  has presented today for surgery, with the diagnosis of AFIB  The various methods of treatment have been discussed with the patient and family. After consideration of risks, benefits and other options for treatment, the patient has consented to  Procedure(s): TRANSESOPHAGEAL ECHOCARDIOGRAM (TEE) (N/A) CARDIOVERSION (N/A) as a surgical intervention .  The patient's history has been reviewed, patient examined, no change in status, stable for surgery.  I have reviewed the patient's chart and labs.  Questions were answered to the patient's satisfaction.     Kirk Ruths

## 2013-06-09 NOTE — CV Procedure (Signed)
See full TEE report in [employer]; patient sedated by anesthesia with diprovan 60 mg IV; procedure terminated  Prematurely due to hypotension and hypoxia; patient given 280 micrograms of neosynephrine during procedure; severe LV dysfunction (EF 15); severe LAE; severe MR and TR; no LAA thrombus. Synchronized DCCV with 120 J resulted in sinus rhythm. No immediate complications. INR therapeutic; continue coumadin. [name]

## 2013-06-09 NOTE — Preoperative (Signed)
Beta Blockers   Reason not to administer Beta Blockers:Not Applicable 

## 2013-06-09 NOTE — Progress Notes (Signed)
  Echocardiogram Echocardiogram Transesophageal has been performed.  Philipp Deputy 06/09/2013, 11:15 AM

## 2013-06-09 NOTE — Anesthesia Postprocedure Evaluation (Signed)
  Anesthesia Post-op Note  Patient: [name]  Procedure(s) Performed: Procedure(s) with comments: TRANSESOPHAGEAL ECHOCARDIOGRAM (TEE) (N/A) CARDIOVERSION (N/A) - 1018 120 j sinus rhythm  Patient Location: PACU and Endoscopy Unit  Anesthesia Type:General  Level of Consciousness: awake, alert  and oriented  Airway and Oxygen Therapy: Patient Spontanous Breathing and Patient connected to nasal cannula oxygen  Post-op Pain: none  Post-op Assessment: Post-op Vital signs reviewed  Post-op Vital Signs: Reviewed and stable  Complications: No apparent anesthesia complications

## 2013-06-09 NOTE — Anesthesia Preprocedure Evaluation (Signed)
Anesthesia Evaluation  Patient identified by MRN, date of birth, ID band Patient awake    Reviewed: Allergy & Precautions, H&P , NPO status , Patient's Chart, lab work & pertinent test results  Airway       Dental   Pulmonary          Cardiovascular hypertension, + Peripheral Vascular Disease and +CHF + dysrhythmias Atrial Fibrillation + pacemaker     Neuro/Psych  Neuromuscular disease    GI/Hepatic GERD-  ,  Endo/Other  diabetes, Type 2  Renal/GU Renal disease     Musculoskeletal   Abdominal   Peds  Hematology  (+) anemia ,   Anesthesia Other Findings   Reproductive/Obstetrics                           Anesthesia Physical Anesthesia Plan  ASA: III  Anesthesia Plan: MAC and General   Post-op Pain Management:    Induction: Intravenous  Airway Management Planned: Mask  Additional Equipment:   Intra-op Plan:   Post-operative Plan:   Informed Consent: I have reviewed the patients History and Physical, chart, labs and discussed the procedure including the risks, benefits and alternatives for the proposed anesthesia with the patient or authorized representative who has indicated his/her understanding and acceptance.     Plan Discussed with:   Anesthesia Plan Comments:         Anesthesia Quick Evaluation

## 2013-06-09 NOTE — Progress Notes (Signed)
ANTICOAGULATION CONSULT NOTE - Follow Up  Pharmacy Consult for Coumadin Indication: atrial fibrillation  Allergies  Allergen Reactions  . Iodinated Diagnostic Agents     Unknown    Patient Measurements: Height: 5\' 2"  (157.5 cm) Weight: 162 lb 11.2 oz (73.8 kg) IBW/kg (Calculated) : 50.1 Heparin Dosing Weight: 65kg  Vital Signs: Temp: 97.6 F (36.4 C) (03/30 0752) Temp src: Oral (03/30 0752) BP: 119/74 mmHg (03/30 0752) Pulse Rate: 80 (03/30 0345)  Labs:  Recent Labs  06/07/13 0405 06/08/13 0320 06/09/13 0251  HGB 10.3* 10.3* 10.1*  HCT 31.2* 31.0* 30.2*  PLT 245 267 248  LABPROT 16.5* 20.2* 24.9*  INR 1.37 1.78* 2.34*  HEPARINUNFRC 0.57 0.42 0.70  CREATININE 1.79* 1.92* 1.99*    Estimated Creatinine Clearance: 19.8 ml/min (by C-G formula based on Cr of 1.99).   Assessment: 80 yo F with heart failure on warfarin PTA for afib. Patient admitted with a subtherapeutic INR of 1.26 secondary to accidentally holding her warfarin when instructed to hold Lasix. Pharmacy consulted to dose warfarin with a heparin bridge until INR >2. Patient has a tentative cardioversion planned today conditional on TEE results.   This morning's INR was therapeutic at 2.34 but trended up significantly from yesterday. H/H is low but stable, and Plt are wnl.  No reports of bleeding issues. Will lower today's coumadin dose.   PTA Coumadin regimen: 3 mg daily   Goal of Therapy: INR 2-3  Monitor platelets by anticoagulation protocol: Yes   Plan:  - Stop heparin drip since INR > 2 - give warfarin PO 3 mg x1 dose tonight - daily INR - monitor for s/s of bleeding  Albertina Parr, PharmD.  Clinical Pharmacist Pager [phone]

## 2013-06-09 NOTE — Progress Notes (Addendum)
SUBJECTIVE:  Feels better post cardioverson.  She had some hypotension treated with neosynephrine.   OBJECTIVE:   Vitals:   Filed Vitals:   06/09/13 1055 06/09/13 1101 06/09/13 1120 06/09/13 1134  BP: 107/69 115/79 92/55 105/50  Pulse: 48 66 67   Temp:    97.5 F (36.4 C)  TempSrc:    Oral  Resp: 23 23 24 25   Height:      Weight:      SpO2: 97% 98% 100% 100%   I&O's:   Intake/Output Summary (Last 24 hours) at 06/09/13 1155 Last data filed at 06/09/13 1100  Gross per 24 hour  Intake 963.06 ml  Output      0 ml  Net 963.06 ml   TELEMETRY: Reviewed telemetry pt in NSR, A sensed, V paced:     PHYSICAL EXAM General: Well developed, well nourished, in no acute distress Head:    Normal cephalic and atramatic  Lungs:   Scant wheezing bilaterally Heart:   HRRR S1 S2 ; 3/6 systolic murmur Abdomen: obese, nontender Msk: . Normal strength and tone for age. Extremities:  No edema.   Neuro: Alert and oriented X 3. Psych:  Normal affect, responds appropriately   LABS: Basic Metabolic Panel:  Recent Labs  06/08/13 0320 06/09/13 0251  NA 136* 138  K 4.0 3.8  CL 98 98  CO2 22 24  GLUCOSE 110* 95  BUN 55* 56*  CREATININE 1.92* 1.99*  CALCIUM 8.9 8.9   Liver Function Tests: No results found for this basename: AST, ALT, ALKPHOS, BILITOT, PROT, ALBUMIN,  in the last 72 hours No results found for this basename: LIPASE, AMYLASE,  in the last 72 hours CBC:  Recent Labs  06/08/13 0320 06/09/13 0251  WBC 13.0* 11.7*  HGB 10.3* 10.1*  HCT 31.0* 30.2*  MCV 91.4 91.2  PLT 267 248   Cardiac Enzymes: No results found for this basename: CKTOTAL, CKMB, CKMBINDEX, TROPONINI,  in the last 72 hours BNP: No components found with this basename: POCBNP,  D-Dimer: No results found for this basename: DDIMER,  in the last 72 hours Hemoglobin A1C: No results found for this basename: HGBA1C,  in the last 72 hours Fasting Lipid Panel: No results found for this basename: CHOL, HDL,  LDLCALC, TRIG, CHOLHDL, LDLDIRECT,  in the last 72 hours Thyroid Function Tests: No results found for this basename: TSH, T4TOTAL, FREET3, T3FREE, THYROIDAB,  in the last 72 hours Anemia Panel: No results found for this basename: VITAMINB12, FOLATE, FERRITIN, TIBC, IRON, RETICCTPCT,  in the last 72 hours Coag Panel:   Lab Results  Component Value Date   INR 2.34* 06/09/2013   INR 1.78* 06/08/2013   INR 1.37 06/07/2013    RADIOLOGY: Dg Chest 2 View  06/06/2013   CLINICAL DATA:  Atrial fibrillation. Shortness of breath. Cough and congestion.  EXAM: CHEST  2 VIEW  COMPARISON:  Chest x-ray 05/16/2012.  FINDINGS: Lung volumes are normal. No consolidative airspace disease. No pleural effusions. No evidence of pulmonary edema. Mild pulmonary venous congestion. Mild cardiomegaly. Atherosclerosis in the thoracic aorta. Left-sided biventricular pacemaker/AICD with lead tips projecting over the expected location of the right atrium, overlying the lateral wall of the left ventricle via the coronary sinus and coronary veins, and in the right ventricular apex. Small calcified nodule in the lateral aspect of the right mid lung, similar to numerous prior examinations, most compatible with a calcified granuloma. Multiple old vertebral body compression fractures throughout the mid to lower thoracic spine,  unchanged compared to prior study 06/10/2010.  IMPRESSION: 1. Mild cardiomegaly with mild pulmonary venous congestion, but no frank pulmonary edema. 2. Atherosclerosis.   Electronically Signed   By: Vinnie Langton M.D.   On: 06/06/2013 00:00      ASSESSMENT/PLAN:   AFib/flutter: s/p TEE cardioversion. COntinue warfarin ndefinitely for stroke risk reduction.  Hypotension: resolved.  CKD: Hold Lasix today.  See what Cr is in AM.   Weakness: plan for PT consult.  Systolic dysfunction: Increase Toprol XL to 50 mg daily.  She is taking 25 mg BID here.  Restart diuretic tomorrow if renal function stabilizes.    Possible transfer to tele tomorrow.  Jettie Booze., MD  06/09/2013  11:55 AM

## 2013-06-09 NOTE — Transfer of Care (Signed)
Immediate Anesthesia Transfer of Care Note  Patient: [name]  Procedure(s) Performed: Procedure(s) with comments: TRANSESOPHAGEAL ECHOCARDIOGRAM (TEE) (N/A) CARDIOVERSION (N/A) - 1018 120 j sinus rhythm  Patient Location: PACU and Endoscopy Unit  Anesthesia Type:General  Level of Consciousness: awake, alert  and oriented  Airway & Oxygen Therapy: Patient Spontanous Breathing and Patient connected to nasal cannula oxygen  Post-op Assessment: Report given to PACU RN and Post -op Vital signs reviewed and stable  Post vital signs: Reviewed and stable  Complications: No apparent anesthesia complications

## 2013-06-10 ENCOUNTER — Encounter (HOSPITAL_COMMUNITY): Payer: Self-pay | Admitting: Cardiology

## 2013-06-10 LAB — CBC
HEMATOCRIT: 30.7 % — AB (ref 36.0–46.0)
Hemoglobin: 10.2 g/dL — ABNORMAL LOW (ref 12.0–15.0)
MCH: 30.7 pg (ref 26.0–34.0)
MCHC: 33.2 g/dL (ref 30.0–36.0)
MCV: 92.5 fL (ref 78.0–100.0)
Platelets: 248 10*3/uL (ref 150–400)
RBC: 3.32 MIL/uL — ABNORMAL LOW (ref 3.87–5.11)
RDW: 15.2 % (ref 11.5–15.5)
WBC: 12.5 10*3/uL — AB (ref 4.0–10.5)

## 2013-06-10 LAB — BASIC METABOLIC PANEL
BUN: 62 mg/dL — ABNORMAL HIGH (ref 6–23)
CO2: 23 mEq/L (ref 19–32)
CREATININE: 2.02 mg/dL — AB (ref 0.50–1.10)
Calcium: 8.7 mg/dL (ref 8.4–10.5)
Chloride: 99 mEq/L (ref 96–112)
GFR calc non Af Amer: 21 mL/min — ABNORMAL LOW (ref >90)
GFR, EST AFRICAN AMERICAN: 25 mL/min — AB (ref >90)
GLUCOSE: 102 mg/dL — AB (ref 70–99)
Potassium: 4.3 mEq/L (ref 3.7–5.3)
Sodium: 139 mEq/L (ref 137–147)

## 2013-06-10 LAB — PROTIME-INR
INR: 2.73 — ABNORMAL HIGH (ref 0.00–1.49)
Prothrombin Time: 28 seconds — ABNORMAL HIGH (ref 11.6–15.2)

## 2013-06-10 MED ORDER — WARFARIN SODIUM 1 MG PO TABS
1.0000 mg | ORAL_TABLET | Freq: Once | ORAL | Status: AC
  Administered 2013-06-10: 1 mg via ORAL
  Filled 2013-06-10: qty 1

## 2013-06-10 MED ORDER — FUROSEMIDE 10 MG/ML IJ SOLN
40.0000 mg | Freq: Once | INTRAMUSCULAR | Status: AC
  Administered 2013-06-10: 40 mg via INTRAVENOUS
  Filled 2013-06-10: qty 4

## 2013-06-10 NOTE — Progress Notes (Signed)
SUBJECTIVE:  Feels better post cardioverson.  She had some hypotension treated with neosynephrine yesterday. Sats to 91% with walking. Feels SHOB post coughing. Lasix off for a few days.  OBJECTIVE:   Vitals:   Filed Vitals:   06/10/13 0300 06/10/13 0400 06/10/13 0816 06/10/13 0900  BP: 105/78 105/78 102/59   Pulse: 70 69 76 74  Temp: 97.5 F (36.4 C)  97.8 F (36.6 C)   TempSrc: Oral  Oral   Resp: 22 15 24 21   Height:      Weight:  163 lb 2.3 oz (74 kg)    SpO2: 94% 100% 96% 91%   I&O's:    Intake/Output Summary (Last 24 hours) at 06/10/13 1118 Last data filed at 06/10/13 0816  Gross per 24 hour  Intake    150 ml  Output    200 ml  Net    -50 ml   TELEMETRY: Reviewed telemetry pt in NSR, A sensed, V paced:     PHYSICAL EXAM General: Well developed, well nourished, in no acute distress Head:    Normal cephalic and atramatic  Lungs:   Scant wheezing bilaterally Heart:   HRRR S1 S2 ; 3/6 systolic murmur Abdomen: obese, nontender Msk: . Normal strength and tone for age. Extremities:  No edema.   Neuro: Alert and oriented X 3. Psych:  Normal affect, responds appropriately   LABS: Basic Metabolic Panel:  Recent Labs  06/09/13 0251 06/10/13 0410  NA 138 139  K 3.8 4.3  CL 98 99  CO2 24 23  GLUCOSE 95 102*  BUN 56* 62*  CREATININE 1.99* 2.02*  CALCIUM 8.9 8.7   Liver Function Tests: No results found for this basename: AST, ALT, ALKPHOS, BILITOT, PROT, ALBUMIN,  in the last 72 hours No results found for this basename: LIPASE, AMYLASE,  in the last 72 hours CBC:  Recent Labs  06/09/13 0251 06/10/13 0410  WBC 11.7* 12.5*  HGB 10.1* 10.2*  HCT 30.2* 30.7*  MCV 91.2 92.5  PLT 248 248   Cardiac Enzymes: No results found for this basename: CKTOTAL, CKMB, CKMBINDEX, TROPONINI,  in the last 72 hours BNP: No components found with this basename: POCBNP,  D-Dimer: No results found for this basename: DDIMER,  in the last 72 hours Hemoglobin A1C: No results  found for this basename: HGBA1C,  in the last 72 hours Fasting Lipid Panel: No results found for this basename: CHOL, HDL, LDLCALC, TRIG, CHOLHDL, LDLDIRECT,  in the last 72 hours Thyroid Function Tests: No results found for this basename: TSH, T4TOTAL, FREET3, T3FREE, THYROIDAB,  in the last 72 hours Anemia Panel: No results found for this basename: VITAMINB12, FOLATE, FERRITIN, TIBC, IRON, RETICCTPCT,  in the last 72 hours Coag Panel:   Lab Results  Component Value Date   INR 2.73* 06/10/2013   INR 2.34* 06/09/2013   INR 1.78* 06/08/2013    RADIOLOGY: Dg Chest 2 View  06/06/2013   CLINICAL DATA:  Atrial fibrillation. Shortness of breath. Cough and congestion.  EXAM: CHEST  2 VIEW  COMPARISON:  Chest x-ray 05/16/2012.  FINDINGS: Lung volumes are normal. No consolidative airspace disease. No pleural effusions. No evidence of pulmonary edema. Mild pulmonary venous congestion. Mild cardiomegaly. Atherosclerosis in the thoracic aorta. Left-sided biventricular pacemaker/AICD with lead tips projecting over the expected location of the right atrium, overlying the lateral wall of the left ventricle via the coronary sinus and coronary veins, and in the right ventricular apex. Small calcified nodule in the lateral aspect of  the right mid lung, similar to numerous prior examinations, most compatible with a calcified granuloma. Multiple old vertebral body compression fractures throughout the mid to lower thoracic spine, unchanged compared to prior study 06/10/2010.  IMPRESSION: 1. Mild cardiomegaly with mild pulmonary venous congestion, but no frank pulmonary edema. 2. Atherosclerosis.   Electronically Signed   By: Vinnie Langton M.D.   On: 06/06/2013 00:00      ASSESSMENT/PLAN:   AFib/flutter: s/p TEE cardioversion on 3/30. Continue warfarin indefinitely for stroke risk reduction.  Hypotension: resolved.  CKD: Give one dose of Lasix today given DOE while walking with PT.  See what Cr is in AM.  Possible d/c tomorrow.  Weakness: plan for PT consult.  Systolic dysfunction: Increase Toprol XL to 50 mg daily.  She is taking 25 mg BID here.  Restart diuretic tomorrow if renal function stabilizes.     Jettie Booze., MD  06/10/2013  11:18 AM

## 2013-06-10 NOTE — Progress Notes (Signed)
ANTICOAGULATION CONSULT NOTE - Follow Up  Pharmacy Consult for Coumadin Indication: atrial fibrillation  Allergies  Allergen Reactions  . Iodinated Diagnostic Agents     Unknown    Patient Measurements: Height: 5\' 2"  (157.5 cm) Weight: 163 lb 2.3 oz (74 kg) IBW/kg (Calculated) : 50.1 Heparin Dosing Weight: 65kg  Vital Signs: Temp: 97.8 F (36.6 C) (03/31 0816) Temp src: Oral (03/31 0816) BP: 102/59 mmHg (03/31 0816) Pulse Rate: 74 (03/31 0900)  Labs:  Recent Labs  06/08/13 0320 06/09/13 0251 06/10/13 0410  HGB 10.3* 10.1* 10.2*  HCT 31.0* 30.2* 30.7*  PLT 267 248 248  LABPROT 20.2* 24.9* 28.0*  INR 1.78* 2.34* 2.73*  HEPARINUNFRC 0.42 0.70  --   CREATININE 1.92* 1.99* 2.02*    Estimated Creatinine Clearance: 19.5 ml/min (by C-G formula based on Cr of 2.02).   Assessment: 80 yo F admitted 06/05/2013  with heart failure on warfarin PTA for afib. Patient admitted with a subtherapeutic INR of 1.26 secondary to accidentally holding her warfarin when instructed to hold Lasix. Pharmacy consulted to dose warfarin with a heparin bridge until INR >2. Patient has a tentative cardioversion planned today conditional on TEE results.   This morning's INR was therapeutic at 2.73 but trended up significantly from yesterday. H/H is low but stable, and Plt are wnl.  No reports of bleeding issues. Will lower today's coumadin dose.   PTA Coumadin regimen: 3 mg daily   Goal of Therapy: INR 2-3  Monitor platelets by anticoagulation protocol: Yes   Plan:  - give warfarin PO  1 mg x1 dose tonight, to bring her average daily dose over 6 days to 3.1 mg, closer to her daily dose PTA. - daily INR - monitor for s/s of bleeding   Thank you for allowing pharmacy to be a part of this patients care team.  Rowe Robert Pharm.D., BCPS Clinical Pharmacist 06/10/2013 11:17 AM Pager: [phone] Phone: [phone]

## 2013-06-10 NOTE — Progress Notes (Signed)
Transferred to [facility] room 28 by wheelchair, stable, belongings with daughter, report given to RN.

## 2013-06-10 NOTE — Evaluation (Signed)
Physical Therapy Evaluation Patient Details Name: [name] MRN: [id number] DOB: [birth year] Today's Date: 06/10/2013   History of Present Illness  38F with NICM (LVEF 15-25%, mod MR 11/13), pAF on warfarin, s/p CRT-D with change out to CRT-P, prior CVA (while off warfarin for procedure) who is admitted with CHF in setting of AF recurrence s/p cardioversion 3/30  Clinical Impression  Pt very pleasant but states she has not walked in 4 days since admission and is weak compared to her normal. Pt with below deficits and will benefit from acute therapy to improve strength, balance, mobility and function to decrease burden of care. Pt states she will not have assist at home for mobility and gait and agreeable to ST-SNF stating she previously went to [name] and would be interested in that facility again. Will follow and encouraged daily ambulation to bathroom with nursing and OOB for meals as well as HEP.     Follow Up Recommendations SNF;Supervision for mobility/OOB    Equipment Recommendations  None recommended by PT    Recommendations for Other Services       Precautions / Restrictions Precautions Precautions: Fall Restrictions Weight Bearing Restrictions: No      Mobility  Bed Mobility Overal bed mobility: Modified Independent             General bed mobility comments: increased time  Transfers Overall transfer level: Needs assistance   Transfers: Sit to/from Stand Sit to Stand: Min guard         General transfer comment: cues for hand placement and safety with mobility as well as to back fully to the surface  Ambulation/Gait Ambulation/Gait assistance: Min guard Ambulation Distance (Feet): 12 Feet (x 2 trials with seated rest between each)       Gait velocity interpretation: <1.8 ft/sec, indicative of risk for recurrent falls    Stairs            Wheelchair Mobility    Modified Rankin (Stroke Patients Only)       Balance Overall balance  assessment: Needs assistance   Sitting balance-Leahy Scale: Good       Standing balance-Leahy Scale: Poor                       Pertinent Vitals/Pain No pain HR 73-82 sats 88-95% on RA with desaturation with gait but remains above 92% on RA at rest    [place] expects to be discharged to:: Private residence Living Arrangements: Alone Available Help at Discharge: Personal care attendant Type of Home: House Home Access: Ramped entrance     Home Layout: One level Home Equipment: Environmental consultant - 4 wheels;Wheelchair - [employer] - 2 wheels;Shower seat;Cane - single point;Transport chair      Prior Function Level of Independence: Needs assistance   Gait / Transfers Assistance Needed: mod I with RW for all gait  ADL's / Homemaking Assistance Needed: home instead and housekeeper fix her meals 6 days/wk and dgtr on sunday. Home instead assists with baths  Comments: pt does not drive. Home instead 4 days/wk, housekeeper 2days/wk and pt goes to visit with her dgtr on sunday     Hand Dominance        Extremity/Trunk Assessment   Upper Extremity Assessment: Generalized weakness           Lower Extremity Assessment: Generalized weakness      Cervical / Trunk Assessment: Normal  Communication   Communication: No difficulties  Cognition Arousal/Alertness: Awake/alert Behavior During  Therapy: WFL for tasks assessed/performed Overall Cognitive Status: Within Functional Limits for tasks assessed                      General Comments      Exercises General Exercises - Lower Extremity Long Arc Quad: AROM;Seated;Both;10 reps Hip Flexion/Marching: AROM;Seated;Both;10 reps      Assessment/Plan    PT Assessment Patient needs continued PT services  PT Diagnosis Difficulty walking   PT Problem List Decreased strength;Decreased activity tolerance;Decreased balance;Decreased mobility;Decreased knowledge of use of DME  PT Treatment  Interventions Gait training;DME instruction;Functional mobility training;Therapeutic activities;Therapeutic exercise;Patient/family education   PT Goals (Current goals can be found in the Care Plan section) Acute Rehab PT Goals Patient Stated Goal: be able to be strong enough to return home PT Goal Formulation: With patient Time For Goal Achievement: 06/24/13 Potential to Achieve Goals: Good    Frequency Min 3X/week   Barriers to discharge Decreased caregiver support      End of Session Equipment Utilized During Treatment: Gait belt Activity Tolerance: Patient tolerated treatment well Patient left: in chair;with call bell/phone within reach         Time: 0740-0803 PT Time Calculation (min): 23 min   Charges:   PT Evaluation $Initial PT Evaluation Tier I: 1 Procedure PT Treatments $Therapeutic Activity: 8-22 mins   PT G Codes:          Melford Aase 06/10/2013, 9:47 AM Elwyn Reach, [place]

## 2013-06-11 ENCOUNTER — Encounter: Payer: Medicare Other | Admitting: Cardiology

## 2013-06-11 ENCOUNTER — Inpatient Hospital Stay (HOSPITAL_COMMUNITY): Payer: Medicare Other

## 2013-06-11 LAB — BASIC METABOLIC PANEL
BUN: 69 mg/dL — AB (ref 6–23)
CALCIUM: 8.9 mg/dL (ref 8.4–10.5)
CO2: 16 meq/L — AB (ref 19–32)
CREATININE: 1.97 mg/dL — AB (ref 0.50–1.10)
Chloride: 99 mEq/L (ref 96–112)
GFR calc Af Amer: 26 mL/min — ABNORMAL LOW (ref >90)
GFR calc non Af Amer: 22 mL/min — ABNORMAL LOW (ref >90)
GLUCOSE: 124 mg/dL — AB (ref 70–99)
Potassium: 6.1 mEq/L — ABNORMAL HIGH (ref 3.7–5.3)
Sodium: 133 mEq/L — ABNORMAL LOW (ref 137–147)

## 2013-06-11 LAB — CBC
HEMATOCRIT: 30.5 % — AB (ref 36.0–46.0)
Hemoglobin: 10 g/dL — ABNORMAL LOW (ref 12.0–15.0)
MCH: 30.3 pg (ref 26.0–34.0)
MCHC: 32.8 g/dL (ref 30.0–36.0)
MCV: 92.4 fL (ref 78.0–100.0)
PLATELETS: 239 10*3/uL (ref 150–400)
RBC: 3.3 MIL/uL — AB (ref 3.87–5.11)
RDW: 15.2 % (ref 11.5–15.5)
WBC: 11.4 10*3/uL — ABNORMAL HIGH (ref 4.0–10.5)

## 2013-06-11 LAB — PROTIME-INR
INR: 2.62 — ABNORMAL HIGH (ref 0.00–1.49)
Prothrombin Time: 27.1 seconds — ABNORMAL HIGH (ref 11.6–15.2)

## 2013-06-11 MED ORDER — WARFARIN SODIUM 3 MG PO TABS
3.0000 mg | ORAL_TABLET | Freq: Once | ORAL | Status: AC
Start: 2013-06-11 — End: 2013-06-11
  Administered 2013-06-11: 3 mg via ORAL
  Filled 2013-06-11: qty 1

## 2013-06-11 MED ORDER — FUROSEMIDE 10 MG/ML IJ SOLN
80.0000 mg | Freq: Once | INTRAMUSCULAR | Status: AC
  Administered 2013-06-11: 80 mg via INTRAVENOUS
  Filled 2013-06-11: qty 8

## 2013-06-11 MED ORDER — DEXTROMETHORPHAN POLISTIREX 30 MG/5ML PO LQCR
15.0000 mg | Freq: Two times a day (BID) | ORAL | Status: DC
  Administered 2013-06-11 – 2013-06-16 (×11): 15 mg via ORAL
  Filled 2013-06-11 (×13): qty 5

## 2013-06-11 MED ORDER — FUROSEMIDE 10 MG/ML IJ SOLN
80.0000 mg | Freq: Once | INTRAMUSCULAR | Status: AC
  Administered 2013-06-12: 80 mg via INTRAVENOUS
  Filled 2013-06-11: qty 8

## 2013-06-11 NOTE — Evaluation (Signed)
Occupational Therapy Evaluation Patient Details Name: [name] MRN: [id number] DOB: 1926/07/27 Today's Date: 06/11/2013    History of Present Illness 56F with NICM (LVEF 15-25%, mod MR 11/13), pAF on warfarin, s/p CRT-D with change out to CRT-P, prior CVA (while off warfarin for procedure) who is admitted with CHF in setting of AF recurrence s/p cardioversion 3/30   Clinical Impression   Pt demonstrates decline in function with ADls and ADL mobility safety with decreased strength, balance and endurance. Pt would benefit from acute OT services to address impairments to increase level of function and safety. Pt states that she may need ST SNF for rehab before returning home    Follow Up Recommendations  SNF;Supervision/Assistance - 24 hour    Equipment Recommendations   (ADL A/E)    Recommendations for Other Services       Precautions / Restrictions Precautions Precautions: Fall Restrictions Weight Bearing Restrictions: No      Mobility Bed Mobility Overal bed mobility: Needs Assistance Bed Mobility: Supine to Sit     Supine to sit: Min assist;HOB elevated     General bed mobility comments: increased time, min A to bring trunk upright   Transfers Overall transfer level: Needs assistance Equipment used: Rolling walker (2 wheeled) Transfers: Sit to/from Stand Sit to Stand: Min assist         General transfer comment: cues for hand placement and safety    Balance Overall balance assessment: Needs assistance Sitting-balance support: No upper extremity supported;Feet supported Sitting balance-Leahy Scale: Good     Standing balance support: Bilateral upper extremity supported;Single extremity supported;During functional activity Standing balance-Leahy Scale: Poor                      ADL   Grooming: Wash/dry hands;Wash/dry face;Standing;Cueing for safety   Upper Body Dressing : Supervision/safety;Set up;Sitting Lower Body Bathing: Moderate  assistance;Sitting/lateral leans;Sit to/from stand;Cueing for safety Lower Body Dressing: Maximal assistance;Sitting/lateral leans;Sit to/from stand;Cueing for safety Toilet Transfer: Minimal assistance;BSC;RW;Ambulation Toileting- Clothing Manipulation and Hygiene: Moderate assistance;Sit to/from stand     General ADL Comments: Pt fatigues easily, requires increased time     Vision  wears glasses at all times                              Pertinent Vitals/Pain 4/10 reports of soreness in chest when coughing and with movement. O2 SATs 93% on room air sitting EOB, decreasing to 88-91% after transition to [facility] with RW     Hand Dominance Right   Extremity/Trunk Assessment Upper Extremity Assessment Upper Extremity Assessment: Generalized weakness   Lower Extremity Assessment Lower Extremity Assessment: Defer to PT evaluation   Cervical / Trunk Assessment Cervical / Trunk Assessment: Normal   Communication Communication Communication: No difficulties   Cognition Arousal/Alertness: Awake/alert Behavior During Therapy: WFL for tasks assessed/performed Overall Cognitive Status: Within Functional Limits for tasks assessed                     General Comments   Pt pleasant and cooperative          Home Living Family/patient expects to be discharged to:: Private residence Living Arrangements: Alone Available Help at Discharge: Personal care attendant Type of Home: House Home Access: Ramped entrance     Home Layout: One level     Bathroom Shower/Tub: Tub/shower unit;Walk-in shower   Bathroom Toilet: Standard     Home Equipment: Environmental consultant -  4 wheels;Wheelchair - [employer] - 2 wheels;Shower seat;Cane - single point;Transport chair;Toilet riser;Grab bars - toilet;Grab bars - tub/shower          Prior Functioning/Environment Level of Independence: Needs assistance  Gait / Transfers Assistance Needed: mod I with RW for all gait ADL's / Homemaking  Assistance Needed: home instead and housekeeper fix her meals 6 days/wk and dgtr on sunday. Home instead assists with baths   Comments: pt does not drive. Home instead 4 days/wk, housekeeper 2days/wk and pt goes to visit with her dgtr on sunday    OT Diagnosis:  Generalized weakness   OT Problem List: Decreased strength;Decreased activity tolerance;Impaired balance (sitting and/or standing)   OT Treatment/Interventions: Self-care/ADL training;Therapeutic exercise;Patient/family education;Neuromuscular education;Balance training;Therapeutic activities;Energy conservation;DME and/or AE instruction    OT Goals(Current goals can be found in the care plan section) ADL Goals Pt Will Perform Grooming: with set-up;with supervision;standing Pt Will Perform Upper Body Bathing: with set-up;standing;sitting Pt Will Perform Lower Body Bathing: with min assist;sitting/lateral leans;sit to/from stand Pt Will Perform Upper Body Dressing: standing;sitting;with set-up Pt Will Transfer to Toilet: with min guard assist;with supervision;ambulating;regular height toilet;bedside commode;grab bars Pt Will Perform Toileting - Clothing Manipulation and hygiene: with min assist;with min guard assist;sit to/from stand;sitting/lateral leans Additional ADL Goal #1: Pt will verbalize and demonstrate 3 energy conservation techniques for/during ADLs and ADL mobility  OT Frequency: Min 2X/week   Barriers to D/C: Decreased caregiver support, pt lives at home alone but does have hired help 6 days/wk for housekeeping/cooking and bathing assist per pt report          End of Session: Equipment Utilized During Treatment: Gait belt;Rolling walker;Other (comment) (BSC)  Activity Tolerance: Patient limited by fatigue Patient left: in bed;with call bell/phone within reach;with bed alarm set   Time: 1941-7408 OT Time Calculation (min): 21 min Charges:  OT General Charges $OT Visit: 1 Procedure OT Evaluation $Initial OT  Evaluation Tier I: 1 Procedure OT Treatments $Therapeutic Activity: 8-22 mins G-Codes:    Britt Bottom 06/11/2013, 2:31 PM

## 2013-06-11 NOTE — Progress Notes (Signed)
Patient Name: [name]      SUBJECTIVE   Hx of NICM with prior CRT-D changed for CRT-P 2012 and recently noted to be in atrial fibrillation with worsening CHF and plan was to undertake DCCV- but signals got crossed and her anticoagulation was stopped  She was admitted with worsening symptoms and subtherapeutic INR and underwent Hep/coumadin supported TEE DCCV  TEE >> severe LV dysfunction and LAE with severe MR/TR Diuretics had been held 2/2 renal dys  (BUN/Cr  60/1.9<<<24/1.6 [3/14]) also had hyperkalemia on aldactone and inAFib low blood pressure  She is struggled since being hospitalized. There have been issues of dyspnea. This is been somewhat positional. She has been significantly better since cardioversion but still quite limited.  Her daughter raises concerns about end-of-life care. Her brother is coming to town today.  She was a little confused on initial discussion. She denies chest pain. There is some shortness of breath weakness and cough  Past Medical History  Diagnosis Date  . Benign neoplasm of colon   . Diverticulosis of colon (without mention of hemorrhage)   . Reflux esophagitis   . Gastric polyp     history of  . GERD (gastroesophageal reflux disease)   . Stricture and stenosis of esophagus   . Reactive airway disease   . Anemia, iron deficiency   . CHF (congestive heart failure)   . Restless leg syndrome   . Nonischemic cardiomyopathy     EF 20-25% 01-23-12 / BiV AICD  . Atrial fibrillation   . Diabetes mellitus     controlled with diet  . Osteoarthritis   . Hypertension   . UTI (lower urinary tract infection)   . Pacemaker CRT  BSx   . Chronic kidney disease (CKD), stage III (moderate)   . Hyperlipemia   . Anticoagulated on Coumadin   . Shortness of breath   . Asthma   . Alopecia  2/2 betablockers     Scheduled Meds:  Scheduled Meds: . budesonide-formoterol  2 puff Inhalation Daily  . citalopram  10 mg Oral Daily  . diclofenac  sodium  4 g Topical QID  . irbesartan  37.5 mg Oral Daily  . ketotifen  2 drop Both Eyes BID  . loratadine  10 mg Oral Daily  . metoprolol succinate  50 mg Oral Daily  . omega-3 acid ethyl esters  1 g Oral Daily  . pantoprazole  40 mg Oral Daily  . rOPINIRole  1 mg Oral Daily  . sodium chloride  3 mL Intravenous Q12H  . sodium chloride  3 mL Intravenous Q12H  . tiotropium  18 mcg Inhalation Daily  . Vitamin D (Ergocalciferol)  50,000 Units Oral Q Tue  . Warfarin - Pharmacist Dosing Inpatient   Does not apply q1800   Continuous Infusions: . sodium chloride      PHYSICAL EXAM Filed Vitals:   06/10/13 1700 06/10/13 1855 06/10/13 2045 06/11/13 0354  BP: 99/59 110/76 97/60 116/80  Pulse: 70 70 71 75  Temp:  97.6 F (36.4 C) 97.6 F (36.4 C) 97.8 F (36.6 C)  TempSrc:  Oral Oral Oral  Resp: 29 21 20 20   Height:      Weight:    176 lb 8 oz (80.06 kg)  SpO2: 100% 100% 100% 92%    General appearance: alert, cooperative and mild distress Neck: JVD - 8 cm above sternal notch Lungs: Tubular breath sounds right base and diffuse rhonchi Heart: regular rate  and rhythm Abdomen: soft, non-tender; bowel sounds normal; no masses,  no organomegaly Extremities: edema None Skin: Skin color, texture, turgor normal. No rashes or lesions Neurologic: Alert and oriented X 3, normal strength and tone. Normal symmetric reflexes. Normal coordination and gait  TELEMETRY: Reviewed telemetry pt in nsr:    Intake/Output Summary (Last 24 hours) at 06/11/13 0906 Last data filed at 06/10/13 2354  Gross per 24 hour  Intake      3 ml  Output    200 ml  Net   -197 ml    LABS: Basic Metabolic Panel:  Recent Labs Lab 06/05/13 2220 06/07/13 0405 06/08/13 0320 06/09/13 0251 06/10/13 0410  NA 140 137 136* 138 139  K 4.6 4.7 4.0 3.8 4.3  CL 102 99 98 98 99  CO2 21 22 22 24 23   GLUCOSE 143* 104* 110* 95 102*  BUN 42* 51* 55* 56* 62*  CREATININE 1.66* 1.79* 1.92* 1.99* 2.02*  CALCIUM 9.5 8.9  8.9 8.9 8.7   Cardiac Enzymes: No results found for this basename: CKTOTAL, CKMB, CKMBINDEX, TROPONINI,  in the last 72 hours CBC:  Recent Labs Lab 06/05/13 2220 06/07/13 0405 06/08/13 0320 06/09/13 0251 06/10/13 0410 06/11/13 0500  WBC 13.5* 11.7* 13.0* 11.7* 12.5* 11.4*  HGB 10.4* 10.3* 10.3* 10.1* 10.2* 10.0*  HCT 31.6* 31.2* 31.0* 30.2* 30.7* 30.5*  MCV 91.3 93.1 91.4 91.2 92.5 92.4  PLT 272 245 267 248 248 239   PROTIME:  Recent Labs  06/09/13 0251 06/10/13 0410 06/11/13 0500  LABPROT 24.9* 28.0* 27.1*  INR 2.34* 2.73* 2.62*   Liver Function Tests: No results found for this basename: AST, ALT, ALKPHOS, BILITOT, PROT, ALBUMIN,  in the last 72 hours No results found for this basename: LIPASE, AMYLASE,  in the last 72 hours BNP: BNP (last 3 results)  Recent Labs  06/05/13 2220  PROBNP 13216.0*   D-Dimer:  ASSESSMENT AND PLAN:  Principal Problem:   Acute on chronic systolic heart failure Active Problems:   Atrial fibrillation   Acute on chronic renal failure   Cardiomyopathy, ischemic   Weakness generalized  Cough   I had a lengthy discussion today with Mrs. Amer and her daughter. They had significant concerns about end-of-life issues that are quite appropriate. There is evidence of worsening renal insufficiency even at the time of ongoing dyspnea likely related to systolic heart failure  Issues related to her atrial fibrillation include the use of amiodarone and his previous association with LFT abnormalities as well as nausea and anorexia. Unfortunately, we do not have initiation blood work. It was started in 12/12. She took it for about 15 months. In discussions with the patient and her daughter today we have decided to resume it as a quality of life issue deferring the concern about liver testing and anorexia to the future.  Also discussed palliative care consult with the goal being very short order pain the patient home with hospice support as  necessary.  We'll recheck a metabolic profile today. We'll get OT and PT to see her also today.       Signed, Virl Axe MD  06/11/2013

## 2013-06-11 NOTE — Telephone Encounter (Signed)
Dr. Caryl Comes met with family, in office, today.

## 2013-06-11 NOTE — Progress Notes (Signed)
Spoke with family for 45 minutes this afternoon to review her situation and craft a plan  1)  Iv diuretics aware of reisk to kidneys 2) Palliative care/Hospice consultation 3) MS as needed

## 2013-06-11 NOTE — Progress Notes (Signed)
Thank you for consulting the Palliative Medicine Team at [facility] to meet your patient's and family's needs.   The reason that you asked [place] to see your patient is for [name] and options.   We have scheduled your patient for a meeting: 4/2 1100 am  The Surrogate decision make is: [name] Contact information: [phone], [phone]   Other family members that need to be present: 2 daughters and 1 son    Your patient is able/unable to participate: able  Vinie Sill, NP Palliative Medicine Team Pager # [phone] (M-F 8a-5p) Team Phone # [phone] (Nights/Weekends)

## 2013-06-11 NOTE — Care Management Note (Addendum)
Page 1 of 2   06/13/2013     1:57:31 PM   CARE MANAGEMENT NOTE 06/13/2013  Patient:  Kristen, Hoffman   Account Number:  [id number]  Date Initiated:  06/09/2013  Documentation initiated by:  [name]  Subjective/Objective Assessment:   adm w chf     Action/Plan:   lives alone, pcp dr [name], has aide per chart   Anticipated DC Date:  06/16/2013   Anticipated DC Plan:  [place]  CM consult      PAC Choice  HOSPICE   Choice offered to / List presented to:  C-4 Adult Children   DME arranged  3-N-1  [place]      DME agency  [name] arranged  HH-1 RN  HH-10 DISEASE MANAGEMENT      [name] agency  [facility] [place]   Status of service:   Medicare Important Message given?  YES (If response is "NO", the following Medicare IM given date fields will be blank) Date Medicare IM given:  06/13/2013 Date Additional Medicare IM given:    Discharge Disposition:  [name]  Per UR Regulation:  Reviewed for med. necessity/level of care/duration of stay  If discussed at [place] of Stay Meetings, dates discussed:   06/10/2013  06/12/2013    Comments:  06-13-13 1328 [name]- Kristen Cushing, RN, BSN [phone] CM was able to speak with daughter [name] at 12 am. Daughter has chosen [facility] for agency choice. CM was ready to d/c pt today with Hospice Services, however pt and family were apprehensive due to they stated MD [name] stated they would be here over the weekend. On call liaison  for [facility] [name] was notified of d/c today. [name] would be able to have a RN visit on Sat at 1000 am. Family voiced concerns about not wanting to d/c today and d/c was cancelled. CM did have to call physician advisor to make him aware of case and pt no longer meets for inpatient stay at this time. CM did provide pt with an IM.  CM did later fax [name] [phone] @  [facility] the Nursing Order face sheet and notes up until 06-13-13. The case has been opened and the [place] liaison will be by on Monday and continue the referral process. CM did order DME to be delivered to pt's home via [facility]. CM provided pt with Personal Care list for pt to have 24 hr coverage if needed. Pt's daughter [name] will be in town staying with pt for a while. CM did make pt and family aware that a Hospice Nurse will not be in home around the clock to administer medications. Daughter was under the impression that Hospice RN would be in home to provide medications. CM did provide clarity. Plan will be for d/c on Monday for home with Hospice. CM will continue to monitor for additional needs.   06-12-13 [place], [place] [phone] CM did speak to daughter [name] and provided her with Hospice Agency List and [place] list. [name] stated that family has not chosen an agency for Hospice as of yet and will Speak to sister [name]. [name] to return to hospital on Tuesday. Pt will need Hospital bed, over bed table and oxygen as of now. CM will continue to monitor fro disposition needs.   06-11-13 [place]  Graves[place]el[place], RN,BSN [phone] Pt tx for 2H. S/p Cardioversion 06-09-13. Pt was given one dose IV lasix on 06-10-13 pt with worsening Renal insufficiency. Plan for Palliative Consult. CM will continue to monitor for disposition needs.

## 2013-06-11 NOTE — Progress Notes (Signed)
OT Cancellation Note  Patient Details Name: [name] MRN: [id number] DOB: 1926/05/15   Cancelled Treatment:    Reason Eval/Treat Not Completed: Fatigue/lethargy limiting ability to participate. Pt/family requested OT return later. Will re attempt later today as time allows  Britt Bottom 06/11/2013, 11:45 AM

## 2013-06-11 NOTE — Progress Notes (Signed)
ANTICOAGULATION CONSULT NOTE - Follow Up Consult  Pharmacy Consult for Coumadin Indication: atrial fibrillation  Allergies  Allergen Reactions  . Iodinated Diagnostic Agents     Unknown    Patient Measurements: Height: 5\' 2"  (157.5 cm) Weight: 176 lb 8 oz (80.06 kg) IBW/kg (Calculated) : 50.1  Vital Signs: Temp: 97.8 F (36.6 C) (04/01 0354) Temp src: Oral (04/01 0354) BP: 116/80 mmHg (04/01 0354) Pulse Rate: 75 (04/01 0354)  Labs:  Recent Labs  06/09/13 0251 06/10/13 0410 06/11/13 0500  HGB 10.1* 10.2* 10.0*  HCT 30.2* 30.7* 30.5*  PLT 248 248 239  LABPROT 24.9* 28.0* 27.1*  INR 2.34* 2.73* 2.62*  HEPARINUNFRC 0.70  --   --   CREATININE 1.99* 2.02*  --     Estimated Creatinine Clearance: 20.3 ml/min (by C-G formula based on Cr of 2.02).  Assessment: 86yof continues on coumadin for afib. INR is therapeutic. Hgb low but stable, platelets ok. No bleeding reported.  PTA Coumadin regimen: 3 mg daily   Goal of Therapy:  Heparin level 0.3-0.7 units/ml Monitor platelets by anticoagulation protocol: Yes   Plan:  1) Coumadin 3mg  x 1 2) INR in AM  Deboraha Sprang 06/11/2013,9:57 AM

## 2013-06-12 DIAGNOSIS — Z515 Encounter for palliative care: Secondary | ICD-10-CM

## 2013-06-12 LAB — BASIC METABOLIC PANEL
BUN: 68 mg/dL — AB (ref 6–23)
CALCIUM: 8.9 mg/dL (ref 8.4–10.5)
CO2: 22 mEq/L (ref 19–32)
CREATININE: 1.81 mg/dL — AB (ref 0.50–1.10)
Chloride: 101 mEq/L (ref 96–112)
GFR calc Af Amer: 28 mL/min — ABNORMAL LOW (ref >90)
GFR, EST NON AFRICAN AMERICAN: 25 mL/min — AB (ref >90)
Glucose, Bld: 93 mg/dL (ref 70–99)
Potassium: 4.3 mEq/L (ref 3.7–5.3)
Sodium: 139 mEq/L (ref 137–147)

## 2013-06-12 LAB — CBC
HEMATOCRIT: 31.6 % — AB (ref 36.0–46.0)
Hemoglobin: 10.3 g/dL — ABNORMAL LOW (ref 12.0–15.0)
MCH: 30.3 pg (ref 26.0–34.0)
MCHC: 32.6 g/dL (ref 30.0–36.0)
MCV: 92.9 fL (ref 78.0–100.0)
Platelets: 238 10*3/uL (ref 150–400)
RBC: 3.4 MIL/uL — AB (ref 3.87–5.11)
RDW: 15.1 % (ref 11.5–15.5)
WBC: 11 10*3/uL — ABNORMAL HIGH (ref 4.0–10.5)

## 2013-06-12 LAB — PROTIME-INR
INR: 2.39 — ABNORMAL HIGH (ref 0.00–1.49)
PROTHROMBIN TIME: 25.3 s — AB (ref 11.6–15.2)

## 2013-06-12 MED ORDER — SORBITOL 70 % SOLN
30.0000 mL | Freq: Every day | Status: DC | PRN
  Administered 2013-06-15: 30 mL via ORAL
  Filled 2013-06-12: qty 30

## 2013-06-12 MED ORDER — MORPHINE SULFATE (CONCENTRATE) 10 MG /0.5 ML PO SOLN: 5.0000 mg | ORAL | Status: DC

## 2013-06-12 MED ORDER — AMIODARONE HCL 200 MG PO TABS
400.0000 mg | ORAL_TABLET | Freq: Two times a day (BID) | ORAL | Status: DC
  Administered 2013-06-12 – 2013-06-16 (×9): 400 mg via ORAL
  Filled 2013-06-12 (×11): qty 2

## 2013-06-12 MED ORDER — LIDOCAINE 5 % EX PTCH
2.0000 | MEDICATED_PATCH | CUTANEOUS | Status: DC
  Administered 2013-06-15: 2 via TRANSDERMAL
  Filled 2013-06-12 (×5): qty 2

## 2013-06-12 MED ORDER — MORPHINE SULFATE (CONCENTRATE) 10 MG /0.5 ML PO SOLN
10.0000 mg | ORAL | Status: DC | PRN
Start: 2013-06-12 — End: 2013-06-12

## 2013-06-12 MED ORDER — FUROSEMIDE 10 MG/ML IJ SOLN
80.0000 mg | Freq: Two times a day (BID) | INTRAMUSCULAR | Status: AC
  Administered 2013-06-12: 80 mg via INTRAVENOUS
  Filled 2013-06-12: qty 8

## 2013-06-12 MED ORDER — MORPHINE SULFATE (CONCENTRATE) 10 MG /0.5 ML PO SOLN
5.0000 mg | ORAL | Status: DC | PRN
  Administered 2013-06-12 – 2013-06-16 (×5): 5 mg via ORAL
  Filled 2013-06-12 (×5): qty 0.5

## 2013-06-12 MED ORDER — WARFARIN SODIUM 3 MG PO TABS
3.0000 mg | ORAL_TABLET | Freq: Once | ORAL | Status: AC
  Administered 2013-06-12: 3 mg via ORAL
  Filled 2013-06-12: qty 1

## 2013-06-12 MED ORDER — DIAZEPAM 2 MG PO TABS
2.0000 mg | ORAL_TABLET | Freq: Two times a day (BID) | ORAL | Status: DC | PRN
  Administered 2013-06-12 – 2013-06-15 (×2): 2 mg via ORAL
  Filled 2013-06-12 (×2): qty 1

## 2013-06-12 MED ORDER — MORPHINE SULFATE (CONCENTRATE) 10 MG /0.5 ML PO SOLN
5.0000 mg | ORAL | Status: AC
  Administered 2013-06-12: 5 mg via ORAL
  Filled 2013-06-12: qty 0.5

## 2013-06-12 NOTE — Telephone Encounter (Signed)
Late entry -  4/1  Pt's family and Dr. Caryl Comes had meeting in office to discuss plan of care

## 2013-06-12 NOTE — Progress Notes (Signed)
Patient evaluated for community based chronic disease management services with [facility] as a benefit of patient's [employer]. Spoke with patient and family at bedside to explain [facility] services.  Family has elected to go home with hospice services.  [facility] will not engage at this time.  Left contact information and [facility] literature at bedside. Made Inpatient Case Manager aware that [facility] following. Of note, [facility] Management services does not replace or interfere with any services that are arranged by inpatient case management or social work.  For additional questions or referrals please contact Corliss Blacker BSN RN [facility] Liaison at [phone].

## 2013-06-12 NOTE — Progress Notes (Signed)
error 

## 2013-06-12 NOTE — Consult Note (Signed)
Patient [name]      DOB: July 07, 1926      WGY:[id number]     Consult Note from the Palliative Medicine Team at [place] Requested by: Ileene Hutchinson, PA   PCP: Donnajean Lopes, MD Reason for Consultation:GOC and options.    Phone Number:[phone]  Assessment of patients Current state: [name] is a 80 yo female admitted 06/05/13 with acute on chronic CHF exacerbated by atrial fibrillation recurrence. PMH significant for NICM, atrial fibrillation on warfarin, diverticulosis, esophageal stricture, DM, pacemaker, CKD stage III, and asthma. She had echocardiogram and cardioversion 06/09/13 and EF was found to be 15% with severe LV dysfunction and moderately reduced systolic function (also severe mitral/tricuspid regurgitation) and she required transient neosynephrine d/t hypotension and hypoxia during procedure. Repeat echo 06/06/13 finds EF 20% with mildly reduced systolic function, mild tricuspid regurgitation, and moderate mitral regurgitation.   I met today with [name] and her children: [name] [facility] [phone]), [name] [phone]), and [name] [phone]). They tell me that they have met with Dr. Caryl Comes yesterday at his office and they are very pleased with his explanation and tell me they understand and are realistic. They also tell me they do not want [name] to suffer and wish to focus on comfort. They explain the past ~3 weeks have been difficult with worsening shortness of breath, weakness, and decreased appetite. When I entered the room [name] was having a coughing episode with dyspnea that lasted for a total of ~1 hour. They wish for her to not go through these episodes as she become severely short of breath and would like comfort at all costs. We discussed comfort care and hospice care. They wish for her to go home with comfort care (once symptoms are better controlled) and the support of hospice. They will provide personal caregivers to supplement the time  when hospice is not there. DNR and MOST complete: DNR, comfort measures (no return to hospital), consider antibiotics, no IV fluids, and no feeding tube. We also discussed the use of hospice facility as needed to obtain comfort measures. I will continue to follow and support this patient and family.    Goals of Care: 1.  Code Status: DNR   2. Scope of Treatment: Continue medical management but focus on comfort is a priority.    4. Disposition: Home with hospice.    3. Symptom Management:   1. Anxiety/Agitation: Valium 2 mg prn for anxiety, muscle spasms, or sleep. May also help with her pain around ribs r/t coughing episodes.  2. Pain: Roxanol 5 mg prn for pain or dyspnea. Lidoderm patch to neck and lower back.  3. Bowel Regimen: Sorbitol prn.  4. Fever: Acetaminophen prn.   4. Psychosocial: Emotional support provided to patient and family during difficult conversation and decisions.  5. Spiritual:   Patient Documents Completed or Given: Document Given Completed  Advanced Directives Pkt    MOST  yes  DNR  yes  Gone from My Sight    Hard Choices      Brief HPI: 80 yo female admitted 06/05/13 with acute on chronic CHF exacerbated by atrial fibrillation recurrence. PMH significant for NICM, atrial fibrillation on warfarin, diverticulosis, esophageal stricture, DM, pacemaker, CKD stage III, and asthma. She had echocardiogram and cardioversion 06/09/13 and EF was found to be 15% with severe LV dysfunction and moderately reduced systolic function (also severe mitral/tricuspid regurgitation) and she required transient neosynephrine d/t hypotension and hypoxia during procedure. Repeat echo 06/06/13  finds EF 20% with mildly reduced systolic function, mild tricuspid regurgitation, and moderate mitral regurgitation.    ROS: + cough, + shortness of breath, + neck/lower back pain, denies nausea/constipation    PMH:  Past Medical History  Diagnosis Date  . Benign neoplasm of colon   .  Diverticulosis of colon (without mention of hemorrhage)   . Reflux esophagitis   . Gastric polyp     history of  . GERD (gastroesophageal reflux disease)   . Stricture and stenosis of esophagus   . Reactive airway disease   . Anemia, iron deficiency   . CHF (congestive heart failure)   . Restless leg syndrome   . Nonischemic cardiomyopathy     EF 20-25% 01-23-12 / BiV AICD  . Atrial fibrillation   . Diabetes mellitus     controlled with diet  . Osteoarthritis   . Hypertension   . UTI (lower urinary tract infection)   . Pacemaker CRT  BSx   . Chronic kidney disease (CKD), stage III (moderate)   . Hyperlipemia   . Anticoagulated on Coumadin   . Shortness of breath   . Asthma   . Alopecia  2/2 betablockers      PSH: Past Surgical History  Procedure Laterality Date  . Cardiac defibrillator placement  1999, 2001, 3.30.2012    [facility], Removal of previously implanted BiV ICD followed by insertion of BiV pacemaker with pacemaker pocket revision. without immediate procedural complications.   . Eye surgery  2002    cataract   . Total hip arthroplasty  2004    right  . Insert / replace / remove pacemaker    . Joint replacement    . Esophagogastroduodenoscopy (egd) with esophageal dilation  01/31/2012    Procedure: ESOPHAGOGASTRODUODENOSCOPY (EGD) WITH ESOPHAGEAL DILATION;  Surgeon: Milus Banister, MD;  Location: [place];  Service: Endoscopy;  Laterality: N/A;  . Esophagogastroduodenoscopy  02/01/2012    Procedure: ESOPHAGOGASTRODUODENOSCOPY (EGD);  Surgeon: Milus Banister, MD;  Location: [place];  Service: Endoscopy;  Laterality: N/A;  . Tee without cardioversion N/A 06/09/2013    Procedure: TRANSESOPHAGEAL ECHOCARDIOGRAM (TEE);  Surgeon: Lelon Perla, MD;  Location[facility] ENDOSCOPY;  Service: Cardiovascular;  Laterality: N/A;  . Cardioversion N/A 06/09/2013    Procedure: CARDIOVERSION;  Surgeon: Lelon Perla, MD;  Location: [facility] ENDOSCOPY;  Service:  Cardiovascular;  Laterality: N/A;  1018 120 j sinus rhythm   I have reviewed the [place] and SH and  If appropriate update it with new information. Allergies  Allergen Reactions  . Iodinated Diagnostic Agents     Unknown   Scheduled Meds: . amiodarone  400 mg Oral BID  . budesonide-formoterol  2 puff Inhalation Daily  . citalopram  10 mg Oral Daily  . dextromethorphan  15 mg Oral BID  . diclofenac sodium  4 g Topical QID  . furosemide  80 mg Intravenous BID  . irbesartan  37.5 mg Oral Daily  . ketotifen  2 drop Both Eyes BID  . loratadine  10 mg Oral Daily  . metoprolol succinate  50 mg Oral Daily  . omega-3 acid ethyl esters  1 g Oral Daily  . pantoprazole  40 mg Oral Daily  . rOPINIRole  1 mg Oral Daily  . sodium chloride  3 mL Intravenous Q12H  . sodium chloride  3 mL Intravenous Q12H  . tiotropium  18 mcg Inhalation Daily  . Vitamin D (Ergocalciferol)  50,000 Units Oral Q Tue  . Warfarin -  Pharmacist Dosing Inpatient   Does not apply q1800   Continuous Infusions: . sodium chloride     PRN Meds:.sodium chloride, acetaminophen, benzonatate, sodium chloride, sodium chloride    BP 116/70  Pulse 76  Temp(Src) 98.6 F (37 C) (Oral)  Resp 19  Ht 5' 2"  (1.575 m)  Wt 80.876 kg (178 lb 4.8 oz)  BMI 32.60 kg/m2  SpO2 94%  LMP 01/10/2012   PPS: 30%   Intake/Output Summary (Last 24 hours) at 06/12/13 1043 Last data filed at 06/12/13 0728  Gross per 24 hour  Intake    120 ml  Output    950 ml  Net   -830 ml   LBM: 06/09/13                         Physical Exam:  General: + Distress, awake, alert HEENT: [place]/AT, + JVD, moist mucous membranes Chest: Decreased in bases with scattered rhonchi CVS: RRR, S1 S2 Abdomen: Soft, NT, ND, +BS Ext: MAE, BLE 1+ edema, warm to touch Neuro: Alert, oriented x 3, follows commands  Labs: CBC    Component Value Date/Time   WBC 11.0* 06/12/2013 0655   RBC 3.40* 06/12/2013 0655   HGB 10.3* 06/12/2013 0655   HCT 31.6* 06/12/2013 0655   PLT  238 06/12/2013 0655   MCV 92.9 06/12/2013 0655   MCH 30.3 06/12/2013 0655   MCHC 32.6 06/12/2013 0655   RDW 15.1 06/12/2013 0655   LYMPHSABS 1.2 05/26/2013 1056   MONOABS 0.7 05/26/2013 1056   EOSABS 0.1 05/26/2013 1056   BASOSABS 0.0 05/26/2013 1056    BMET    Component Value Date/Time   NA 139 06/12/2013 0655   K 4.3 06/12/2013 0655   CL 101 06/12/2013 0655   CO2 22 06/12/2013 0655   GLUCOSE 93 06/12/2013 0655   BUN 68* 06/12/2013 0655   CREATININE 1.81* 06/12/2013 0655   CALCIUM 8.9 06/12/2013 0655   GFRNONAA 25* 06/12/2013 0655   GFRAA 28* 06/12/2013 0655    CMP     Component Value Date/Time   NA 139 06/12/2013 0655   K 4.3 06/12/2013 0655   CL 101 06/12/2013 0655   CO2 22 06/12/2013 0655   GLUCOSE 93 06/12/2013 0655   BUN 68* 06/12/2013 0655   CREATININE 1.81* 06/12/2013 0655   CALCIUM 8.9 06/12/2013 0655   PROT 6.6 06/24/2012 1501   ALBUMIN 3.3* 06/24/2012 1501   AST 54* 06/24/2012 1501   ALT 44* 06/24/2012 1501   ALKPHOS 138* 06/24/2012 1501   BILITOT 0.5 06/24/2012 1501   GFRNONAA 25* 06/12/2013 0655   GFRAA 28* 06/12/2013 0655     Time In Time Out Total Time Spent with Patient Total Overall Time  1100 1230 44mn 970m    Greater than 50%  of this time was spent counseling and coordinating care related to the above assessment and plan.  AlVinie SillNP Palliative Medicine Team Pager # [phone]-F 8a-5p) Team Phone # [phone]/Weekends)

## 2013-06-12 NOTE — Progress Notes (Signed)
PT Cancellation/Discharge Note  Patient Details Name: KOURTNIE SACHS MRN: [id number] DOB: 17-Feb-1927   Cancelled Treatment:    Reason Eval/Treat Not Completed: Other (comment) (sign off per family request).  Family requesting no more PT.  PT to sign off as family is pursuing palliative care.    Thanks,    Barbarann Ehlers. [place], [place], DPT [phone]   06/12/2013, 2:41 PM

## 2013-06-12 NOTE — Progress Notes (Signed)
Patient Name: [name]      SUBJECTIVE: much improved following lasix  Still wi couggh  Past Medical History  Diagnosis Date  . Benign neoplasm of colon   . Diverticulosis of colon (without mention of hemorrhage)   . Reflux esophagitis   . Gastric polyp     history of  . GERD (gastroesophageal reflux disease)   . Stricture and stenosis of esophagus   . Reactive airway disease   . Anemia, iron deficiency   . CHF (congestive heart failure)   . Restless leg syndrome   . Nonischemic cardiomyopathy     EF 20-25% 01-23-12 / BiV AICD  . Atrial fibrillation   . Diabetes mellitus     controlled with diet  . Osteoarthritis   . Hypertension   . UTI (lower urinary tract infection)   . Pacemaker CRT  BSx   . Chronic kidney disease (CKD), stage III (moderate)   . Hyperlipemia   . Anticoagulated on Coumadin   . Shortness of breath   . Asthma   . Alopecia  2/2 betablockers     Scheduled Meds:  Scheduled Meds: . amiodarone  400 mg Oral BID  . budesonide-formoterol  2 puff Inhalation Daily  . citalopram  10 mg Oral Daily  . dextromethorphan  15 mg Oral BID  . diclofenac sodium  4 g Topical QID  . furosemide  80 mg Intravenous BID  . irbesartan  37.5 mg Oral Daily  . ketotifen  2 drop Both Eyes BID  . loratadine  10 mg Oral Daily  . metoprolol succinate  50 mg Oral Daily  . omega-3 acid ethyl esters  1 g Oral Daily  . pantoprazole  40 mg Oral Daily  . rOPINIRole  1 mg Oral Daily  . sodium chloride  3 mL Intravenous Q12H  . sodium chloride  3 mL Intravenous Q12H  . tiotropium  18 mcg Inhalation Daily  . Vitamin D (Ergocalciferol)  50,000 Units Oral Q Tue  . Warfarin - Pharmacist Dosing Inpatient   Does not apply q1800   Continuous Infusions: . sodium chloride      PHYSICAL EXAM Filed Vitals:   06/11/13 2000 06/12/13 0611 06/12/13 0648 06/12/13 0756  BP: 120/72  116/70   Pulse: 68  76   Temp: 98.1 F (36.7 C)  98.6 F (37 C)   TempSrc: Oral  Oral     Resp: 20  19   Height:      Weight:  178 lb 4.8 oz (80.876 kg)    SpO2: 100%  100% 94%    General appearance: alert, cooperative and mild distress Neck: JVD - 10 cm above sternal notch Lungs: d3ecrease sounds right base otherwise with less ronchi Heart: regular rate and rhythm, S1, S2 [id number],9/6 murmur click, rub or gallop Abdomen: soft, non-tender; bowel sounds normal; no masses,  no organomegaly Extremities: edema 1+ Skin: Skin color, texture, turgor normal. No rashes or lesions Neurologic: Alert and oriented X 3, normal strength and tone. Normal symmetric reflexes. Normal coordination and gait  TELEMETRY: Reviewed telemetry pt in AV pacing    Intake/Output Summary (Last 24 hours) at 06/12/13 0757 Last data filed at 06/12/13 0600  Gross per 24 hour  Intake      0 ml  Output    950 ml  Net   -950 ml    LABS: Basic Metabolic Panel:  Recent Labs Lab 06/05/13 2220 06/07/13 0405 06/08/13 0320 06/09/13 0251 06/10/13  0410 06/11/13 1030  NA 140 137 136* 138 139 133*  K 4.6 4.7 4.0 3.8 4.3 6.1*  CL 102 99 98 98 99 99  CO2 21 22 22 24 23  16*  GLUCOSE 143* 104* 110* 95 102* 124*  BUN 42* 51* 55* 56* 62* 69*  CREATININE 1.66* 1.79* 1.92* 1.99* 2.02* 1.97*  CALCIUM 9.5 8.9 8.9 8.9 8.7 8.9   Cardiac Enzymes: No results found for this basename: CKTOTAL, CKMB, CKMBINDEX, TROPONINI,  in the last 72 hours CBC:  Recent Labs Lab 06/05/13 2220 06/07/13 0405 06/08/13 0320 06/09/13 0251 06/10/13 0410 06/11/13 0500 06/12/13 0655  WBC 13.5* 11.7* 13.0* 11.7* 12.5* 11.4* 11.0*  HGB 10.4* 10.3* 10.3* 10.1* 10.2* 10.0* 10.3*  HCT 31.6* 31.2* 31.0* 30.2* 30.7* 30.5* 31.6*  MCV 91.3 93.1 91.4 91.2 92.5 92.4 92.9  PLT 272 245 267 248 248 239 238   PROTIME:  Recent Labs  06/10/13 0410 06/11/13 0500 06/12/13 0655  LABPROT 28.0* 27.1* 25.3*  INR 2.73* 2.62* 2.39*   Liver Function Tests: No results found for this basename: AST, ALT, ALKPHOS, BILITOT, PROT, ALBUMIN,  in  the last 72 hours No results found for this basename: LIPASE, AMYLASE,  in the last 72 hours BNP: BNP (last 3 results)  Recent Labs  06/05/13 2220  PROBNP 13216.0*      ASSESSMENT AND PLAN:  Principal Problem:   Acute on chronic systolic heart failure Active Problems:   Atrial fibrillation   Acute on chronic renal failure   Cardiomyopathy, ischemic   Weakness generalized conintue iv diuresis  Renal functiojn pending For palliative care meeting today  Home asap    Signed, Virl Axe MD  06/12/2013

## 2013-06-12 NOTE — Progress Notes (Signed)
ANTICOAGULATION CONSULT NOTE - Follow Up Consult  Pharmacy Consult for Warfarin Indication: Hx Afib  Allergies  Allergen Reactions  . Iodinated Diagnostic Agents     Unknown    Patient Measurements: Height: 5\' 2"  (157.5 cm) Weight: 178 lb 4.8 oz (80.876 kg) IBW/kg (Calculated) : 50.1  Vital Signs: Temp: 98.6 F (37 C) (04/02 0648) Temp src: Oral (04/02 0648) BP: 116/70 mmHg (04/02 0648) Pulse Rate: 76 (04/02 0648)  Labs:  Recent Labs  06/10/13 0410 06/11/13 0500 06/11/13 1030 06/12/13 0655  HGB 10.2* 10.0*  --  10.3*  HCT 30.7* 30.5*  --  31.6*  PLT 248 239  --  238  LABPROT 28.0* 27.1*  --  25.3*  INR 2.73* 2.62*  --  2.39*  CREATININE 2.02*  --  1.97* 1.81*    Estimated Creatinine Clearance: 22.8 ml/min (by C-G formula based on Cr of 1.81).   Assessment: 80 y.o. F who continues on warfarin for hx Afib with a therapeutic INR today (INR 2.39 << 2.62, goal of 2-3). Hgb/Hct/Plt stable. No overt s/sx of bleeding noted. It is noted that amiodarone was started this morning -- will monitor closely for increased sensitivity. The patient and her family were re-educated on warfarin today.   Goal of Therapy:  INR 2-3   Plan:  1. Warfarin 3 mg x 1 dose at 1800 today 2. Will continue to monitor for any signs/symptoms of bleeding and will follow up with PT/INR in the a.m.   Alycia Rossetti, PharmD, BCPS Clinical Pharmacist Pager: [phone] 06/12/2013 1:31 PM

## 2013-06-13 ENCOUNTER — Encounter (HOSPITAL_COMMUNITY): Payer: Self-pay | Admitting: Physician Assistant

## 2013-06-13 ENCOUNTER — Inpatient Hospital Stay (HOSPITAL_COMMUNITY): Payer: Medicare Other

## 2013-06-13 LAB — BASIC METABOLIC PANEL
BUN: 68 mg/dL — AB (ref 6–23)
CHLORIDE: 97 meq/L (ref 96–112)
CO2: 24 mEq/L (ref 19–32)
Calcium: 8.6 mg/dL (ref 8.4–10.5)
Creatinine, Ser: 1.9 mg/dL — ABNORMAL HIGH (ref 0.50–1.10)
GFR calc Af Amer: 27 mL/min — ABNORMAL LOW (ref >90)
GFR, EST NON AFRICAN AMERICAN: 23 mL/min — AB (ref >90)
GLUCOSE: 92 mg/dL (ref 70–99)
Potassium: 4.2 mEq/L (ref 3.7–5.3)
Sodium: 136 mEq/L — ABNORMAL LOW (ref 137–147)

## 2013-06-13 LAB — PROTIME-INR
INR: 2.73 — ABNORMAL HIGH (ref 0.00–1.49)
Prothrombin Time: 28 seconds — ABNORMAL HIGH (ref 11.6–15.2)

## 2013-06-13 LAB — CBC
HEMATOCRIT: 32.4 % — AB (ref 36.0–46.0)
Hemoglobin: 10.5 g/dL — ABNORMAL LOW (ref 12.0–15.0)
MCH: 30.3 pg (ref 26.0–34.0)
MCHC: 32.4 g/dL (ref 30.0–36.0)
MCV: 93.6 fL (ref 78.0–100.0)
Platelets: 241 10*3/uL (ref 150–400)
RBC: 3.46 MIL/uL — ABNORMAL LOW (ref 3.87–5.11)
RDW: 15.2 % (ref 11.5–15.5)
WBC: 12.9 10*3/uL — ABNORMAL HIGH (ref 4.0–10.5)

## 2013-06-13 MED ORDER — LIDOCAINE 5 % EX PTCH: 2.0000 | MEDICATED_PATCH | CUTANEOUS | Status: DC

## 2013-06-13 MED ORDER — BENZONATATE 200 MG PO CAPS: 200.0000 mg | ORAL_CAPSULE | Freq: Three times a day (TID) | ORAL | Status: DC | PRN

## 2013-06-13 MED ORDER — AMIODARONE HCL 200 MG PO TABS: 400.0000 mg | ORAL_TABLET | Freq: Two times a day (BID) | ORAL | Status: DC

## 2013-06-13 MED ORDER — FUROSEMIDE 10 MG/ML IJ SOLN
80.0000 mg | Freq: Two times a day (BID) | INTRAMUSCULAR | Status: DC
  Administered 2013-06-13: 80 mg via INTRAVENOUS
  Filled 2013-06-13: qty 8

## 2013-06-13 MED ORDER — DEXTROMETHORPHAN POLISTIREX 30 MG/5ML PO LQCR: 15.0000 mg | Freq: Two times a day (BID) | ORAL | Status: DC

## 2013-06-13 MED ORDER — DIAZEPAM 2 MG PO TABS: 2.0000 mg | ORAL_TABLET | Freq: Two times a day (BID) | ORAL | Status: DC | PRN

## 2013-06-13 MED ORDER — POTASSIUM CHLORIDE CRYS ER 20 MEQ PO TBCR
20.0000 meq | EXTENDED_RELEASE_TABLET | Freq: Every day | ORAL | Status: DC
  Administered 2013-06-13 – 2013-06-16 (×4): 20 meq via ORAL
  Filled 2013-06-13 (×5): qty 1

## 2013-06-13 MED ORDER — MORPHINE SULFATE (CONCENTRATE) 10 MG /0.5 ML PO SOLN: 5.0000 mg | ORAL | Status: DC | PRN

## 2013-06-13 MED ORDER — METOPROLOL SUCCINATE ER 25 MG PO TB24
25.0000 mg | ORAL_TABLET | Freq: Every day | ORAL | Status: DC
  Administered 2013-06-13 – 2013-06-16 (×4): 25 mg via ORAL
  Filled 2013-06-13 (×4): qty 1

## 2013-06-13 MED ORDER — FUROSEMIDE 40 MG PO TABS: 80.0000 mg | ORAL_TABLET | Freq: Two times a day (BID) | ORAL | Status: DC

## 2013-06-13 MED ORDER — FUROSEMIDE 80 MG PO TABS
80.0000 mg | ORAL_TABLET | Freq: Two times a day (BID) | ORAL | Status: DC
  Administered 2013-06-13 – 2013-06-14 (×2): 80 mg via ORAL
  Filled 2013-06-13 (×4): qty 1

## 2013-06-13 MED ORDER — WARFARIN 0.5 MG HALF TABLET
1.5000 mg | ORAL_TABLET | Freq: Once | ORAL | Status: AC
  Administered 2013-06-13: 1.5 mg via ORAL
  Filled 2013-06-13: qty 1

## 2013-06-13 NOTE — Progress Notes (Signed)
Progress Note from the Palliative Medicine Team at [facility]  Subjective: [name] is awake and alert and smiling this morning. She tells me that she ate some oatmeal, half a banana, a muffin, and drank all her milk this morning. She reports that she slept great last night and feels much better. She has not required the roxanol since last night ~2030 and reports the Valium and roxanol has worked well for her yesterday. She has had no more coughing episodes since our meeting yesterday morning and this was her main concern. I will continue to follow and support this patient and family. Plan continues to be home with hospice.     Objective: Allergies  Allergen Reactions  . Iodinated Diagnostic Agents     Unknown   Scheduled Meds: . amiodarone  400 mg Oral BID  . budesonide-formoterol  2 puff Inhalation Daily  . citalopram  10 mg Oral Daily  . dextromethorphan  15 mg Oral BID  . diclofenac sodium  4 g Topical QID  . furosemide  80 mg Intravenous BID  . irbesartan  37.5 mg Oral Daily  . ketotifen  2 drop Both Eyes BID  . lidocaine  2 patch Transdermal Q24H  . loratadine  10 mg Oral Daily  . metoprolol succinate  25 mg Oral Daily  . omega-3 acid ethyl esters  1 g Oral Daily  . pantoprazole  40 mg Oral Daily  . rOPINIRole  1 mg Oral Daily  . sodium chloride  3 mL Intravenous Q12H  . sodium chloride  3 mL Intravenous Q12H  . tiotropium  18 mcg Inhalation Daily  . Vitamin D (Ergocalciferol)  50,000 Units Oral Q Tue  . warfarin  1.5 mg Oral ONCE-1800  . Warfarin - Pharmacist Dosing Inpatient   Does not apply q1800   Continuous Infusions: . sodium chloride     PRN Meds:.sodium chloride, acetaminophen, benzonatate, diazepam, morphine CONCENTRATE, sodium chloride, sodium chloride, sorbitol  BP 96/81  Pulse 68  Temp(Src) 97.6 F (36.4 C) (Oral)  Resp 20  Ht 5\' 2"  (1.575 m)  Wt 63.912 kg (140 lb 14.4 oz)  BMI 25.76 kg/m2  SpO2 98%  LMP 01/10/2012   PPS:  30%     Intake/Output Summary (Last 24 hours) at 06/13/13 0947 Last data filed at 06/12/13 1500  Gross per 24 hour  Intake    120 ml  Output    375 ml  Net   -255 ml      LBM: 06/09/13      Physical Exam:  General: NAD, awake, alert  HEENT: [place]/AT, + JVD, moist mucous membranes  Chest: Improved air movement, decreased right base  CVS: RRR, S1 S2  Abdomen: Soft, NT, ND, +BS  Ext: MAE, BLE edema improved, warm to touch  Neuro: Alert, oriented x 3, follows commands   Labs: CBC    Component Value Date/Time   WBC 12.9* 06/13/2013 0303   RBC 3.46* 06/13/2013 0303   HGB 10.5* 06/13/2013 0303   HCT 32.4* 06/13/2013 0303   PLT 241 06/13/2013 0303   MCV 93.6 06/13/2013 0303   MCH 30.3 06/13/2013 0303   MCHC 32.4 06/13/2013 0303   RDW 15.2 06/13/2013 0303   LYMPHSABS 1.2 05/26/2013 1056   MONOABS 0.7 05/26/2013 1056   EOSABS 0.1 05/26/2013 1056   BASOSABS 0.0 05/26/2013 1056    BMET    Component Value Date/Time   NA 136* 06/13/2013 0303   K 4.2 06/13/2013 0303   CL 97 06/13/2013  0303   CO2 24 06/13/2013 0303   GLUCOSE 92 06/13/2013 0303   BUN 68* 06/13/2013 0303   CREATININE 1.90* 06/13/2013 0303   CALCIUM 8.6 06/13/2013 0303   GFRNONAA 23* 06/13/2013 0303   GFRAA 27* 06/13/2013 0303    CMP     Component Value Date/Time   NA 136* 06/13/2013 0303   K 4.2 06/13/2013 0303   CL 97 06/13/2013 0303   CO2 24 06/13/2013 0303   GLUCOSE 92 06/13/2013 0303   BUN 68* 06/13/2013 0303   CREATININE 1.90* 06/13/2013 0303   CALCIUM 8.6 06/13/2013 0303   PROT 6.6 06/24/2012 1501   ALBUMIN 3.3* 06/24/2012 1501   AST 54* 06/24/2012 1501   ALT 44* 06/24/2012 1501   ALKPHOS 138* 06/24/2012 1501   BILITOT 0.5 06/24/2012 1501   GFRNONAA 23* 06/13/2013 0303   GFRAA 27* 06/13/2013 0303     Assessment and Plan: 1. Code Status: DNR 2. Symptom Control:  1. Anxiety/Agitation: Valium 2 mg prn for anxiety, muscle spasms, or sleep. May also help with her pain around ribs r/t coughing episodes.  2. Pain: Roxanol 5 mg prn for pain or dyspnea.  Lidoderm patch to neck and lower back.  3. Bowel Regimen: Sorbitol prn.  4. Fever: Acetaminophen prn.  3. Psycho/Social: Emotional support provided to patient and family.  4. Disposition: Home with hospice.    Time In Time Out Total Time Spent with Patient Total Overall Time  0900 0940 70min 33min    Greater than 50%  of this time was spent counseling and coordinating care related to the above assessment and plan.  Vinie Sill, NP Palliative Medicine Team Pager # [phone] (M-F 8a-5p) Team Phone # [phone] (Nights/Weekends)

## 2013-06-13 NOTE — Progress Notes (Signed)
Nutrition Brief Note  Patient identified on the Malnutrition Screening Tool (MST) Report  Wt Readings from Last 15 Encounters:  06/13/13 140 lb 14.4 oz (63.912 kg)  06/13/13 140 lb 14.4 oz (63.912 kg)  05/26/13 164 lb (74.39 kg)  07/23/12 161 lb (73.029 kg)  05/21/12 151 lb 14.4 oz (68.9 kg)  05/02/12 153 lb 1.9 oz (69.455 kg)  04/18/12 158 lb (71.668 kg)  02/26/12 162 lb (73.483 kg)  02/21/12 162 lb (73.483 kg)  02/12/12 162 lb 4.8 oz (73.619 kg)  02/12/12 162 lb 4.8 oz (73.619 kg)  02/12/12 162 lb 4.8 oz (73.619 kg)  02/12/12 162 lb 4.8 oz (73.619 kg)  10/17/11 180 lb 11.2 oz (81.965 kg)  01/05/11 179 lb (81.194 kg)    Body mass index is 25.76 kg/(m^2). Patient meets criteria for overweight based on current BMI.   Current diet order is heart healthy, patient is consuming approximately 75-100% of meals at this time. Labs and medications reviewed.   Per pt and daughter, pt's weight is recorded incorrectly. They say that her usual body weight is around 165 lbs when she does not have fluid retention. They both say that she has had no recent weight loss, and that she is eating well.  No nutrition interventions warranted at this time. If nutrition issues arise, please consult RD.   Terrace Arabia RD, LDN

## 2013-06-13 NOTE — Progress Notes (Signed)
ANTICOAGULATION CONSULT NOTE - Follow Up Consult  Pharmacy Consult for Coumadin Indication: atrial fibrillation  Allergies  Allergen Reactions  . Iodinated Diagnostic Agents     Unknown    Patient Measurements: Height: 5\' 2"  (157.5 cm) Weight: 140 lb 14.4 oz (63.912 kg) IBW/kg (Calculated) : 50.1  Vital Signs: Temp: 97.6 F (36.4 C) (04/03 0611) Temp src: Oral (04/03 0611) BP: 96/81 mmHg (04/03 0611) Pulse Rate: 68 (04/03 0611)  Labs:  Recent Labs  06/11/13 0500 06/11/13 1030 06/12/13 0655 06/13/13 0303  HGB 10.0*  --  10.3* 10.5*  HCT 30.5*  --  31.6* 32.4*  PLT 239  --  238 241  LABPROT 27.1*  --  25.3* 28.0*  INR 2.62*  --  2.39* 2.73*  CREATININE  --  1.97* 1.81* 1.90*    Estimated Creatinine Clearance: 19.3 ml/min (by C-G formula based on Cr of 1.9).  Assessment: 86yof continues on coumadin for afib. INR is therapeutic but is starting to trend up with addition of amiodarone 4/2. Will need to watch for increase sensitivity. CBC is stable. No bleeding reported.  Home dose: coumadin 3mg  daily - dose at discharge likely to decrease with new amiodarone  Goal of Therapy:  INR 2-3 Monitor platelets by anticoagulation protocol: Yes   Plan:  1) Will proactively decrease tonight's coumadin dose to 1.5mg  x 1 2) INR in AM  Deboraha Sprang 06/13/2013,8:31 AM

## 2013-06-13 NOTE — Progress Notes (Signed)
Patient Name: [name]      SUBJECTIVE: feels better with less cough Plan was to go home with hospice  Per pt and nursing as there are no notes available to this poor reader  Past Medical History  Diagnosis Date  . Benign neoplasm of colon   . Diverticulosis of colon (without mention of hemorrhage)   . Reflux esophagitis   . Gastric polyp     history of  . GERD (gastroesophageal reflux disease)   . Stricture and stenosis of esophagus   . Reactive airway disease   . Anemia, iron deficiency   . CHF (congestive heart failure)   . Restless leg syndrome   . Nonischemic cardiomyopathy     EF 20-25% 01-23-12 / BiV AICD  . Atrial fibrillation   . Diabetes mellitus     controlled with diet  . Osteoarthritis   . Hypertension   . UTI (lower urinary tract infection)   . Pacemaker CRT  BSx   . Chronic kidney disease (CKD), stage III (moderate)   . Hyperlipemia   . Anticoagulated on Coumadin   . Shortness of breath   . Asthma   . Alopecia  2/2 betablockers     Scheduled Meds:  Scheduled Meds: . amiodarone  400 mg Oral BID  . budesonide-formoterol  2 puff Inhalation Daily  . citalopram  10 mg Oral Daily  . dextromethorphan  15 mg Oral BID  . diclofenac sodium  4 g Topical QID  . furosemide  80 mg Intravenous BID  . irbesartan  37.5 mg Oral Daily  . ketotifen  2 drop Both Eyes BID  . lidocaine  2 patch Transdermal Q24H  . loratadine  10 mg Oral Daily  . metoprolol succinate  50 mg Oral Daily  . omega-3 acid ethyl esters  1 g Oral Daily  . pantoprazole  40 mg Oral Daily  . rOPINIRole  1 mg Oral Daily  . sodium chloride  3 mL Intravenous Q12H  . sodium chloride  3 mL Intravenous Q12H  . tiotropium  18 mcg Inhalation Daily  . Vitamin D (Ergocalciferol)  50,000 Units Oral Q Tue  . Warfarin - Pharmacist Dosing Inpatient   Does not apply q1800   Continuous Infusions: . sodium chloride      PHYSICAL EXAM Filed Vitals:   06/12/13 0756 06/12/13 1355  06/12/13 2121 06/13/13 0611  BP:  110/73 88/54 96/81   Pulse:  109 71 68  Temp:  98.6 F (37 C) 97.6 F (36.4 C) 97.6 F (36.4 C)  TempSrc:  Oral Oral Oral  Resp:  20 20 20   Height:      Weight:    140 lb 14.4 oz (63.912 kg)  SpO2: 94% 96% 96% 98%    General appearance: alert, cooperative and no distress Neck: JVD - 10 cm above sternal notch Lungs: clear on Left and markedly decreased on R Heart: regular rate and rhythm, S1, S2 normal, no murmur, click, rub or gallop Extremities: extremities normal, atraumatic, no cyanosis or edema Pulses: 2+ and symmetric Skin: Skin color, texture, turgor normal. No rashes or lesions Neurologic: Alert and oriented X 3, normal strength and tone. Normal symmetric reflexes. Normal coordination and gait  TELEMETRY: Reviewed telemetry pt in  :AV pqacing    Intake/Output Summary (Last 24 hours) at 06/13/13 0646 Last data filed at 06/12/13 1500  Gross per 24 hour  Intake    240 ml  Output  375 ml  Net   -135 ml    LABS: Basic Metabolic Panel:  Recent Labs Lab 06/07/13 0405 06/08/13 0320 06/09/13 0251 06/10/13 0410 06/11/13 1030 06/12/13 0655 06/13/13 0303  NA 137 136* 138 139 133* 139 136*  K 4.7 4.0 3.8 4.3 6.1* 4.3 4.2  CL 99 98 98 99 99 101 97  CO2 22 22 24 23  16* 22 24  GLUCOSE 104* 110* 95 102* 124* 93 92  BUN 51* 55* 56* 62* 69* 68* 68*  CREATININE 1.79* 1.92* 1.99* 2.02* 1.97* 1.81* 1.90*  CALCIUM 8.9 8.9 8.9 8.7 8.9 8.9 8.6   Cardiac Enzymes: No results found for this basename: CKTOTAL, CKMB, CKMBINDEX, TROPONINI,  in the last 72 hours CBC:  Recent Labs Lab 06/07/13 0405 06/08/13 0320 06/09/13 0251 06/10/13 0410 06/11/13 0500 06/12/13 0655 06/13/13 0303  WBC 11.7* 13.0* 11.7* 12.5* 11.4* 11.0* 12.9*  HGB 10.3* 10.3* 10.1* 10.2* 10.0* 10.3* 10.5*  HCT 31.2* 31.0* 30.2* 30.7* 30.5* 31.6* 32.4*  MCV 93.1 91.4 91.2 92.5 92.4 92.9 93.6  PLT 245 267 248 248 239 238 241   PROTIME:  Recent Labs  06/11/13 0500  06/12/13 0655 06/13/13 0303  LABPROT 27.1* 25.3* 28.0*  INR 2.62* 2.39* 2.73*   Liver Function Tests: No results found for this basename: AST, ALT, ALKPHOS, BILITOT, PROT, ALBUMIN,  in the last 72 hours No results found for this basename: LIPASE, AMYLASE,  in the last 72 hours BNP: BNP (last 3 results)  Recent Labs  06/05/13 2220  PROBNP 13216.0*      ASSESSMENT AND PLAN:  Principal Problem:   Acute on chronic systolic heart failure Active Problems:   Atrial fibrillation   Acute on chronic renal failure   Cardiomyopathy, ischemic   Weakness generalized   Palliative care encounter  Will check CXR down stairs, i wonder whether theeffusion is large enuf to be tapped  Anticipate discharge to home on lasix 80 bid Increae HR on device >>80 amio 400 biod x 2weeks then 400 daily F/u SL 10days ( add on) Decrease toprol>>25 2/2 hypotension OUTPUTS ARE NOT CLEAR  NURSING SAID SHE HAS BUT I/O ARE THEN WOEFULLY INCOMPLETE   Signed, Virl Axe MD  06/13/2013

## 2013-06-13 NOTE — Progress Notes (Signed)
Rate change from 70 to 80 (DDDR) per Chanetta Marshall with [employer].  Device check shows normal function. Atrial lead:  0.45mV, 419, 0.8v@0 .49ms RV lead:  7.66mV, 434, 0.8V@0 .69ms LV lead:  6.50mV, 436, 1.4V@0 .33ms  [employer] [phone]  Cosigned by: Tarri Fuller, PA-C

## 2013-06-13 NOTE — Progress Notes (Signed)
   SUBJECTIVE: Attempted to discharge patient today per Dr Olin Pia order.  However, patient's family states they are not ready to bring her home and will need until Monday to make preparations.  Despite multiple conversations, daughter is stating that she is unable to take patient home until Monday.  Discussed with Rosaria Ferries, PA and Cristopher Peru, MD - new orders written.    Family encouraged to make arrangements as soon as possible to allow patient to return home with hospice as per plan outlined by Dr Caryl Comes.   Chanetta Marshall, RN, BSN, CCDS 06/13/2013 12:08 PM

## 2013-06-13 NOTE — Progress Notes (Signed)
06-13-13 [address] Jacqlyn Krauss, RN,BSN [phone] CM did call On call RN from [facility] at [phone] d/t daughter [name] has questions about RN skill set, how many hours per day RN would visit for and the overall resources that Hospice would provide. [name] to call daughter with answers to above questions. CM did provide pt with the IM and pt's daughter did call Medicare to appeal the case. CM did call Gay Filler with  Care Management to make her aware. Bethena Roys, RN,BSN [phone].

## 2013-06-13 NOTE — Progress Notes (Signed)
OT Cancellation Note  Patient Details Name: LEVANA MINETTI MRN: [id number] DOB: [birth year]   Cancelled Treatment:    Reason Eval/Treat Not Completed: Other (comment) Note from PT that family pursuing palliative care. Will sign off for OT also.  Jules Schick [phone] 06/13/2013, 9:28 AM

## 2013-06-13 NOTE — Consult Note (Signed)
I have reviewed this case with our NP and agree with the Assessment and Plan as stated.  Dhairya Corales L. Eskridge, MD MBA The Palliative Medicine Team at [facility] Team Phone: [phone] Pager: [phone]   

## 2013-06-13 NOTE — Discharge Summary (Deleted)
CARDIOLOGY DISCHARGE SUMMARY   Patient ID: BERKELEY CARDIEL MRN: [id number] DOB/AGE: 10-16-1926 80 y.o.  Admit date: 06/05/2013 Discharge date: 06/16/2013  PCP: Donnajean Lopes, MD Primary Cardiologist:  Dr. Caryl Comes  Primary Discharge Diagnosis:  Acute on chronic systolic heart failure Secondary Discharge Diagnosis:    Weakness generalized   Acute on chronic renal failure   Cardiomyopathy, ischemic - [employer] BiV pacemaker, DDDR mode base rate 80   Atrial fibrillation   Palliative care encounter   DO NOT RESUSCITATE   Chronic anticoagulation w/ coumadin  Consults: palliative care   Procedures: pacemaker interrogation and changed settings  Hospital Course: [name] is a 80 y.o. female with a history of CAD. She came to the emergency room on 3/27 with increasing shortness of breath and atrial fibrillation. She was admitted for recurrent atrial fibrillation and CHF.  She was felt not to be tolerating the atrial fibrillation very well. A TEE cardioversion was scheduled. The TEE was performed, results are below. She received sedation by anesthesia but became significantly hypotensive and hypoxic. She was given Neo-Synephrine. She had no LAA thrombus so after her blood pressure improved, she  was able to be cardioverted. It resulted in sinus rhythm. She had been taken off amiodarone in the past because of elevated transaminases. However, Dr. Caryl Comes reviewed the situation with the patient and her family. There was concern regarding end-of-life issues and it was felt that her respiratory status would worsen if she were not in sinus rhythm. Therefore, the decision was made to restart amiodarone and follow her closely. Her Coumadin was therapeutic and remained so. This will be followed as an outpatient.  She was initially admitted with CHF. She was initially diuresed with IV Lasix at 80 mg twice a day. However, her creatinine worsened and she was felt to have acute on chronic renal  failure. The Lasix was held until after the TEE cardioversion. It was then restarted because of dyspnea on exertion and hypoxia. She will be discharged on Lasix 80 mg twice a day. There was concern for recurrence of a pleural effusion, and her chest x-ray was rechecked. Results are below, but the effusion has not returned.  She had problems with hypotension because of her medications, including Toprol-XL. The dose was decreased and hypotension improved. Her left ventricular function is poor. She was able to continue receiving a low dose of her ARB.  She had significant generalized weakness. Initially the plan was to pursue physical therapy as she was felt the condition from her long history of heart failure. However, after a long discussion with Dr. Caryl Comes, the decision was made to pursue palliative care.  [name] was made a DO NOT RESUSCITATE. Palliative care with consult was called. She was seen by them and recommendations were made. They will continue to follow her as an outpatient. The emphasis will be on comfort care. She was started on Roxanol and Valium to help control symptoms.   She was having problems with incontinence and a foley catheter was placed. This will be left in place after d/c and she will follow up with primary care or Hospice MD for further management.   By 4/6, all arrangements have been made and no further inpatient workup is recommended. She was seen by Dr. Caryl Comes and by palliative care. All data were reviewed and discharge medications were outlined. No further hospitalization is needed and she is considered stable for discharge, to follow up as an outpatient.   Labs:  Lab Results  Component Value Date   WBC 10.0 06/16/2013   HGB 10.3* 06/16/2013   HCT 32.0* 06/16/2013   MCV 93.0 06/16/2013   PLT 230 06/16/2013     Recent Labs Lab 06/16/13 0408  NA 136*  K 5.2  CL 97  CO2 27  BUN 69*  CREATININE 1.75*  CALCIUM 8.6  GLUCOSE 90   Pro B Natriuretic peptide (BNP)    Date/Time Value Ref Range Status  06/05/2013 10:20 PM 13216.0* 0 - 450 pg/mL Final  05/17/2012  6:55 AM 3145.0* 0 - 450 pg/mL Final    Recent Labs  06/16/13 0408  INR 2.44*      Radiology: Dg Chest 1 View 06/11/2013   CLINICAL DATA:  80 year old female with cough. Initial encounter.  EXAM: CHEST - 1 VIEW  COMPARISON:  06/05/2013 and earlier.  FINDINGS: Portable AP semi upright view at 1003 hrs. Stable cardiomegaly and mediastinal contours. Stable left chest cardiac AICD. Bilateral pleural effusions more apparent. Appear small overall. No pneumothorax. Pulmonary vascularity is stable without overt edema. No definite consolidation.  IMPRESSION: Small but increased bilateral pleural effusions. No other acute cardiopulmonary abnormality identified.   Electronically Signed   By: Lars Pinks M.D.   On: 06/11/2013 12:42   Dg Chest 2 View 06/13/2013   CLINICAL DATA:  Shortness of breath.  EXAM: CHEST  2 VIEW  COMPARISON:  06/11/2013  FINDINGS: Patient's left-sided transvenous pacemaker/ AICD with leads overlying right atrium, right ventricle, and coronary sinus. The heart is enlarged. There is mild perihilar peribronchial thickening which appear stable. No focal consolidations, pleural effusions, or pulmonary edema. Bones appear osteopenic. Several thoracic wedge compression fractures are again noted. No suspicious lytic or blastic lesions are seen. There are chronic changes in both shoulders.  IMPRESSION: 1. Cardiomegaly without evidence for pulmonary edema. 2.  No focal acute pulmonary abnormality.   Electronically Signed   By: Shon Hale M.D.   On: 06/13/2013 09:06   Dg Chest 2 View 06/06/2013   CLINICAL DATA:  Atrial fibrillation. Shortness of breath. Cough and congestion.  EXAM: CHEST  2 VIEW  COMPARISON:  Chest x-ray 05/16/2012.  FINDINGS: Lung volumes are normal. No consolidative airspace disease. No pleural effusions. No evidence of pulmonary edema. Mild pulmonary venous congestion. Mild cardiomegaly.  Atherosclerosis in the thoracic aorta. Left-sided biventricular pacemaker/AICD with lead tips projecting over the expected location of the right atrium, overlying the lateral wall of the left ventricle via the coronary sinus and coronary veins, and in the right ventricular apex. Small calcified nodule in the lateral aspect of the right mid lung, similar to numerous prior examinations, most compatible with a calcified granuloma. Multiple old vertebral body compression fractures throughout the mid to lower thoracic spine, unchanged compared to prior study 06/10/2010.  IMPRESSION: 1. Mild cardiomegaly with mild pulmonary venous congestion, but no frank pulmonary edema. 2. Atherosclerosis.   Electronically Signed   By: Vinnie Langton M.D.   On: 06/06/2013 00:00    EKG: sinus rhythm with ventricular pacing, some AV pacing and PVCs  Transesophageal echocardiogram: 06/09/2013 Study Conclusions - Left ventricle: The estimated ejection fraction was 15%. Diffuse hypokinesis. No evidence of thrombus. - Mitral valve: There was a vegetation. Severe regurgitation. - Left atrium: The atrium was severely dilated. No evidence of thrombus in the atrial cavity or appendage. - Right ventricle: Systolic function was moderately reduced. - Right atrium: The atrium was mildly to moderately dilated. - Atrial septum: There was an atrial septal aneurysm. - Tricuspid  valve: Severe regurgitation. Impressions: - Limited study as patient developed hypotension and hypoxia during the procedure following sedation by anesthesia (required transient bagging and bolus of neosynephrine); therefore terminated prematurely. Severe LV dysfunction, no LAA thrombus; severe MR and TR. No cardiac source of emboli was indentified. Successful cardioversion.  Echo: 06/06/2013 Conclusions    - Left ventricle: The cavity size was severely dilated. Wall thickness was normal. The estimated ejection fraction was 20%. Diffuse  hypokinesis. - Aortic valve: Sclerosis without stenosis. Trivial regurgitation. - Mitral valve: Calcified annulus. Moderate regurgitation. - Left atrium: The atrium was severely dilated. - Right ventricle: The cavity size was mildly dilated. Pacer wire or catheter noted in right ventricle. Systolic function was mildly reduced. - Tricuspid valve: Mild regurgitation. - Pulmonary arteries: PA peak pressure: 24mm Hg (S).    FOLLOW UP PLANS AND APPOINTMENTS Allergies  Allergen Reactions  . Iodinated Diagnostic Agents     Unknown     Medication List         acetaminophen 325 MG tablet  Commonly known as:  TYLENOL  Take 650 mg by mouth every 6 (six) hours as needed. For pain/fever     amiodarone 200 MG tablet  Commonly known as:  PACERONE  Take 2 tablets (400 mg total) by mouth 2 (two) times daily. Take 2 tablets twice a day for 2 weeks, then 2 tablets daily     azelastine 0.05 % ophthalmic solution  Commonly known as:  OPTIVAR  Place 1 drop into both eyes 2 (two) times daily.     benzonatate 200 MG capsule  Commonly known as:  TESSALON  Take 1 capsule (200 mg total) by mouth 3 (three) times daily as needed for cough.     bisacodyl 10 MG suppository  Commonly known as:  DULCOLAX  Place 1 suppository (10 mg total) rectally daily as needed for moderate constipation.     budesonide-formoterol 160-4.5 MCG/ACT inhaler  Commonly known as:  SYMBICORT  Inhale 2 puffs into the lungs daily.     citalopram 10 MG tablet  Commonly known as:  CELEXA  Take 1 tablet (10 mg total) by mouth daily.     desloratadine 5 MG tablet  Commonly known as:  CLARINEX  Take 5 mg by mouth daily as needed (for allergies).     dextromethorphan 30 MG/5ML liquid  Commonly known as:  DELSYM  Take 2.5 mLs (15 mg total) by mouth 2 (two) times daily.     diazepam 2 MG tablet  Commonly known as:  VALIUM  Take 1 tablet (2 mg total) by mouth every 12 (twelve) hours as needed for anxiety or muscle spasms  (sleep).     EYE VITAMINS Caps  Take by mouth. i caps 2 tabs a day     fish oil-omega-3 fatty acids 1000 MG capsule  Take 1 g by mouth daily.     furosemide 40 MG tablet  Commonly known as:  LASIX  Take 2 tablets (80 mg total) by mouth 2 (two) times daily.     irbesartan 75 MG tablet  Commonly known as:  AVAPRO  Take 0.5 tablets (37.5 mg total) by mouth daily.     lansoprazole 30 MG capsule  Commonly known as:  PREVACID  Take 30 mg by mouth daily.     lidocaine 5 %  Commonly known as:  LIDODERM  Place 2 patches onto the skin daily. Remove & Discard patch within 12 hours or as directed by MD  metoprolol succinate 25 MG 24 hr tablet  Commonly known as:  TOPROL-XL  Take 1 tablet (25 mg total) by mouth daily. Take with or immediately following a meal.     morphine CONCENTRATE 10 mg / 0.5 ml concentrated solution  Take 0.25 mLs (5 mg total) by mouth every 4 (four) hours as needed for moderate pain or shortness of breath.     rOPINIRole 1 MG tablet  Commonly known as:  REQUIP  Take 1 tablet (1 mg total) by mouth daily.     SPIRIVA HANDIHALER IN  Inhale 18 mcg into the lungs daily.     Vitamin D (Ergocalciferol) 50000 UNITS Caps capsule  Commonly known as:  DRISDOL  Take 50,000 Units by mouth every 7 (seven) days. Tuesday     warfarin 3 MG tablet  Commonly known as:  COUMADIN  Take 1 tablet (3 mg total) by mouth daily.         Future Appointments Provider Department Dept Phone   06/24/2013 3:45 PM Deboraha Sprang, MD [facility] [place] Office [phone]     Follow-up Information   Follow up with Hospice and [facility]. Occupational hygienist)    Sport and exercise psychologist information:   [place] [id number] [phone]      BRING ALL MEDICATIONS WITH YOU TO FOLLOW UP APPOINTMENTS  Time spent with patient to include physician time: 48 min Signed: Rosaria Ferries, PA-C 06/16/2013, 10:08 AM Co-Sign MD   Saw pt independtly this  am Renal function was stable She is not ambulating much, BP is low Her desire is to go h ome with foley

## 2013-06-14 LAB — BASIC METABOLIC PANEL
BUN: 71 mg/dL — AB (ref 6–23)
CALCIUM: 8.5 mg/dL (ref 8.4–10.5)
CO2: 28 mEq/L (ref 19–32)
CREATININE: 1.84 mg/dL — AB (ref 0.50–1.10)
Chloride: 96 mEq/L (ref 96–112)
GFR, EST AFRICAN AMERICAN: 28 mL/min — AB (ref >90)
GFR, EST NON AFRICAN AMERICAN: 24 mL/min — AB (ref >90)
GLUCOSE: 102 mg/dL — AB (ref 70–99)
Potassium: 4.7 mEq/L (ref 3.7–5.3)
Sodium: 138 mEq/L (ref 137–147)

## 2013-06-14 LAB — CBC
HEMATOCRIT: 31.7 % — AB (ref 36.0–46.0)
HEMOGLOBIN: 10.2 g/dL — AB (ref 12.0–15.0)
MCH: 30 pg (ref 26.0–34.0)
MCHC: 32.2 g/dL (ref 30.0–36.0)
MCV: 93.2 fL (ref 78.0–100.0)
Platelets: 230 10*3/uL (ref 150–400)
RBC: 3.4 MIL/uL — ABNORMAL LOW (ref 3.87–5.11)
RDW: 14.9 % (ref 11.5–15.5)
WBC: 13.8 10*3/uL — ABNORMAL HIGH (ref 4.0–10.5)

## 2013-06-14 LAB — PROTIME-INR
INR: 2.72 — ABNORMAL HIGH (ref 0.00–1.49)
Prothrombin Time: 27.9 seconds — ABNORMAL HIGH (ref 11.6–15.2)

## 2013-06-14 MED ORDER — WARFARIN SODIUM 1 MG PO TABS
1.5000 mg | ORAL_TABLET | Freq: Once | ORAL | Status: AC
  Administered 2013-06-14: 1.5 mg via ORAL
  Filled 2013-06-14: qty 1

## 2013-06-14 NOTE — Progress Notes (Signed)
Pt incontinent of urine, bed/linen was soaked in urine.  The lidocaine patches that were placed on lower back earlier today had to be removed because they were also soaked in urine.  MAR updated.

## 2013-06-14 NOTE — Progress Notes (Signed)
ANTICOAGULATION CONSULT NOTE - Follow Up Consult  Pharmacy Consult for Coumadin Indication: atrial fibrillation  Allergies  Allergen Reactions  . Iodinated Diagnostic Agents     Unknown    Patient Measurements: Height: 5\' 2"  (157.5 cm) Weight: 163 lb 12.8 oz (74.299 kg) IBW/kg (Calculated) : 50.1  Vital Signs: Temp: 97.5 F (36.4 C) (04/04 0600) Temp src: Oral (04/04 0600) BP: 102/61 mmHg (04/04 0600) Pulse Rate: 80 (04/04 0600)  Labs:  Recent Labs  06/12/13 0655 06/13/13 0303 06/14/13 0400  HGB 10.3* 10.5* 10.2*  HCT 31.6* 32.4* 31.7*  PLT 238 241 230  LABPROT 25.3* 28.0* 27.9*  INR 2.39* 2.73* 2.72*  CREATININE 1.81* 1.90* 1.84*    Estimated Creatinine Clearance: 21.5 ml/min (by C-G formula based on Cr of 1.84).  Assessment: 86yof continues on coumadin for afib. INR is therapeutic. Will need to watch for increase sensitivity with addition of amiodarone (started 4/2). CBC is stable. No bleeding reported.  Home dose: coumadin 3mg  daily - dose at discharge likely to decrease with new amiodarone  Goal of Therapy:  INR 2-3 Monitor platelets by anticoagulation protocol: Yes   Plan:  1) Repeat coumadin 1.5mg  x 1 2) INR in AM  Deboraha Sprang 06/14/2013,7:15 AM

## 2013-06-14 NOTE — Progress Notes (Signed)
I have reviewed this case with our NP and agree with the Assessment and Plan as stated.  Kyshawn Teal L. Uriegas, MD MBA The Palliative Medicine Team at [facility] Team Phone: [phone] Pager: [phone]   

## 2013-06-14 NOTE — Progress Notes (Signed)
SUBJECTIVE:  Denies dyspnea this AM  OBJECTIVE:   Vitals:   Filed Vitals:   06/13/13 2104 06/14/13 0600 06/14/13 0730 06/14/13 0830  BP: 99/60 102/61  96/72  Pulse: 80 80  83  Temp: 97.9 F (36.6 C) 97.5 F (36.4 C)    TempSrc: Oral Oral    Resp: 20 17    Height:      Weight:  163 lb 12.8 oz (74.299 kg)    SpO2: 100% 100% 98%    I&O's:    Intake/Output Summary (Last 24 hours) at 06/14/13 1018 Last data filed at 06/14/13 0800  Gross per 24 hour  Intake    336 ml  Output   1475 ml  Net  -1139 ml    PHYSICAL EXAM General: Well developed, well nourished, in no acute distress Head:    Normal  Lungs:   Mildly diminished BS RLL Heart:   HRRR Abdomen: obese, nontender Extremities:  No edema.   Neuro: Alert and oriented X 3. Psych:  Normal affect, responds appropriately   LABS: Basic Metabolic Panel:  Recent Labs  06/13/13 0303 06/14/13 0400  NA 136* 138  K 4.2 4.7  CL 97 96  CO2 24 28  GLUCOSE 92 102*  BUN 68* 71*  CREATININE 1.90* 1.84*  CALCIUM 8.6 8.5   CBC:  Recent Labs  06/13/13 0303 06/14/13 0400  WBC 12.9* 13.8*  HGB 10.5* 10.2*  HCT 32.4* 31.7*  MCV 93.6 93.2  PLT 241 230   Coag Panel:   Lab Results  Component Value Date   INR 2.72* 06/14/2013   INR 2.73* 06/13/2013   INR 2.39* 06/12/2013    RADIOLOGY: Dg Chest 2 View  06/06/2013   CLINICAL DATA:  Atrial fibrillation. Shortness of breath. Cough and congestion.  EXAM: CHEST  2 VIEW  COMPARISON:  Chest x-ray 05/16/2012.  FINDINGS: Lung volumes are normal. No consolidative airspace disease. No pleural effusions. No evidence of pulmonary edema. Mild pulmonary venous congestion. Mild cardiomegaly. Atherosclerosis in the thoracic aorta. Left-sided biventricular pacemaker/AICD with lead tips projecting over the expected location of the right atrium, overlying the lateral wall of the left ventricle via the coronary sinus and coronary veins, and in the right ventricular apex. Small calcified nodule in the  lateral aspect of the right mid lung, similar to numerous prior examinations, most compatible with a calcified granuloma. Multiple old vertebral body compression fractures throughout the mid to lower thoracic spine, unchanged compared to prior study 06/10/2010.  IMPRESSION: 1. Mild cardiomegaly with mild pulmonary venous congestion, but no frank pulmonary edema. 2. Atherosclerosis.   Electronically Signed   By: Vinnie Langton M.D.   On: 06/06/2013 00:00      ASSESSMENT/PLAN:   AFib/flutter: s/p TEE cardioversion. Continue warfarin indefinitely for stroke risk reduction.  CKD: Hold lasix and and resume in AM; follow renal function.  Weakness: plan for PT consult.  Systolic dysfunction: Continue Toprol XL 50 and ARB. Potential DC Monday.  Kirk Ruths, MD  06/14/2013  10:18 AM

## 2013-06-14 NOTE — Progress Notes (Signed)
Pt and family requested a foley catheter be placed d/t pt discomfort and shortness of breath when getting to the bedside commode. Pt and family educated about the risk for UTIs with the catheter.  MD on call notified about pt request and pt comfort care goals for care and indwelling urinary catheter ordered.  14 Fr placed using sterile technique. Urine returned; clear yellow. Pt tolerated well. Secured with stat-lock to right thigh.  Will continue to monitor and educate both pt and family in care and maintenance of catheter.   Pt reports not having bowel movement since arrival to hospital.  PRN medication offered and refused by pt.

## 2013-06-15 LAB — BASIC METABOLIC PANEL
BUN: 69 mg/dL — ABNORMAL HIGH (ref 6–23)
CALCIUM: 8.4 mg/dL (ref 8.4–10.5)
CO2: 26 mEq/L (ref 19–32)
CREATININE: 1.66 mg/dL — AB (ref 0.50–1.10)
Chloride: 94 mEq/L — ABNORMAL LOW (ref 96–112)
GFR calc non Af Amer: 27 mL/min — ABNORMAL LOW (ref >90)
GFR, EST AFRICAN AMERICAN: 32 mL/min — AB (ref >90)
Glucose, Bld: 96 mg/dL (ref 70–99)
Potassium: 4.4 mEq/L (ref 3.7–5.3)
Sodium: 135 mEq/L — ABNORMAL LOW (ref 137–147)

## 2013-06-15 LAB — CBC
HEMATOCRIT: 32.3 % — AB (ref 36.0–46.0)
Hemoglobin: 10.5 g/dL — ABNORMAL LOW (ref 12.0–15.0)
MCH: 30.1 pg (ref 26.0–34.0)
MCHC: 32.5 g/dL (ref 30.0–36.0)
MCV: 92.6 fL (ref 78.0–100.0)
Platelets: 229 10*3/uL (ref 150–400)
RBC: 3.49 MIL/uL — ABNORMAL LOW (ref 3.87–5.11)
RDW: 14.9 % (ref 11.5–15.5)
WBC: 10.5 10*3/uL (ref 4.0–10.5)

## 2013-06-15 LAB — PROTIME-INR
INR: 2.18 — ABNORMAL HIGH (ref 0.00–1.49)
Prothrombin Time: 23.6 seconds — ABNORMAL HIGH (ref 11.6–15.2)

## 2013-06-15 MED ORDER — WARFARIN SODIUM 3 MG PO TABS
3.0000 mg | ORAL_TABLET | Freq: Once | ORAL | Status: AC
  Administered 2013-06-15: 3 mg via ORAL
  Filled 2013-06-15: qty 1

## 2013-06-15 MED ORDER — FUROSEMIDE 40 MG PO TABS
40.0000 mg | ORAL_TABLET | Freq: Every day | ORAL | Status: DC
  Administered 2013-06-15 – 2013-06-16 (×2): 40 mg via ORAL
  Filled 2013-06-15 (×2): qty 1

## 2013-06-15 NOTE — Progress Notes (Signed)
Patient's manual BP 90/62, asymptomatic.  MD notified.  Will continue to monitor. [place], [name]

## 2013-06-15 NOTE — Progress Notes (Signed)
SUBJECTIVE:  Denies dyspnea this AM; no chest pain  OBJECTIVE:   Vitals:   Filed Vitals:   06/14/13 1358 06/14/13 2040 06/15/13 0500 06/15/13 0740  BP: 115/81 96/60 101/64   Pulse: 80 72 80   Temp: 97.7 F (36.5 C) 98.2 F (36.8 C) 97.7 F (36.5 C)   TempSrc: Oral Oral Oral   Resp: 18 18 18    Height:      Weight:   162 lb 9.6 oz (73.755 kg)   SpO2: 100% 100% 100% 100%   I&O's:    Intake/Output Summary (Last 24 hours) at 06/15/13 0919 Last data filed at 06/15/13 3500  Gross per 24 hour  Intake    250 ml  Output    400 ml  Net   -150 ml    PHYSICAL EXAM General: Well developed, well nourished, in no acute distress Head:    Normal  Lungs:   Mildly diminished BS RLL Heart:   HRRR Abdomen: obese, nontender Extremities:  No edema.   Neuro: Alert and oriented X 3. Psych:  Normal affect, responds appropriately   LABS: Basic Metabolic Panel:  Recent Labs  06/14/13 0400 06/15/13 0410  NA 138 135*  K 4.7 4.4  CL 96 94*  CO2 28 26  GLUCOSE 102* 96  BUN 71* 69*  CREATININE 1.84* 1.66*  CALCIUM 8.5 8.4   CBC:  Recent Labs  06/14/13 0400 06/15/13 0410  WBC 13.8* 10.5  HGB 10.2* 10.5*  HCT 31.7* 32.3*  MCV 93.2 92.6  PLT 230 229   Coag Panel:   Lab Results  Component Value Date   INR 2.18* 06/15/2013   INR 2.72* 06/14/2013   INR 2.73* 06/13/2013    RADIOLOGY: Dg Chest 2 View  06/06/2013   CLINICAL DATA:  Atrial fibrillation. Shortness of breath. Cough and congestion.  EXAM: CHEST  2 VIEW  COMPARISON:  Chest x-ray 05/16/2012.  FINDINGS: Lung volumes are normal. No consolidative airspace disease. No pleural effusions. No evidence of pulmonary edema. Mild pulmonary venous congestion. Mild cardiomegaly. Atherosclerosis in the thoracic aorta. Left-sided biventricular pacemaker/AICD with lead tips projecting over the expected location of the right atrium, overlying the lateral wall of the left ventricle via the coronary sinus and coronary veins, and in the right  ventricular apex. Small calcified nodule in the lateral aspect of the right mid lung, similar to numerous prior examinations, most compatible with a calcified granuloma. Multiple old vertebral body compression fractures throughout the mid to lower thoracic spine, unchanged compared to prior study 06/10/2010.  IMPRESSION: 1. Mild cardiomegaly with mild pulmonary venous congestion, but no frank pulmonary edema. 2. Atherosclerosis.   Electronically Signed   By: Vinnie Langton M.D.   On: 06/06/2013 00:00      ASSESSMENT/PLAN:   AFib/flutter: s/p TEE cardioversion. Continue warfarin indefinitely for stroke risk reduction. Continue amiodarone load.  CKD: Resume lasix 40 mg daily; follow renal function.  Weakness: plan for PT consult.  Systolic dysfunction: Continue Toprol XL 50 and ARB.  Potential DC with hospice Monday.  Kirk Ruths, MD  06/15/2013  9:19 AM

## 2013-06-15 NOTE — Progress Notes (Signed)
ANTICOAGULATION CONSULT NOTE - Follow Up Consult  Pharmacy Consult for Coumadin Indication: atrial fibrillation  Allergies  Allergen Reactions  . Iodinated Diagnostic Agents     Unknown    Patient Measurements: Height: 5\' 2"  (157.5 cm) Weight: 162 lb 9.6 oz (73.755 kg) IBW/kg (Calculated) : 50.1  Vital Signs: Temp: 97.7 F (36.5 C) (04/05 0500) Temp src: Oral (04/05 0500) BP: 101/64 mmHg (04/05 0500) Pulse Rate: 80 (04/05 0500)  Labs:  Recent Labs  06/13/13 0303 06/14/13 0400 06/15/13 0410  HGB 10.5* 10.2* 10.5*  HCT 32.4* 31.7* 32.3*  PLT 241 230 229  LABPROT 28.0* 27.9* 23.6*  INR 2.73* 2.72* 2.18*  CREATININE 1.90* 1.84* 1.66*    Estimated Creatinine Clearance: 23.7 ml/min (by C-G formula based on Cr of 1.66).  Assessment: 86yof continues on coumadin for afib. INR is therapeutic. Will need to watch for increase sensitivity with addition of amiodarone (started 4/2), however, INR actually trending down with lower doses. CBC is stable. No bleeding reported.  Home dose: coumadin 3mg  daily - dose at discharge likely to decrease with new amiodarone  Goal of Therapy:  INR 2-3 Monitor platelets by anticoagulation protocol: Yes   Plan:  1) Increase coumadin to 3mg  x 1 2) INR in AM  Deboraha Sprang 06/15/2013,7:22 AM

## 2013-06-15 NOTE — Care Management Note (Signed)
06/15/13 16:50  CM received notice of of denial of continued Medicare coverage beginning at 12:00pm (noon) on 06/16/13. Liason states she has made daughter of patient aware.  CM Asst Director made aware.  No other needs were communicated.  Mariane Masters, BSN, CM [phone].

## 2013-06-16 ENCOUNTER — Telehealth: Payer: Self-pay | Admitting: *Deleted

## 2013-06-16 DIAGNOSIS — R05 Cough: Secondary | ICD-10-CM

## 2013-06-16 DIAGNOSIS — R059 Cough, unspecified: Secondary | ICD-10-CM

## 2013-06-16 LAB — BASIC METABOLIC PANEL
BUN: 69 mg/dL — ABNORMAL HIGH (ref 6–23)
CHLORIDE: 97 meq/L (ref 96–112)
CO2: 27 mEq/L (ref 19–32)
Calcium: 8.6 mg/dL (ref 8.4–10.5)
Creatinine, Ser: 1.75 mg/dL — ABNORMAL HIGH (ref 0.50–1.10)
GFR, EST AFRICAN AMERICAN: 30 mL/min — AB (ref >90)
GFR, EST NON AFRICAN AMERICAN: 26 mL/min — AB (ref >90)
Glucose, Bld: 90 mg/dL (ref 70–99)
Potassium: 5.2 mEq/L (ref 3.7–5.3)
SODIUM: 136 meq/L — AB (ref 137–147)

## 2013-06-16 LAB — CBC
HCT: 32 % — ABNORMAL LOW (ref 36.0–46.0)
HEMOGLOBIN: 10.3 g/dL — AB (ref 12.0–15.0)
MCH: 29.9 pg (ref 26.0–34.0)
MCHC: 32.2 g/dL (ref 30.0–36.0)
MCV: 93 fL (ref 78.0–100.0)
Platelets: 230 10*3/uL (ref 150–400)
RBC: 3.44 MIL/uL — ABNORMAL LOW (ref 3.87–5.11)
RDW: 15.1 % (ref 11.5–15.5)
WBC: 10 10*3/uL (ref 4.0–10.5)

## 2013-06-16 LAB — PROTIME-INR
INR: 2.44 — ABNORMAL HIGH (ref 0.00–1.49)
PROTHROMBIN TIME: 25.7 s — AB (ref 11.6–15.2)

## 2013-06-16 MED ORDER — METOPROLOL SUCCINATE ER 25 MG PO TB24: 25.0000 mg | ORAL_TABLET | Freq: Every day | ORAL | Status: AC

## 2013-06-16 MED ORDER — FUROSEMIDE 40 MG PO TABS: 40.0000 mg | ORAL_TABLET | Freq: Every day | ORAL | Status: DC

## 2013-06-16 MED ORDER — MORPHINE SULFATE (CONCENTRATE) 10 MG /0.5 ML PO SOLN: 5.0000 mg | ORAL | Status: AC | PRN

## 2013-06-16 MED ORDER — AMIODARONE HCL 200 MG PO TABS
400.0000 mg | ORAL_TABLET | Freq: Two times a day (BID) | ORAL | Status: AC
Start: 2013-06-16 — End: ?

## 2013-06-16 MED ORDER — DEXTROMETHORPHAN POLISTIREX 30 MG/5ML PO LQCR: 15.0000 mg | Freq: Two times a day (BID) | ORAL | Status: AC

## 2013-06-16 MED ORDER — BENZONATATE 200 MG PO CAPS: 200.0000 mg | ORAL_CAPSULE | Freq: Three times a day (TID) | ORAL | Status: AC | PRN

## 2013-06-16 MED ORDER — LIDOCAINE 5 % EX PTCH: 2.0000 | MEDICATED_PATCH | CUTANEOUS | Status: AC

## 2013-06-16 MED ORDER — BISACODYL 10 MG RE SUPP: 10.0000 mg | Freq: Every day | RECTAL | Status: AC | PRN

## 2013-06-16 MED ORDER — FUROSEMIDE 40 MG PO TABS: 40.0000 mg | ORAL_TABLET | Freq: Every day | ORAL | Status: AC

## 2013-06-16 MED ORDER — METOPROLOL SUCCINATE ER 25 MG PO TB24: 25.0000 mg | ORAL_TABLET | Freq: Every day | ORAL | Status: DC

## 2013-06-16 MED ORDER — WARFARIN SODIUM 3 MG PO TABS: 3.0000 mg | ORAL_TABLET | Freq: Every day | ORAL | Status: AC

## 2013-06-16 MED ORDER — IRBESARTAN 75 MG PO TABS: 34.5000 mg | ORAL_TABLET | Freq: Every day | ORAL | Status: AC

## 2013-06-16 MED ORDER — DIAZEPAM 2 MG PO TABS: 2.0000 mg | ORAL_TABLET | Freq: Two times a day (BID) | ORAL | Status: AC | PRN

## 2013-06-16 MED ORDER — WARFARIN SODIUM 2.5 MG PO TABS
2.5000 mg | ORAL_TABLET | Freq: Once | ORAL | Status: DC
  Filled 2013-06-16: qty 1

## 2013-06-16 MED ORDER — ROPINIROLE HCL 1 MG PO TABS: 1.0000 mg | ORAL_TABLET | Freq: Every day | ORAL | Status: AC

## 2013-06-16 MED ORDER — BISACODYL 10 MG RE SUPP: 10.0000 mg | Freq: Every day | RECTAL | Status: DC | PRN

## 2013-06-16 MED ORDER — AMIODARONE HCL 200 MG PO TABS: 400.0000 mg | ORAL_TABLET | Freq: Two times a day (BID) | ORAL | Status: DC

## 2013-06-16 MED ORDER — LIDOCAINE 5 % EX PTCH: 2.0000 | MEDICATED_PATCH | CUTANEOUS | Status: DC

## 2013-06-16 MED ORDER — POTASSIUM CHLORIDE CRYS ER 20 MEQ PO TBCR: 20.0000 meq | EXTENDED_RELEASE_TABLET | Freq: Every day | ORAL | Status: AC

## 2013-06-16 NOTE — Progress Notes (Signed)
I have reviewed this case with our NP and agree with the Assessment and Plan as stated.  Tamme Mozingo L. Kasson, MD MBA The Palliative Medicine Team at [place] Team Phone: [phone] Pager: [phone]   

## 2013-06-16 NOTE — Progress Notes (Signed)
Progress Note from the Palliative Medicine Team at [facility]  Subjective: [name] is resting this morning and is coughing a little and is fatigued this morning. She reports that she slept well last night and that she is going home today (she is happy to report). [name] is speaking with hospice nurses now and tells me after that she feels better about discharge today. [name] says she has spoken with [name] about an assessment to supplement hospice care at home as needed. They have no further questions/concerns at this time. I did remind them I am available if needed that nursing can page me. It has been a pleasure supporting [name] and her family.     Objective: Allergies  Allergen Reactions  . Iodinated Diagnostic Agents     Unknown   Scheduled Meds: . amiodarone  400 mg Oral BID  . budesonide-formoterol  2 puff Inhalation Daily  . citalopram  10 mg Oral Daily  . dextromethorphan  15 mg Oral BID  . diclofenac sodium  4 g Topical QID  . furosemide  40 mg Oral Daily  . irbesartan  37.5 mg Oral Daily  . ketotifen  2 drop Both Eyes BID  . lidocaine  2 patch Transdermal Q24H  . loratadine  10 mg Oral Daily  . metoprolol succinate  25 mg Oral Daily  . omega-3 acid ethyl esters  1 g Oral Daily  . pantoprazole  40 mg Oral Daily  . potassium chloride  20 mEq Oral Daily  . rOPINIRole  1 mg Oral Daily  . sodium chloride  3 mL Intravenous Q12H  . sodium chloride  3 mL Intravenous Q12H  . tiotropium  18 mcg Inhalation Daily  . Vitamin D (Ergocalciferol)  50,000 Units Oral Q Tue  . Warfarin - Pharmacist Dosing Inpatient   Does not apply q1800   Continuous Infusions: . sodium chloride     PRN Meds:.sodium chloride, acetaminophen, benzonatate, diazepam, morphine CONCENTRATE, sodium chloride, sodium chloride, sorbitol  BP 97/57  Pulse 81  Temp(Src) 97.7 F (36.5 C) (Oral)  Resp 16  Ht 5\' 2"  (1.575 m)  Wt 73.755 kg (162 lb 9.6 oz)  BMI 29.73 kg/m2  SpO2 100%  LMP 01/10/2012    PPS: 30%     Intake/Output Summary (Last 24 hours) at 06/16/13 0904 Last data filed at 06/16/13 0646  Gross per 24 hour  Intake    120 ml  Output   1575 ml  Net  -1455 ml      LBM: 06/06/13      Physical Exam:  General: NAD, awake, alert  HEENT: [place]/AT, + JVD, moist mucous membranes  Chest: Expiratory wheezing, decreased bases Rt>Lt   CVS: RRR, S1 S2  Abdomen: Soft, NT, ND, +BS  Ext: MAE, no edema, warm to touch  Neuro: Alert, oriented x 3, follows commands  Labs: CBC    Component Value Date/Time   WBC 10.0 06/16/2013 0408   RBC 3.44* 06/16/2013 0408   HGB 10.3* 06/16/2013 0408   HCT 32.0* 06/16/2013 0408   PLT 230 06/16/2013 0408   MCV 93.0 06/16/2013 0408   MCH 29.9 06/16/2013 0408   MCHC 32.2 06/16/2013 0408   RDW 15.1 06/16/2013 0408   LYMPHSABS 1.2 05/26/2013 1056   MONOABS 0.7 05/26/2013 1056   EOSABS 0.1 05/26/2013 1056   BASOSABS 0.0 05/26/2013 1056    BMET    Component Value Date/Time   NA 136* 06/16/2013 0408   K 5.2 06/16/2013 0408   CL  97 06/16/2013 0408   CO2 27 06/16/2013 0408   GLUCOSE 90 06/16/2013 0408   BUN 69* 06/16/2013 0408   CREATININE 1.75* 06/16/2013 0408   CALCIUM 8.6 06/16/2013 0408   GFRNONAA 26* 06/16/2013 0408   GFRAA 30* 06/16/2013 0408    CMP     Component Value Date/Time   NA 136* 06/16/2013 0408   K 5.2 06/16/2013 0408   CL 97 06/16/2013 0408   CO2 27 06/16/2013 0408   GLUCOSE 90 06/16/2013 0408   BUN 69* 06/16/2013 0408   CREATININE 1.75* 06/16/2013 0408   CALCIUM 8.6 06/16/2013 0408   PROT 6.6 06/24/2012 1501   ALBUMIN 3.3* 06/24/2012 1501   AST 54* 06/24/2012 1501   ALT 44* 06/24/2012 1501   ALKPHOS 138* 06/24/2012 1501   BILITOT 0.5 06/24/2012 1501   GFRNONAA 26* 06/16/2013 0408   GFRAA 30* 06/16/2013 0408     Assessment and Plan:  1. Code Status: DNR 2. Symptom Control:  1. Anxiety/Agitation:  1. Valium 2 mg prn for anxiety, muscle spasms, or sleep. May also help with her pain around ribs r/t coughing episodes. Please write prescriptions for Valium, tessalon  pearls, and dextromethorphan for home use and adequate symptom management.  2. Pain:  1. Roxanol 5 mg prn for pain or dyspnea. **DISPENSE 30 ML FOR HOSPICE PRESCRIPTION. 2. Lidoderm patch to neck and lower back.  3. Bowel Regimen: Sorbitol prn. Dulcolax supp prn daily.  4. Fever: Acetaminophen prn.  3. Psycho/Social: Emotional support provided to patient and family.  4. Disposition: Home with hospice today.    Time In Time Out Total Time Spent with Patient Total Overall Time  0845 0910 53min 93min    Greater than 50%  of this time was spent counseling and coordinating care related to the above assessment and plan.  Vinie Sill, NP Palliative Medicine Team Pager # [phone] (M-F 8a-5p) Team Phone # [phone] (Nights/Weekends)

## 2013-06-16 NOTE — Discharge Instructions (Signed)
Atrial Fibrillation Atrial fibrillation is a type of irregular heart rhythm (arrhythmia). During atrial fibrillation, the upper chambers of the heart (atria) quiver continuously in a chaotic pattern. This causes an irregular and often rapid heart rate.  Atrial fibrillation is the result of the heart becoming overloaded with disorganized signals that tell it to beat. These signals are normally released one at a time by a part of the right atrium called the sinoatrial node. They then travel from the atria to the lower chambers of the heart (ventricles), causing the atria and ventricles to contract and pump blood as they pass. In atrial fibrillation, parts of the atria outside of the sinoatrial node also release these signals. This results in two problems. First, the atria receive so many signals that they do not have time to fully contract. Second, the ventricles, which can only receive one signal at a time, beat irregularly and out of rhythm with the atria.  There are three types of atrial fibrillation:   Paroxysmal Paroxysmal atrial fibrillation starts suddenly and stops on its own within a week.   Persistent Persistent atrial fibrillation lasts for more than a week. It may stop on its own or with treatment.   Permanent Permanent atrial fibrillation does not go away. Episodes of atrial fibrillation may lead to permanent atrial fibrillation.  Atrial fibrillation can prevent your heart from pumping blood normally. It increases your risk of stroke and can lead to heart failure.  CAUSES   Heart conditions, including a heart attack, heart failure, coronary artery disease, and heart valve conditions.   Inflammation of the sac that surrounds the heart (pericarditis).   Blockage of an artery in the lungs (pulmonary embolism).   Pneumonia or other infections.   Chronic lung disease.   Thyroid problems, especially if the thyroid is overactive (hyperthyroidism).   Caffeine, excessive alcohol  use, and use of some illegal drugs.   Use of some medications, including certain decongestants and diet pills.   Heart surgery.   Birth defects.  Sometimes, no cause can be found. When this happens, the atrial fibrillation is called lone atrial fibrillation. The risk of complications from atrial fibrillation increases if you have lone atrial fibrillation and you are age 26 years or older. RISK FACTORS  Heart failure.  Coronary artery disease  Diabetes mellitus.   High blood pressure (hypertension).   Obesity.   Other arrhythmias.   Increased age. SYMPTOMS   A feeling that your heart is beating rapidly or irregularly.   A feeling of discomfort or pain in your chest.   Shortness of breath.   Sudden lightheadedness or weakness.   Getting tired easily when exercising.   Urinating more often than normal (mainly when atrial fibrillation first begins).  In paroxysmal atrial fibrillation, symptoms may start and suddenly stop. DIAGNOSIS  Your caregiver may be able to detect atrial fibrillation when taking your pulse. Usually, testing is needed to diagnosis atrial fibrillation. Tests may include:   Electrocardiography. During this test, the electrical impulses of your heart are recorded while you are lying down.   Echocardiography. During echocardiography, sound waves are used to evaluate how blood flows through your heart.   Stress test. There is more than one type of stress test. If a stress test is needed, ask your caregiver about which type is best for you.   Chest X-ray exam.   Blood tests.   Computed tomography (CT).  TREATMENT   Treating any underlying conditions. For example, if you have an overactive  thyroid, treating the condition may correct atrial fibrillation.   Medication. Medications may be given to control a rapid heart rate or to prevent blood clots, heart failure, or a stroke.   Procedure to correct the rhythm of the  heart:  Electrical cardioversion. During electrical cardioversion, a controlled, low-energy shock is delivered to the heart through your skin. If you have chest pain, very low pressure blood pressure, or sudden heart failure, this procedure may need to be done as an emergency.  Catheter ablation. During this procedure, heart tissues that send the signals that cause atrial fibrillation are destroyed.  Maze or minimaze procedure. During this surgery, thin lines of heart tissue that carry the abnormal signals are destroyed. The maze procedure is an open-heart surgery. The minimaze procedure is a minimally invasive surgery. This means that small cuts are made to access the heart instead of a large opening.  Pulmonary venous isolation. During this surgery, tissue around the veins that carry blood from the lungs (pulmonary veins) is destroyed. This tissue is thought to carry the abnormal signals. HOME CARE INSTRUCTIONS   Take medications as directed by your caregiver.  Only take medications that your caregiver approves. Some medications can make atrial fibrillation worse or recur.  If blood thinners were prescribed by your caregiver, take them exactly as directed. Too much can cause bleeding. Too little and you will not have the needed protection against stroke and other problems.  Perform blood tests at home if directed by your caregiver.  Perform blood tests exactly as directed.   Quit smoking if you smoke.   Do not drink alcohol.   Do not drink caffeinated beverages such as coffee, soda, and some teas. You may drink decaffeinated coffee, soda, or tea.   Maintain a healthy weight. Do not use diet pills unless your caregiver approves. They may make heart problems worse.   Follow diet instructions as directed by your caregiver.   Exercise regularly as directed by your caregiver.   Keep all follow-up appointments. PREVENTION  The following substances can cause atrial fibrillation  to recur:   Caffeinated beverages.   Alcohol.   Certain medications, especially those used for breathing problems.   Certain herbs and herbal medications, such as those containing ephedra or ginseng.  Illegal drugs such as cocaine and amphetamines. Sometimes medications are given to prevent atrial fibrillation from recurring. Proper treatment of any underlying condition is also important in helping prevent recurrence.  SEEK MEDICAL CARE IF:  You notice a change in the rate, rhythm, or strength of your heartbeat.   You suddenly begin urinating more frequently.   You tire more easily when exerting yourself or exercising.  SEEK IMMEDIATE MEDICAL CARE IF:   You develop chest pain, abdominal pain, sweating, or weakness.  You feel sick to your stomach (nauseous).  You develop shortness of breath.  You suddenly develop swollen feet and ankles.  You feel dizzy.  You face or limbs feel numb or weak.  There is a change in your vision or speech. MAKE SURE YOU:   Understand these instructions.  Will watch your condition.  Will get help right away if you are not doing well or get worse. Document Released: 02/27/2005 Document Revised: 06/24/2012 Document Reviewed: 04/09/2012 [facility] Patient Information 2014 [place].  Sodium and Fluid Restriction Some health conditions may require you to restrict your sodium and fluid intake. Sodium is part of the salt in the blood. Sodium may be restricted because when you take in a lot  of salt, you become thirsty. Limiting salt with help you become less thirsty and may make it easier to restrict fluid. Talk to your caregiver or dietician about how many cups of fluid and how many milligrams of sodium you are allowed each day. If your caregiver has restricted your sodium and fluids, usually the amount you can drink depends on several things, such as:  Your urine output.  How much fluid you are retaining.  Your blood pressure. Every  2 cups (500 mL) of fluid retained in the body becomes an extra 1 pound (0.5 kg) of body weight. The following are examples of some fluids you will have to restrict:  Tea, coffee, soda, lemonade, milk, water, and juice.  Alcoholic beverages.  Cream.  Gravy.  Ice cubes.  Soup and broth. The following are foods that become liquid at room temperature. These foods will count towards your fluid intake.  Ice cream and ice milk.  Frozen yogurt and sherbet.  Frozen ice pops.  Flavored gelatin. YOU MAY BE TAKING IN TOO MUCH FLUID IF:  Your weight increases.  Your face, hands, legs, feet, and abdomen start to swell.  You have trouble breathing. HOME CARE INSTRUCTIONS If you follow a low sodium diet closely, you will eat approximately 1,500 mg of sodium a day.   Avoid salty foods. This increases your thirst and makes fluid control more difficult. Foods high in sodium include:  Most canned foods, including most meats.  Most processed foods, including most meats.  Cheese.  Dried pasta and rice mixes.  Snack foods (chips, popcorn, pretzels, cheese puffs, salted nuts).  Dips, sauces, and salad dressings.  Do not use salt in cooking or add salt to your meal. Lacinda Axon with herbs and spices, but not those that have salt in the name. Ask your caregiver if it is okay to use salt substitutes.  Eat home-prepared meals. Use fresh ingredients. Avoid canned, frozen, or packaged meals.  Read food labels to see how much sodium is in the food. Know how much sodium you are allowed each day.  When eating out, ask for dressings and sauces on the side.  Weigh yourself every morning with an empty bladder before you eat or drink. If your weight is going up, you are retaining fluid.  Freeze fruit juice or water in an ice cube tray. Use this as part of your fluid allowance.  Brush your teeth often or rinse your mouth with mouthwash to help your dry mouth. Lemon wedges, hard sour candies, chewing  gum, or breath spray may help to moisten your mouth too.  Add a slice of fresh lemon or lemon juice to water or ice. This helps satisfy your thirst.  Try frozen fruits between meals, such as grapes or strawberries.  Swallow your pills along with meals or soft foods. This helps you save your fluid for something you enjoy.  Use small cups and glasses and learn to sip fluids slowly.  Keep your home cooler. Keep the air in your home as humid as possible. Dry air increases thirst.  Avoid being out in the hot sun. Each morning, fill a jug with the amount of water you are allowed for the day. You can use this water as a guideline for fluid allowance. Each time you take in fluid, pour an equal amount of water out of the container. This helps you to see how much fluid you are taking in. It also helps plan your fluid intake for the rest of the day.  CONVERSIONS TO HELP MEASURE FLUID INTAKE  1 cup equals 8 oz (240 mL).   cup equals 6 oz (180 mL).   cup equals 5  oz (160 mL).   cup equals 4 oz (120 mL).   cup equals 2  oz (80 mL).   cup equals 2 oz (60 mL).  2 tbs equals 1 oz (30 mL). Document Released: 12/25/2006 Document Revised: 08/29/2011 Document Reviewed: 08/11/2011 [employer] Patient Information 2014 [place], [place].  2 Gram Low Sodium Diet A 2 gram sodium diet restricts the amount of sodium in the diet to no more than 2 g or 2000 mg daily. Limiting the amount of sodium is often used to help lower blood pressure. It is important if you have heart, liver, or kidney problems. Many foods contain sodium for flavor and sometimes as a preservative. When the amount of sodium in a diet needs to be low, it is important to know what to look for when choosing foods and drinks. The following includes some information and guidelines to help make it easier for you to adapt to a low sodium diet. QUICK TIPS  Do not add salt to food.  Avoid convenience items and fast food.  Choose unsalted snack  foods.  Buy lower sodium products, often labeled as "lower sodium" or "no salt added."  Check food labels to learn how much sodium is in 1 serving.  When eating at a restaurant, ask that your food be prepared with less salt or none, if possible. READING FOOD LABELS FOR SODIUM INFORMATION The nutrition facts label is a good place to find how much sodium is in foods. Look for products with no more than 500 to 600 mg of sodium per meal and no more than 150 mg per serving. Remember that 2 g = 2000 mg. The food label may also list foods as:  Sodium-free: Less than 5 mg in a serving.  Very low sodium: 35 mg or less in a serving.  Low-sodium: 140 mg or less in a serving.  Light in sodium: 50% less sodium in a serving. For example, if a food that usually has 300 mg of sodium is changed to become light in sodium, it will have 150 mg of sodium.  Reduced sodium: 25% less sodium in a serving. For example, if a food that usually has 400 mg of sodium is changed to reduced sodium, it will have 300 mg of sodium. CHOOSING FOODS Grains  Avoid: Salted crackers and snack items. Some cereals, including instant hot cereals. Bread stuffing and biscuit mixes. Seasoned rice or pasta mixes.  Choose: Unsalted snack items. Low-sodium cereals, oats, puffed wheat and rice, shredded wheat. English muffins and bread. Pasta. Meats  Avoid: Salted, canned, smoked, spiced, pickled meats, including fish and poultry. Bacon, ham, sausage, cold cuts, hot dogs, anchovies.  Choose: Low-sodium canned tuna and salmon. Fresh or frozen meat, poultry, and fish. Dairy  Avoid: Processed cheese and spreads. Cottage cheese. Buttermilk and condensed milk. Regular cheese.  Choose: Milk. Low-sodium cottage cheese. Yogurt. Sour cream. Low-sodium cheese. Fruits and Vegetables  Avoid: Regular canned vegetables. Regular canned tomato sauce and paste. Frozen vegetables in sauces. Olives. Angie Fava. Relishes. Sauerkraut.  Choose:  Low-sodium canned vegetables. Low-sodium tomato sauce and paste. Frozen or fresh vegetables. Fresh and frozen fruit. Condiments  Avoid: Canned and packaged gravies. Worcestershire sauce. Tartar sauce. Barbecue sauce. Soy sauce. Steak sauce. Ketchup. Onion, garlic, and table salt. Meat flavorings and tenderizers.  Choose: Fresh and dried herbs and spices. Low-sodium varieties  of mustard and ketchup. Lemon juice. Tabasco sauce. Horseradish. SAMPLE 2 GRAM SODIUM MEAL PLAN Breakfast / Sodium (mg)  1 cup low-fat milk / [id number] mg  2 slices whole-wheat toast / 270 mg  1 tbs heart-healthy margarine / 153 mg  1 hard-boiled egg / 139 mg  1 small orange / 0 mg Lunch / Sodium (mg)  1 cup raw carrots / 76 mg   cup hummus / 298 mg  1 cup low-fat milk / 143 mg   cup red grapes / 2 mg  1 whole-wheat pita bread / 356 mg Dinner / Sodium (mg)  1 cup whole-wheat pasta / 2 mg  1 cup low-sodium tomato sauce / 73 mg  3 oz lean ground beef / 57 mg  1 small side salad (1 cup raw spinach leaves,  cup cucumber,  cup yellow bell pepper) with 1 tsp olive oil and 1 tsp red wine vinegar / 25 mg Snack / Sodium (mg)  1 container low-fat vanilla yogurt / 107 mg  3 graham cracker squares / 127 mg Nutrient Analysis  Calories: 2033  Protein: 77 g  Carbohydrate: 282 g  Fat: 72 g  Sodium: 1971 mg Document Released: 02/27/2005 Document Revised: 05/22/2011 Document Reviewed: 05/31/2009 [employer] Patient Information 2014 [place].

## 2013-06-16 NOTE — Progress Notes (Signed)
ANTICOAGULATION CONSULT NOTE - Follow Up Consult  Pharmacy Consult for Coumadin Indication: atrial fibrillation  Allergies  Allergen Reactions  . Iodinated Diagnostic Agents     Unknown    Patient Measurements: Height: 5\' 2"  (157.5 cm) Weight: 162 lb 9.6 oz (73.755 kg) IBW/kg (Calculated) : 50.1 Heparin Dosing Weight:   Vital Signs: Temp: 97.7 F (36.5 C) (04/06 0646) Temp src: Oral (04/06 0646) BP: 97/57 mmHg (04/06 0646) Pulse Rate: 81 (04/06 0646)  Labs:  Recent Labs  06/14/13 0400 06/15/13 0410 06/16/13 0408  HGB 10.2* 10.5* 10.3*  HCT 31.7* 32.3* 32.0*  PLT 230 229 230  LABPROT 27.9* 23.6* 25.7*  INR 2.72* 2.18* 2.44*  CREATININE 1.84* 1.66* 1.75*    Estimated Creatinine Clearance: 22.5 ml/min (by C-G formula based on Cr of 1.75).  Assessment: 86yof continuing on Coumadin for hx Afib. INR (2.44) remains therapeutic. Noted plans for discharge today. - H/H and Plts stable - No significant bleeding reported  Goal of Therapy:  INR 2-3   Plan:  1. Coumadin 2.5mg  po x 1 today if still here 2. Recommend to monitor INR closely on discharge (and may need an eventual dose decrease) with amiodarone addition 3. Daily INR  Earleen Newport [phone] 06/16/2013,10:36 AM

## 2013-06-16 NOTE — Telephone Encounter (Signed)
PA to [employer] for this Hospice patient, for LIDOCAINE 5% PATCH for neck and back ache

## 2013-06-16 NOTE — Progress Notes (Signed)
Notified patient and family request services of [facility] after discharge.   Spoke with pt's dtr [name] away from bedside and later with pt and daughter at bedside.  Initiated education related to hospice services, philosophy and team approach to care - both voiced good understanding of information provided.  Per notes and discussion plan is to d/c home by non-emergent transport today, Monday 06/16/13  *Please send GOLD DNR form home with pt *Writer discussed with Jess staff RN and Suanne Marker PA needed discharge prescriptions    DME has been delivered and set up in the home per daughter [name] - fully electric hospital bed, overbed table and Oxygen concentrator -pt currently on [facility]   Initial paperwork faxed to [facility]   Completed d/c summary will need to be faxed to [place] @ [phone] when final Please notify [facility] when patient is ready to leave unit at d/c call [phone] (or [phone] if after 5 pm);  [facility] information and contact numbers also given to dtr [name] during visit.   Above information shared with [facility] Hassan Rowan Please call with any questions or concerns   Danton Sewer, RN 06/16/2013, 9:31 AM [facility] Liaison [phone]

## 2013-06-16 NOTE — Progress Notes (Signed)
  Recent Labs  06/05/13 2220  PROBNP 13216.0*    ASSESSMENT AND PLAN:  Principal Problem:   Acute on chronic systolic heart failure Active Problems:   Atrial fibrillation   Acute on chronic renal failure   Cardiomyopathy, ischemic   Weakness generalized   Palliative care encounter qwill try abnd continue BB and ARB for now, although with low blood pressure may niot be able To     Signed, Virl Axe MD  06/16/2013

## 2013-06-24 ENCOUNTER — Encounter: Payer: Medicare Other | Admitting: Internal Medicine

## 2013-06-24 NOTE — Discharge Summary (Signed)
CARDIOLOGY DISCHARGE SUMMARY      Patient ID: [name] MRN: [id number] DOB/AGE: Apr 22, 1926 80 y.o.   Admit date: 06/05/2013 Discharge date: 06/16/2013   PCP: Donnajean Lopes, MD Primary Cardiologist:  Dr. Caryl Comes   Primary Discharge Diagnosis:  Acute on chronic systolic heart failure Secondary Discharge Diagnosis:     Weakness generalized   Acute on chronic renal failure   Cardiomyopathy, ischemic - [employer] BiV pacemaker, DDDR mode base rate 80   Atrial fibrillation   Palliative care encounter   DO NOT RESUSCITATE   Chronic anticoagulation w/ coumadin   Consults: palliative care    Procedures: pacemaker interrogation and changed settings   Hospital Course: [name] is a 80 y.o. female with a history of CAD. She came to the emergency room on 3/27 with increasing shortness of breath and atrial fibrillation. She was admitted for recurrent atrial fibrillation and CHF.   She was felt not to be tolerating the atrial fibrillation very well. A TEE cardioversion was scheduled. The TEE was performed, results are below. She received sedation by anesthesia but became significantly hypotensive and hypoxic. She was given Neo-Synephrine. She had no LAA thrombus so after her blood pressure improved, she  was able to be cardioverted. It resulted in sinus rhythm. She had been taken off amiodarone in the past because of elevated transaminases. However, Dr. Caryl Comes reviewed the situation with the patient and her family. There was concern regarding end-of-life issues and it was felt that her respiratory status would worsen if she were not in sinus rhythm. Therefore, the decision was made to restart amiodarone and follow her closely. Her Coumadin was therapeutic and remained so. This will be followed as an outpatient.   She was initially admitted with CHF. She was initially diuresed with IV Lasix at 80 mg twice a day. However, her creatinine worsened and she was felt to  have acute on chronic renal failure. The Lasix was held until after the TEE cardioversion. It was then restarted because of dyspnea on exertion and hypoxia. She will be discharged on Lasix 80 mg twice a day. There was concern for recurrence of a pleural effusion, and her chest x-ray was rechecked. Results are below, but the effusion has not returned.   She had problems with hypotension because of her medications, including Toprol-XL. The dose was decreased and hypotension improved. Her left ventricular function is poor. She was able to continue receiving a low dose of her ARB.   She had significant generalized weakness. Initially the plan was to pursue physical therapy as she was felt the condition from her long history of heart failure. However, after a long discussion with Dr. Caryl Comes, the decision was made to pursue palliative care.   [name] was made a DO NOT RESUSCITATE. Palliative care with consult was called. She was seen by them and recommendations were made. They will continue to follow her as an outpatient. The emphasis will be on comfort care. She was started on Roxanol and Valium to help control symptoms.    She was having problems with incontinence and a foley catheter was placed. This will be left in place after d/c and she will follow up with primary care or Hospice MD for further management.    By 4/6, all arrangements have been made and no further inpatient workup is recommended. She was seen by Dr. Caryl Comes and by palliative care. All data were reviewed and discharge medications were outlined.  No further hospitalization is needed and she is considered stable for discharge, to follow up as an outpatient.    Labs:   Lab Results   Component  Value  Date     WBC  10.0  06/16/2013     HGB  10.3*  06/16/2013     HCT  32.0*  06/16/2013     MCV  93.0  06/16/2013     PLT  230  06/16/2013    Recent Labs Lab  06/16/13 0408   NA  136*   K  5.2   CL  97   CO2  27   BUN  69*   CREATININE   1.75*   CALCIUM  8.6   GLUCOSE  90    Pro B Natriuretic peptide (BNP)   Date/Time  Value  Ref Range  Status   06/05/2013 10:20 PM  13216.0*  0 - 450 pg/mL  Final   05/17/2012  6:55 AM  3145.0*  0 - 450 pg/mL  Final    Recent Labs   06/16/13 0408   INR  2.44*       Radiology: Dg Chest 1 View 06/11/2013   CLINICAL DATA:  80 year old female with cough. Initial encounter.  EXAM: CHEST - 1 VIEW  COMPARISON:  06/05/2013 and earlier.  FINDINGS: Portable AP semi upright view at 1003 hrs. Stable cardiomegaly and mediastinal contours. Stable left chest cardiac AICD. Bilateral pleural effusions more apparent. Appear small overall. No pneumothorax. Pulmonary vascularity is stable without overt edema. No definite consolidation.  IMPRESSION: Small but increased bilateral pleural effusions. No other acute cardiopulmonary abnormality identified.   Electronically Signed   By: Lars Pinks M.D.  On: 06/11/2013 12:42    Dg Chest 2 View 06/13/2013   CLINICAL DATA:  Shortness of breath.  EXAM: CHEST  2 VIEW  COMPARISON:  06/11/2013  FINDINGS: Patient's left-sided transvenous pacemaker/ AICD with leads overlying right atrium, right ventricle, and coronary sinus. The heart is enlarged. There is mild perihilar peribronchial thickening which appear stable. No focal consolidations, pleural effusions, or pulmonary edema. Bones appear osteopenic. Several thoracic wedge compression fractures are again noted. No suspicious lytic or blastic lesions are seen. There are chronic changes in both shoulders.  IMPRESSION: 1. Cardiomegaly without evidence for pulmonary edema. 2.  No focal acute pulmonary abnormality.   Electronically Signed   By: Shon Hale M.D.   On: 06/13/2013 09:06    Dg Chest 2 View 06/06/2013   CLINICAL DATA:  Atrial fibrillation. Shortness of breath. Cough and congestion.  EXAM: CHEST  2 VIEW  COMPARISON:  Chest x-ray 05/16/2012.  FINDINGS: Lung volumes are normal. No consolidative airspace disease. No pleural  effusions. No evidence of pulmonary edema. Mild pulmonary venous congestion. Mild cardiomegaly. Atherosclerosis in the thoracic aorta. Left-sided biventricular pacemaker/AICD with lead tips projecting over the expected location of the right atrium, overlying the lateral wall of the left ventricle via the coronary sinus and coronary veins, and in the right ventricular apex. Small calcified nodule in the lateral aspect of the right mid lung, similar to numerous prior examinations, most compatible with a calcified granuloma. Multiple old vertebral body compression fractures throughout the mid to lower thoracic spine, unchanged compared to prior study 06/10/2010.  IMPRESSION: 1. Mild cardiomegaly with mild pulmonary venous congestion, but no frank pulmonary edema. 2. Atherosclerosis.   Electronically Signed   By: Vinnie Langton M.D.   On: 06/06/2013 00:00      EKG: sinus rhythm with ventricular pacing,  some AV pacing and PVCs   Transesophageal echocardiogram: 06/09/2013 Study Conclusions - Left ventricle: The estimated ejection fraction was 15%. Diffuse hypokinesis. No evidence of thrombus. - Mitral valve: There was a vegetation. Severe regurgitation. - Left atrium: The atrium was severely dilated. No evidence of thrombus in the atrial cavity or appendage. - Right ventricle: Systolic function was moderately reduced. - Right atrium: The atrium was mildly to moderately dilated. - Atrial septum: There was an atrial septal aneurysm. - Tricuspid valve: Severe regurgitation. Impressions: - Limited study as patient developed hypotension and hypoxia during the procedure following sedation by anesthesia (required transient bagging and bolus of neosynephrine); therefore terminated prematurely. Severe LV dysfunction, no LAA thrombus; severe MR and TR. No cardiac source of emboli was indentified. Successful cardioversion.   Echo: 06/06/2013 Conclusions    - Left ventricle: The cavity size was  severely dilated. Wall thickness was normal. The estimated ejection fraction was 20%. Diffuse hypokinesis. - Aortic valve: Sclerosis without stenosis. Trivial regurgitation. - Mitral valve: Calcified annulus. Moderate regurgitation. - Left atrium: The atrium was severely dilated. - Right ventricle: The cavity size was mildly dilated. Pacer wire or catheter noted in right ventricle. Systolic function was mildly reduced. - Tricuspid valve: Mild regurgitation. - Pulmonary arteries: PA peak pressure: 32mm Hg (S).       FOLLOW UP PLANS AND APPOINTMENTS Allergies   Allergen  Reactions   .  Iodinated Diagnostic Agents         Unknown       Medication List              acetaminophen 325 MG tablet   Commonly known as:  TYLENOL   Take 650 mg by mouth every 6 (six) hours as needed. For pain/fever         amiodarone 200 MG tablet   Commonly known as:  PACERONE   Take 2 tablets (400 mg total) by mouth 2 (two) times daily. Take 2 tablets twice a day for 2 weeks, then 2 tablets daily         azelastine 0.05 % ophthalmic solution   Commonly known as:  OPTIVAR   Place 1 drop into both eyes 2 (two) times daily.         benzonatate 200 MG capsule   Commonly known as:  TESSALON   Take 1 capsule (200 mg total) by mouth 3 (three) times daily as needed for cough.         bisacodyl 10 MG suppository   Commonly known as:  DULCOLAX   Place 1 suppository (10 mg total) rectally daily as needed for moderate constipation.         budesonide-formoterol 160-4.5 MCG/ACT inhaler   Commonly known as:  SYMBICORT   Inhale 2 puffs into the lungs daily.         citalopram 10 MG tablet   Commonly known as:  CELEXA   Take 1 tablet (10 mg total) by mouth daily.         desloratadine 5 MG tablet   Commonly known as:  CLARINEX   Take 5 mg by mouth daily as needed (for allergies).         dextromethorphan 30 MG/5ML liquid   Commonly known as:  DELSYM   Take 2.5 mLs (15 mg total) by mouth 2  (two) times daily.         diazepam 2 MG tablet   Commonly known as:  VALIUM   Take 1 tablet (2 mg total)  by mouth every 12 (twelve) hours as needed for anxiety or muscle spasms (sleep).         EYE VITAMINS Caps   Take by mouth. i caps 2 tabs a day         fish oil-omega-3 fatty acids 1000 MG capsule   Take 1 g by mouth daily.         furosemide 40 MG tablet   Commonly known as:  LASIX   Take 2 tablets (80 mg total) by mouth 2 (two) times daily.         irbesartan 75 MG tablet   Commonly known as:  AVAPRO   Take 0.5 tablets (37.5 mg total) by mouth daily.         lansoprazole 30 MG capsule   Commonly known as:  PREVACID   Take 30 mg by mouth daily.         lidocaine 5 %   Commonly known as:  LIDODERM   Place 2 patches onto the skin daily. Remove & Discard patch within 12 hours or as directed by MD         metoprolol succinate 25 MG 24 hr tablet   Commonly known as:  TOPROL-XL   Take 1 tablet (25 mg total) by mouth daily. Take with or immediately following a meal.         morphine CONCENTRATE 10 mg / 0.5 ml concentrated solution   Take 0.25 mLs (5 mg total) by mouth every 4 (four) hours as needed for moderate pain or shortness of breath.         rOPINIRole 1 MG tablet   Commonly known as:  REQUIP   Take 1 tablet (1 mg total) by mouth daily.         SPIRIVA HANDIHALER IN   Inhale 18 mcg into the lungs daily.         Vitamin D (Ergocalciferol) 50000 UNITS Caps capsule   Commonly known as:  DRISDOL   Take 50,000 Units by mouth every 7 (seven) days. Tuesday         warfarin 3 MG tablet   Commonly known as:  COUMADIN   Take 1 tablet (3 mg total) by mouth daily.                Future Appointments  Provider  Department  Dept Phone     06/24/2013 3:45 PM  Deboraha Sprang, MD  [facility] Office  [phone]       Follow-up Information     Follow up with Hospice and [place]. Occupational hygienist)      Sport and exercise psychologist information:      [place] [id number] [phone]         BRING ALL MEDICATIONS WITH YOU TO FOLLOW UP APPOINTMENTS   Time spent with patient to include physician time: 48 min Signed: Rosaria Ferries, PA-C 06/16/2013, 10:08 AM Co-Sign MD     Saw pt independtly this am Renal function was stable She is not ambulating much, BP is low Her desire is to go h ome with foley

## 2013-07-03 NOTE — Telephone Encounter (Signed)
PA approved for lidocaine patch through [employer] [facility] until 2099.

## 2013-09-03 ENCOUNTER — Telehealth: Payer: Self-pay | Admitting: Internal Medicine

## 2013-09-03 NOTE — Telephone Encounter (Signed)
LMOVM to let know ppm is never turned off and that it is not prolonging pt's life.

## 2013-09-03 NOTE — Telephone Encounter (Signed)
New message          Pt is on hospice care and family was asking about having pacemaker turned off, is this a possibility? / pt is at the end of life transitioning to the other side, is the pacemaker prolonging her life?

## 2013-09-03 NOTE — Telephone Encounter (Signed)
Will forward to Dr Caryl Comes and [facility]

## 2013-09-10 DEATH — deceased

## 2013-10-31 IMAGING — RF DG ESOPHAGUS
15 of 20 series · 15 of 20 positions shown · non-contrast
Comparison: None.

CLINICAL DATA: Difficulty swallowing.  History of stricture and
dilatations.

ESOPHOGRAM/BARIUM SWALLOW
TECHNIQUE: Single contrast examination was performed using thin
barium.
Fluoroscopy time:  3.12 minutes.

[Series 1: run · 1 of 1 slices shown (1 of 15)]
[im 1/1]
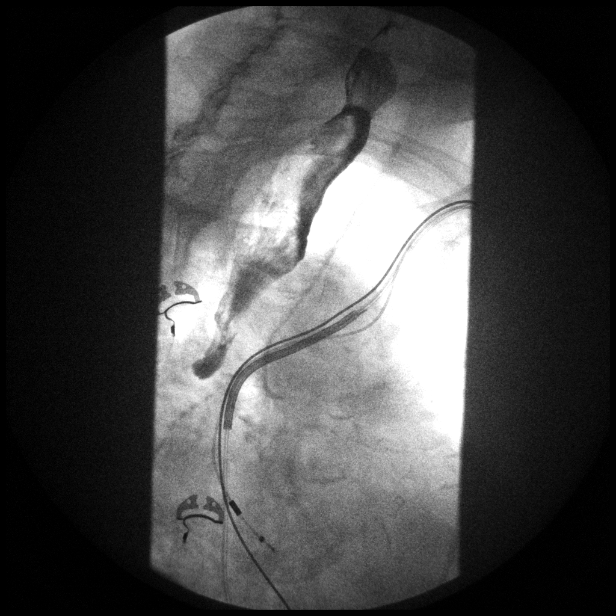

[Series 3: run · 1 of 1 slices shown (2 of 15)]
[im 1/1]
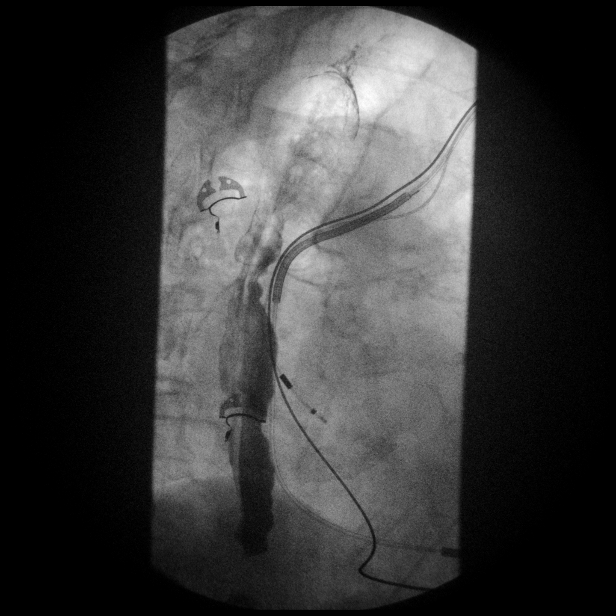

[Series 4: run · 1 of 1 slices shown (3 of 15)]
[im 1/1]
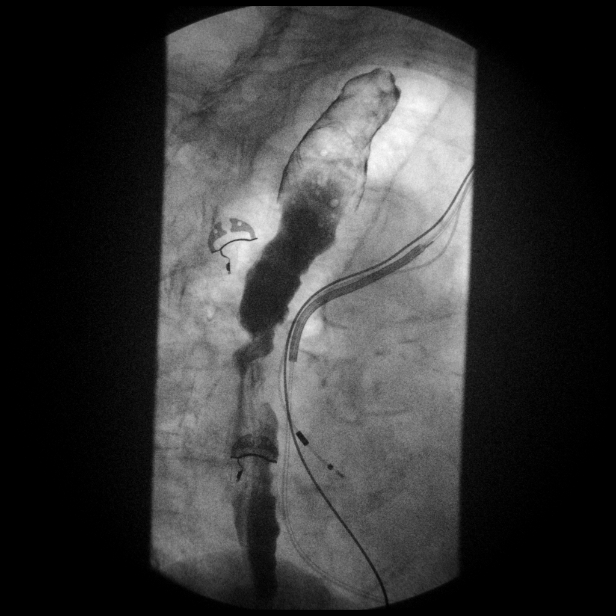

[Series 5: run · 1 of 1 slices shown (4 of 15)]
[im 1/1]
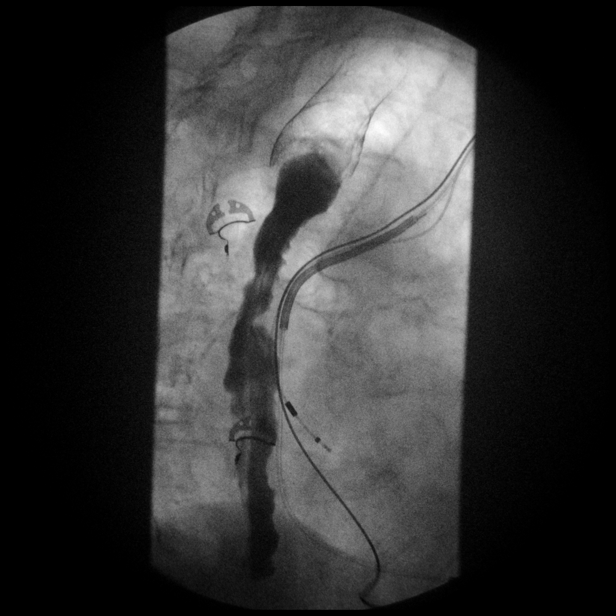

[Series 7: run · 1 of 1 slices shown (5 of 15)]
[im 1/1]
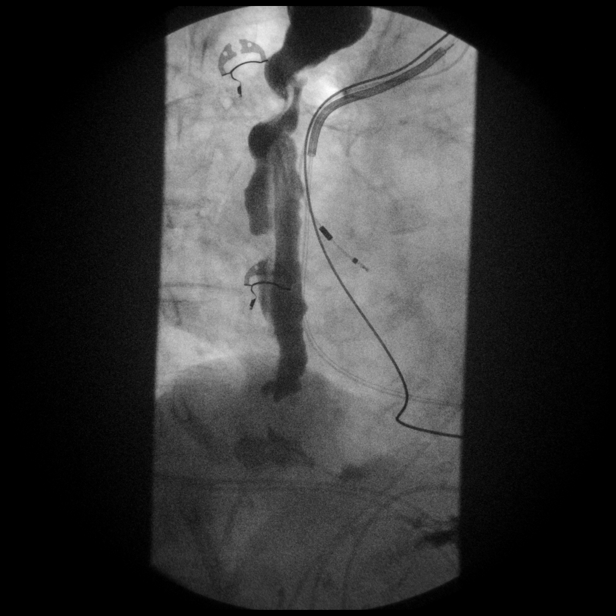

[Series 8: run · 1 of 1 slices shown (6 of 15)]
[im 1/1]
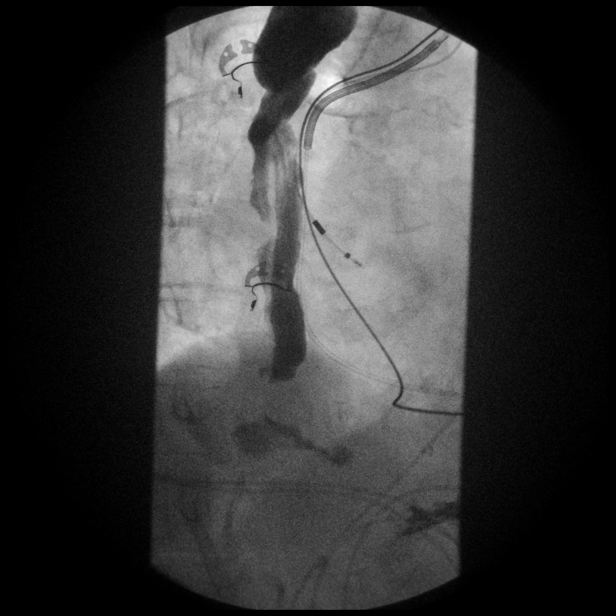

[Series 9: run · 1 of 1 slices shown (7 of 15)]
[im 1/1]
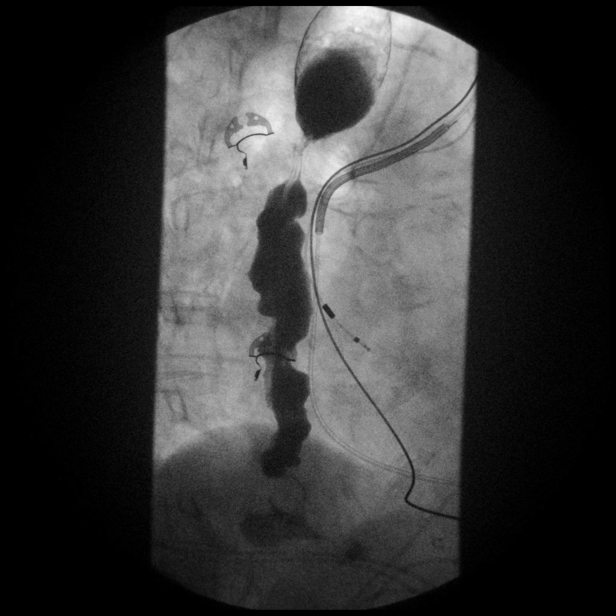

[Series 11: run · 1 of 1 slices shown (8 of 15)]
[im 1/1]
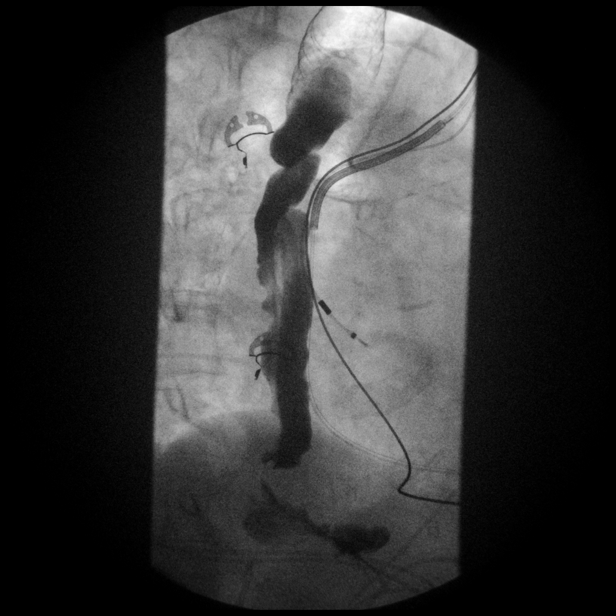

[Series 12: run · 1 of 1 slices shown (9 of 15)]
[im 1/1]
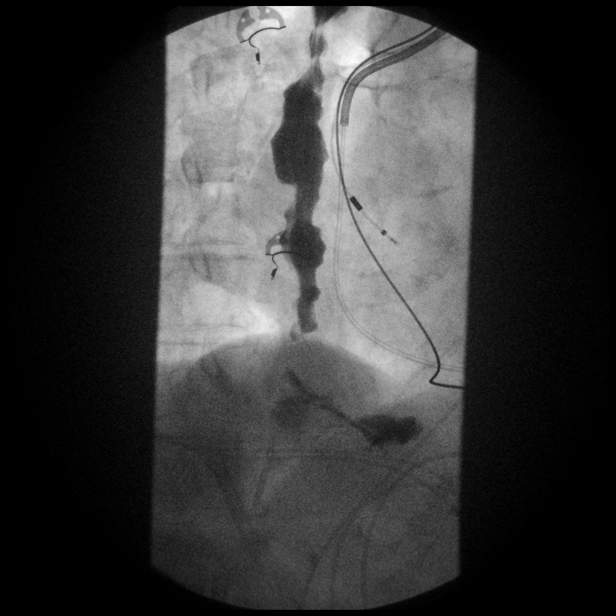

[Series 13: run · 1 of 1 slices shown (10 of 15)]
[im 1/1]
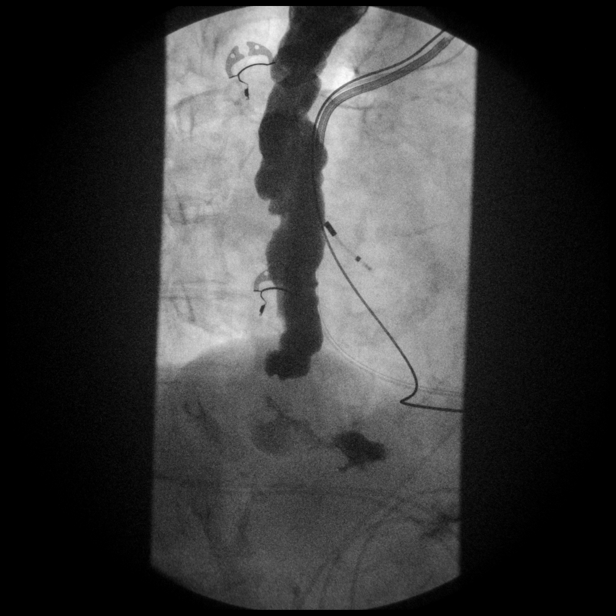

[Series 15: run · 1 of 1 slices shown (11 of 15)]
[im 1/1]
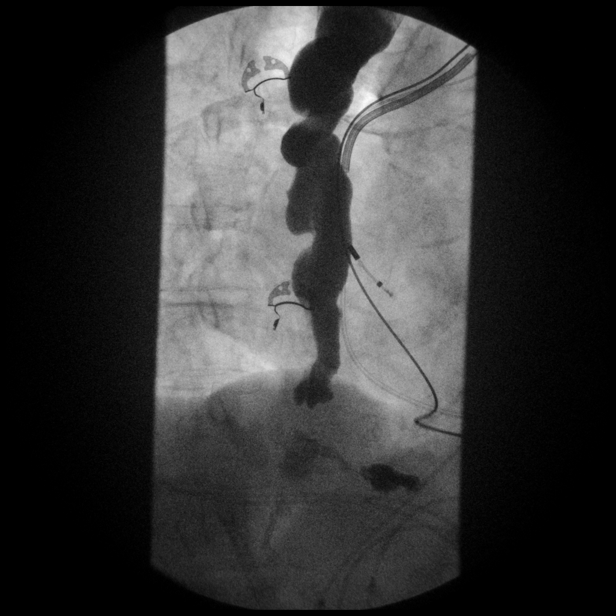

[Series 16: run · 1 of 1 slices shown (12 of 15)]
[im 1/1]
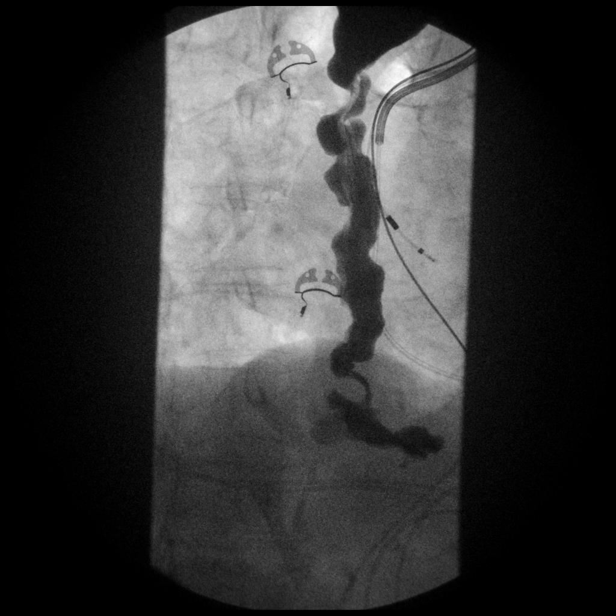

[Series 17: run · 1 of 1 slices shown (13 of 15)]
[im 1/1]
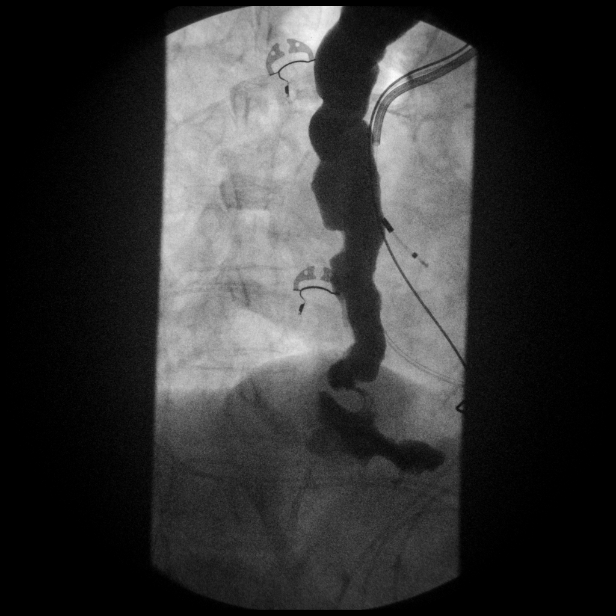

[Series 19: run · 1 of 1 slices shown (14 of 15)]
[im 1/1]
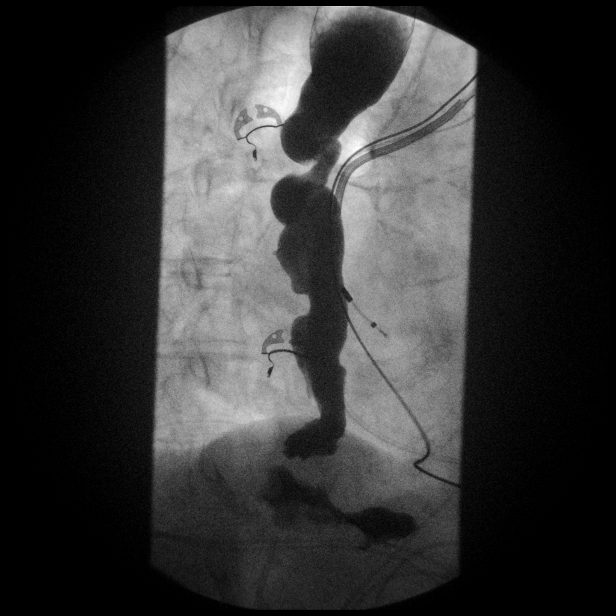

[Series 20: run · 1 of 1 slices shown (15 of 15)]
[im 1/1]
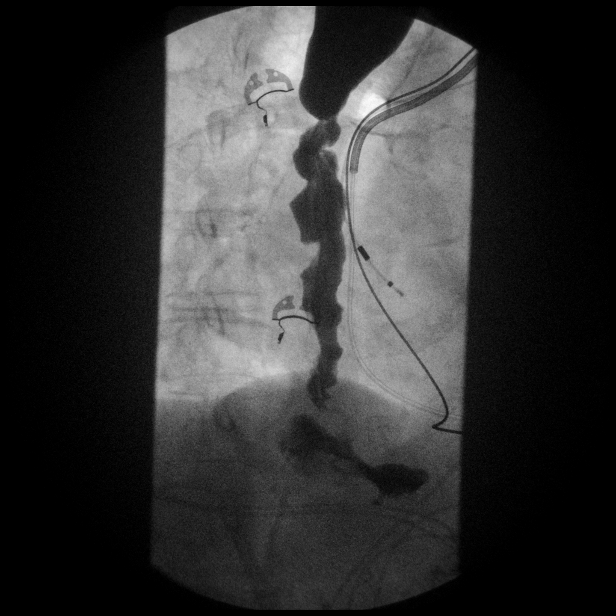

[15 of 20 positions shown; findings below may reference images not displayed]

FINDINGS: Severe esophageal dysmotility with a corkscrew
appearance and extensive esophageal spasm with significant
esophageal stasis.  There appears to be a tight stricture involving
the distal esophagus but very little contrast went through and into
the stomach despite elevation of the patient and using iced barium.
IMPRESSION: 1.  Severe esophageal dysmotility with diffuse esophageal spasm and
a corkscrew appearance.  Significant esophageal stasis.
2.  Suspect a tight distal esophageal stricture but very little
barium went into the stomach.

## 2014-10-29 ENCOUNTER — Encounter: Payer: Self-pay | Admitting: Internal Medicine

## 2014-11-27 IMAGING — CR DG CHEST 2V
2 series · 2 of 2 positions shown · non-contrast
Comparison: 06/11/2013

CLINICAL DATA: Shortness of breath.

EXAM:
CHEST  2 VIEW

[w chest lat]
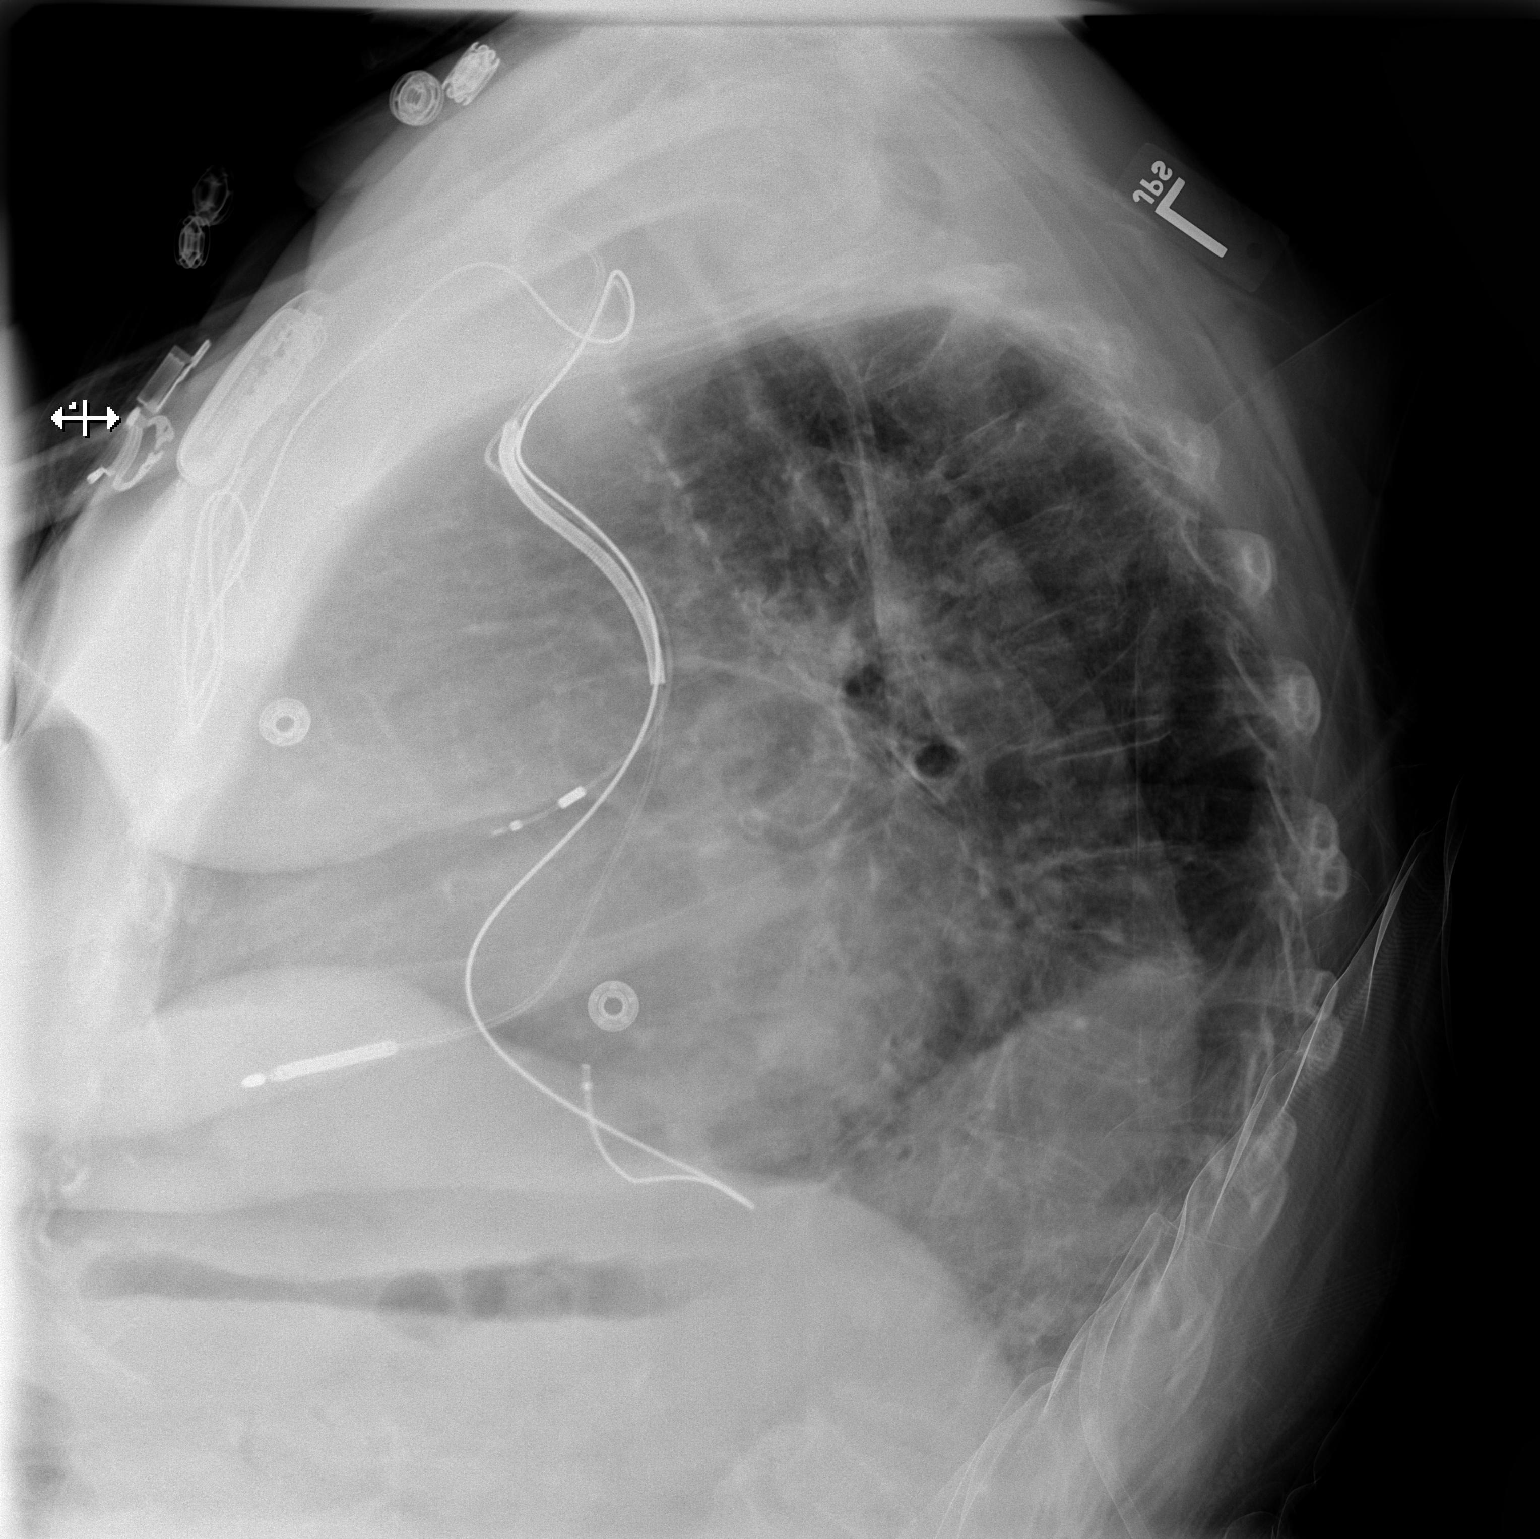

[x chest ap]
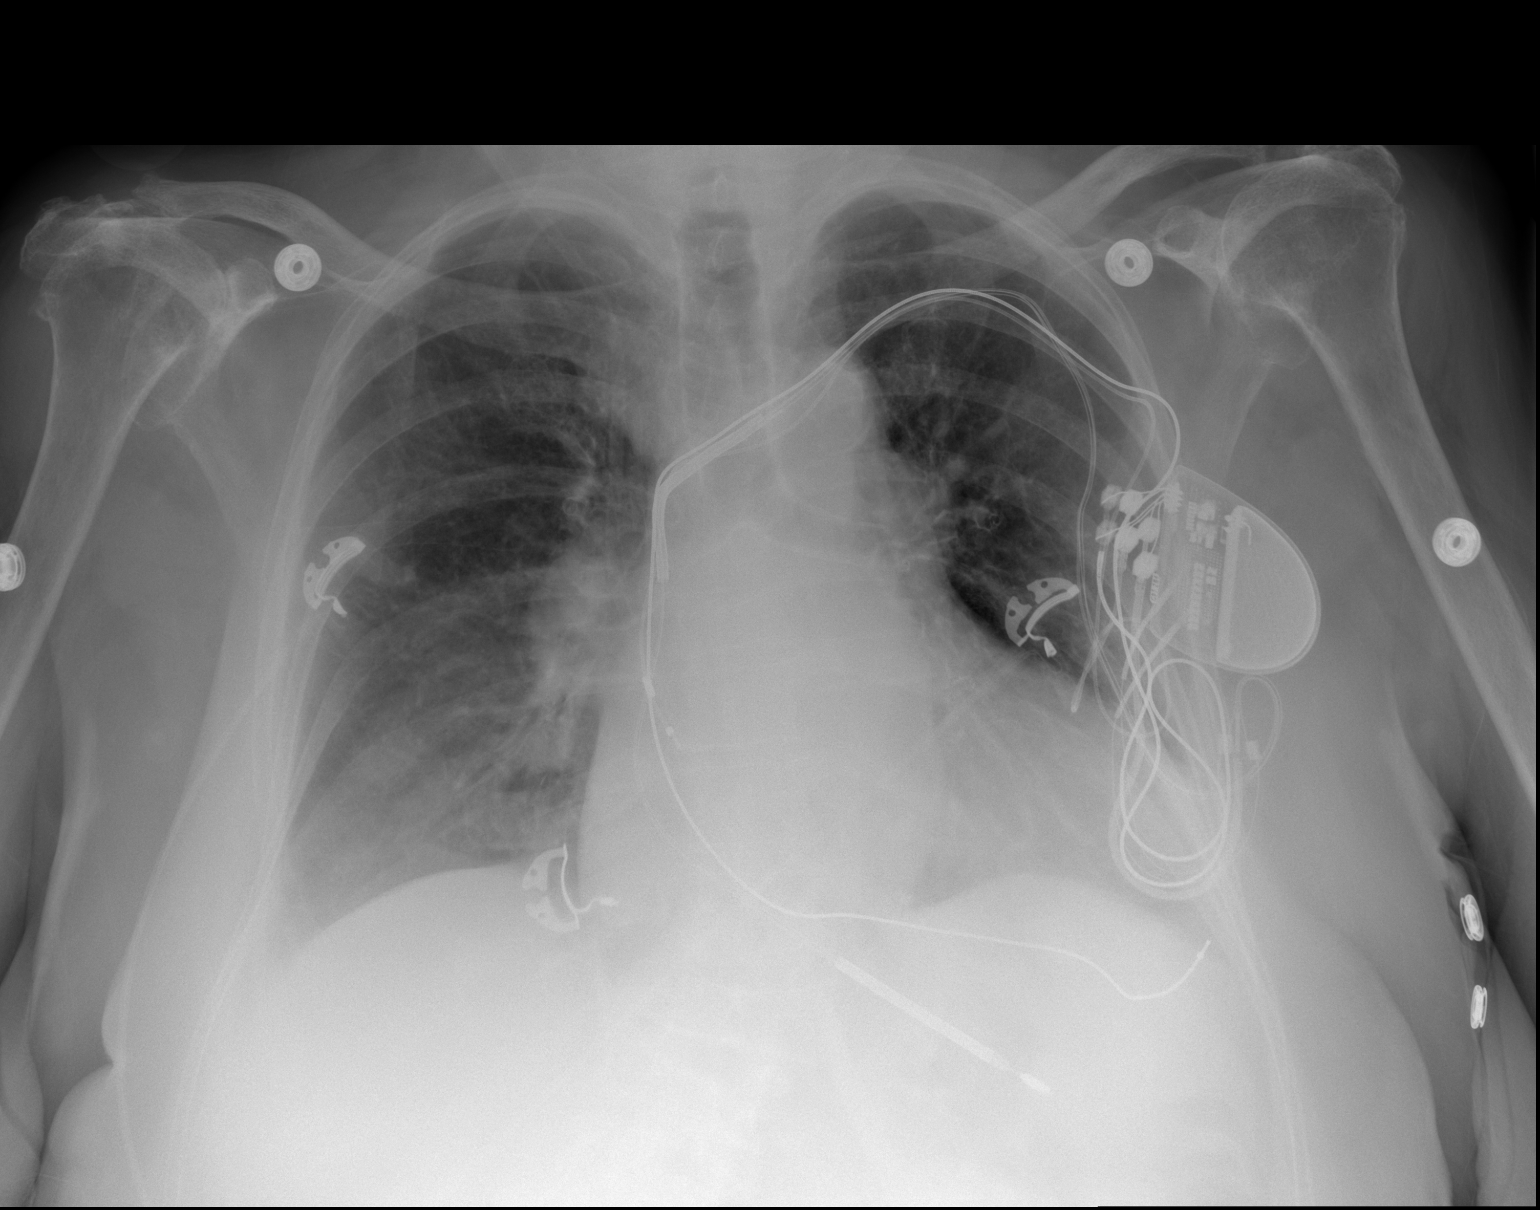

[2 of 2 positions shown; findings below may reference images not displayed]

FINDINGS: Patient's left-sided transvenous pacemaker/ AICD with leads
overlying right atrium, right ventricle, and coronary sinus. The
heart is enlarged. There is mild perihilar peribronchial thickening
which appear stable. No focal consolidations, pleural effusions, or
pulmonary edema. Bones appear osteopenic. Several thoracic wedge
compression fractures are again noted. No suspicious lytic or
blastic lesions are seen. There are chronic changes in both
shoulders.
IMPRESSION: 1. Cardiomegaly without evidence for pulmonary edema.
2.  No focal acute pulmonary abnormality.
